# Patient Record
Sex: Male | Born: 1935 | Race: White | Hispanic: No | Marital: Married | Smoking: Former smoker
Health system: Southern US, Community
[De-identification: ages and names within clinical notes are randomized; demographics above are authoritative.]

## PROBLEM LIST (undated history)

## (undated) DIAGNOSIS — R269 Unspecified abnormalities of gait and mobility: Secondary | ICD-10-CM

## (undated) DIAGNOSIS — E538 Deficiency of other specified B group vitamins: Secondary | ICD-10-CM

## (undated) DIAGNOSIS — K2970 Gastritis, unspecified, without bleeding: Secondary | ICD-10-CM

## (undated) DIAGNOSIS — R569 Unspecified convulsions: Secondary | ICD-10-CM

## (undated) DIAGNOSIS — G629 Polyneuropathy, unspecified: Secondary | ICD-10-CM

## (undated) DIAGNOSIS — L259 Unspecified contact dermatitis, unspecified cause: Secondary | ICD-10-CM

## (undated) DIAGNOSIS — G20 Parkinson's disease: Secondary | ICD-10-CM

## (undated) DIAGNOSIS — D126 Benign neoplasm of colon, unspecified: Secondary | ICD-10-CM

## (undated) DIAGNOSIS — M1610 Unilateral primary osteoarthritis, unspecified hip: Secondary | ICD-10-CM

## (undated) DIAGNOSIS — N400 Enlarged prostate without lower urinary tract symptoms: Secondary | ICD-10-CM

## (undated) DIAGNOSIS — I2699 Other pulmonary embolism without acute cor pulmonale: Secondary | ICD-10-CM

## (undated) DIAGNOSIS — K2990 Gastroduodenitis, unspecified, without bleeding: Secondary | ICD-10-CM

## (undated) DIAGNOSIS — K649 Unspecified hemorrhoids: Secondary | ICD-10-CM

## (undated) DIAGNOSIS — I951 Orthostatic hypotension: Secondary | ICD-10-CM

## (undated) DIAGNOSIS — D649 Anemia, unspecified: Secondary | ICD-10-CM

## (undated) DIAGNOSIS — L719 Rosacea, unspecified: Secondary | ICD-10-CM

## (undated) DIAGNOSIS — E559 Vitamin D deficiency, unspecified: Secondary | ICD-10-CM

## (undated) DIAGNOSIS — L97509 Non-pressure chronic ulcer of other part of unspecified foot with unspecified severity: Secondary | ICD-10-CM

## (undated) DIAGNOSIS — M169 Osteoarthritis of hip, unspecified: Secondary | ICD-10-CM

## (undated) DIAGNOSIS — K5900 Constipation, unspecified: Secondary | ICD-10-CM

## (undated) DIAGNOSIS — K219 Gastro-esophageal reflux disease without esophagitis: Secondary | ICD-10-CM

## (undated) DIAGNOSIS — G589 Mononeuropathy, unspecified: Secondary | ICD-10-CM

## (undated) DIAGNOSIS — I10 Essential (primary) hypertension: Secondary | ICD-10-CM

## (undated) DIAGNOSIS — M479 Spondylosis, unspecified: Secondary | ICD-10-CM

## (undated) DIAGNOSIS — E7800 Pure hypercholesterolemia, unspecified: Secondary | ICD-10-CM

## (undated) DIAGNOSIS — R55 Syncope and collapse: Secondary | ICD-10-CM

## (undated) DIAGNOSIS — K5730 Diverticulosis of large intestine without perforation or abscess without bleeding: Secondary | ICD-10-CM

## (undated) HISTORY — DX: Rosacea, unspecified: L71.9

## (undated) HISTORY — DX: Orthostatic hypotension: I95.1

## (undated) HISTORY — DX: Pure hypercholesterolemia, unspecified: E78.00

## (undated) HISTORY — DX: Parkinson's disease: G20

## (undated) HISTORY — DX: Other pulmonary embolism without acute cor pulmonale: I26.99

## (undated) HISTORY — DX: Unspecified contact dermatitis, unspecified cause: L25.9

## (undated) HISTORY — DX: Benign neoplasm of colon, unspecified: D12.6

## (undated) HISTORY — DX: Unilateral primary osteoarthritis, unspecified hip: M16.10

## (undated) HISTORY — DX: Polyneuropathy, unspecified: G62.9

## (undated) HISTORY — DX: Essential (primary) hypertension: I10

## (undated) HISTORY — DX: Gastro-esophageal reflux disease without esophagitis: K21.9

## (undated) HISTORY — DX: Unspecified hemorrhoids: K64.9

## (undated) HISTORY — DX: Gastroduodenitis, unspecified, without bleeding: K29.90

## (undated) HISTORY — DX: Gastritis, unspecified, without bleeding: K29.70

## (undated) HISTORY — DX: Unspecified convulsions: R56.9

## (undated) HISTORY — DX: Diverticulosis of large intestine without perforation or abscess without bleeding: K57.30

## (undated) HISTORY — DX: Spondylosis, unspecified: M47.9

## (undated) HISTORY — DX: Non-pressure chronic ulcer of other part of unspecified foot with unspecified severity: L97.509

## (undated) HISTORY — DX: Unspecified abnormalities of gait and mobility: R26.9

## (undated) HISTORY — DX: Deficiency of other specified B group vitamins: E53.8

## (undated) HISTORY — DX: Vitamin D deficiency, unspecified: E55.9

## (undated) HISTORY — DX: Benign prostatic hyperplasia without lower urinary tract symptoms: N40.0

## (undated) HISTORY — DX: Constipation, unspecified: K59.00

## (undated) HISTORY — DX: Osteoarthritis of hip, unspecified: M16.9

## (undated) HISTORY — DX: Anemia, unspecified: D64.9

## (undated) HISTORY — DX: Mononeuropathy, unspecified: G58.9

## (undated) HISTORY — DX: Syncope and collapse: R55

---
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
## 1963-08-21 HISTORY — PX: OTHER SURGICAL HISTORY: SHX169

## 2001-08-20 HISTORY — PX: CHOLECYSTECTOMY, LAPAROSCOPIC: SHX56

## 2006-08-29 DIAGNOSIS — K2970 Gastritis, unspecified, without bleeding: Secondary | ICD-10-CM

## 2006-08-29 DIAGNOSIS — K2990 Gastroduodenitis, unspecified, without bleeding: Secondary | ICD-10-CM

## 2006-08-29 HISTORY — PX: COLONOSCOPY: SHX174

## 2006-08-29 HISTORY — DX: Gastritis, unspecified, without bleeding: K29.70

## 2006-08-29 HISTORY — DX: Gastritis, unspecified, without bleeding: K29.90

## 2011-12-14 DIAGNOSIS — H43819 Vitreous degeneration, unspecified eye: Secondary | ICD-10-CM | POA: Diagnosis not present

## 2011-12-14 DIAGNOSIS — H2510 Age-related nuclear cataract, unspecified eye: Secondary | ICD-10-CM | POA: Diagnosis not present

## 2012-01-01 DIAGNOSIS — K219 Gastro-esophageal reflux disease without esophagitis: Secondary | ICD-10-CM | POA: Diagnosis not present

## 2012-01-01 DIAGNOSIS — E7800 Pure hypercholesterolemia, unspecified: Secondary | ICD-10-CM | POA: Diagnosis not present

## 2012-01-01 DIAGNOSIS — D126 Benign neoplasm of colon, unspecified: Secondary | ICD-10-CM | POA: Diagnosis not present

## 2012-01-01 DIAGNOSIS — Z01818 Encounter for other preprocedural examination: Secondary | ICD-10-CM | POA: Diagnosis not present

## 2012-01-02 DIAGNOSIS — H2510 Age-related nuclear cataract, unspecified eye: Secondary | ICD-10-CM | POA: Diagnosis not present

## 2012-01-07 DIAGNOSIS — H25049 Posterior subcapsular polar age-related cataract, unspecified eye: Secondary | ICD-10-CM | POA: Diagnosis not present

## 2012-01-07 DIAGNOSIS — H2510 Age-related nuclear cataract, unspecified eye: Secondary | ICD-10-CM | POA: Diagnosis not present

## 2012-01-07 DIAGNOSIS — H25019 Cortical age-related cataract, unspecified eye: Secondary | ICD-10-CM | POA: Diagnosis not present

## 2012-01-07 HISTORY — PX: CATARACT EXTRACTION W/ INTRAOCULAR LENS IMPLANT: SHX1309

## 2012-01-29 DIAGNOSIS — G589 Mononeuropathy, unspecified: Secondary | ICD-10-CM | POA: Diagnosis not present

## 2012-01-29 DIAGNOSIS — K2970 Gastritis, unspecified, without bleeding: Secondary | ICD-10-CM | POA: Diagnosis not present

## 2012-01-29 DIAGNOSIS — E7800 Pure hypercholesterolemia, unspecified: Secondary | ICD-10-CM | POA: Diagnosis not present

## 2012-01-29 DIAGNOSIS — E559 Vitamin D deficiency, unspecified: Secondary | ICD-10-CM | POA: Diagnosis not present

## 2012-01-29 DIAGNOSIS — Z23 Encounter for immunization: Secondary | ICD-10-CM | POA: Diagnosis not present

## 2012-01-30 DIAGNOSIS — R7989 Other specified abnormal findings of blood chemistry: Secondary | ICD-10-CM | POA: Diagnosis not present

## 2012-01-30 DIAGNOSIS — G589 Mononeuropathy, unspecified: Secondary | ICD-10-CM | POA: Diagnosis not present

## 2012-01-30 DIAGNOSIS — E7800 Pure hypercholesterolemia, unspecified: Secondary | ICD-10-CM | POA: Diagnosis not present

## 2012-01-30 DIAGNOSIS — E559 Vitamin D deficiency, unspecified: Secondary | ICD-10-CM | POA: Diagnosis not present

## 2012-04-30 DIAGNOSIS — G609 Hereditary and idiopathic neuropathy, unspecified: Secondary | ICD-10-CM | POA: Diagnosis not present

## 2012-05-01 DIAGNOSIS — I669 Occlusion and stenosis of unspecified cerebral artery: Secondary | ICD-10-CM | POA: Diagnosis not present

## 2012-05-01 DIAGNOSIS — G609 Hereditary and idiopathic neuropathy, unspecified: Secondary | ICD-10-CM | POA: Diagnosis not present

## 2012-05-05 DIAGNOSIS — D235 Other benign neoplasm of skin of trunk: Secondary | ICD-10-CM | POA: Diagnosis not present

## 2012-05-23 DIAGNOSIS — H2510 Age-related nuclear cataract, unspecified eye: Secondary | ICD-10-CM | POA: Diagnosis not present

## 2012-06-02 DIAGNOSIS — G609 Hereditary and idiopathic neuropathy, unspecified: Secondary | ICD-10-CM | POA: Diagnosis not present

## 2012-06-06 DIAGNOSIS — Z23 Encounter for immunization: Secondary | ICD-10-CM | POA: Diagnosis not present

## 2012-07-07 DIAGNOSIS — G609 Hereditary and idiopathic neuropathy, unspecified: Secondary | ICD-10-CM | POA: Diagnosis not present

## 2013-01-20 DIAGNOSIS — R030 Elevated blood-pressure reading, without diagnosis of hypertension: Secondary | ICD-10-CM | POA: Diagnosis not present

## 2013-01-20 DIAGNOSIS — R55 Syncope and collapse: Secondary | ICD-10-CM | POA: Diagnosis not present

## 2013-01-20 DIAGNOSIS — G589 Mononeuropathy, unspecified: Secondary | ICD-10-CM | POA: Diagnosis not present

## 2013-01-20 DIAGNOSIS — E559 Vitamin D deficiency, unspecified: Secondary | ICD-10-CM | POA: Diagnosis not present

## 2013-01-21 DIAGNOSIS — G589 Mononeuropathy, unspecified: Secondary | ICD-10-CM | POA: Diagnosis not present

## 2013-01-21 DIAGNOSIS — D649 Anemia, unspecified: Secondary | ICD-10-CM | POA: Diagnosis not present

## 2013-01-21 DIAGNOSIS — E559 Vitamin D deficiency, unspecified: Secondary | ICD-10-CM | POA: Diagnosis not present

## 2013-01-21 DIAGNOSIS — E7800 Pure hypercholesterolemia, unspecified: Secondary | ICD-10-CM | POA: Diagnosis not present

## 2013-01-21 DIAGNOSIS — R55 Syncope and collapse: Secondary | ICD-10-CM | POA: Diagnosis not present

## 2013-01-23 DIAGNOSIS — Z130 Encounter for screening for diseases of the blood and blood-forming organs and certain disorders involving the immune mechanism: Secondary | ICD-10-CM | POA: Diagnosis not present

## 2013-01-28 DIAGNOSIS — R55 Syncope and collapse: Secondary | ICD-10-CM | POA: Diagnosis not present

## 2013-01-28 DIAGNOSIS — I6789 Other cerebrovascular disease: Secondary | ICD-10-CM | POA: Diagnosis not present

## 2013-02-24 DIAGNOSIS — R030 Elevated blood-pressure reading, without diagnosis of hypertension: Secondary | ICD-10-CM | POA: Diagnosis not present

## 2013-02-24 DIAGNOSIS — R42 Dizziness and giddiness: Secondary | ICD-10-CM | POA: Diagnosis not present

## 2013-04-02 DIAGNOSIS — D649 Anemia, unspecified: Secondary | ICD-10-CM | POA: Diagnosis not present

## 2013-04-02 DIAGNOSIS — E559 Vitamin D deficiency, unspecified: Secondary | ICD-10-CM | POA: Diagnosis not present

## 2013-04-16 DIAGNOSIS — R269 Unspecified abnormalities of gait and mobility: Secondary | ICD-10-CM | POA: Diagnosis not present

## 2013-04-16 DIAGNOSIS — G609 Hereditary and idiopathic neuropathy, unspecified: Secondary | ICD-10-CM | POA: Diagnosis not present

## 2013-04-21 DIAGNOSIS — Z23 Encounter for immunization: Secondary | ICD-10-CM | POA: Diagnosis not present

## 2013-05-25 DIAGNOSIS — H2510 Age-related nuclear cataract, unspecified eye: Secondary | ICD-10-CM | POA: Diagnosis not present

## 2013-05-25 DIAGNOSIS — H26499 Other secondary cataract, unspecified eye: Secondary | ICD-10-CM | POA: Diagnosis not present

## 2013-05-25 DIAGNOSIS — H43819 Vitreous degeneration, unspecified eye: Secondary | ICD-10-CM | POA: Diagnosis not present

## 2013-05-28 DIAGNOSIS — R030 Elevated blood-pressure reading, without diagnosis of hypertension: Secondary | ICD-10-CM | POA: Diagnosis not present

## 2013-05-28 DIAGNOSIS — G589 Mononeuropathy, unspecified: Secondary | ICD-10-CM | POA: Diagnosis not present

## 2013-05-28 DIAGNOSIS — K219 Gastro-esophageal reflux disease without esophagitis: Secondary | ICD-10-CM | POA: Diagnosis not present

## 2013-06-16 DIAGNOSIS — R269 Unspecified abnormalities of gait and mobility: Secondary | ICD-10-CM | POA: Diagnosis not present

## 2013-06-16 DIAGNOSIS — G609 Hereditary and idiopathic neuropathy, unspecified: Secondary | ICD-10-CM | POA: Diagnosis not present

## 2014-01-20 ENCOUNTER — Ambulatory Visit: Payer: Medicare Other | Admitting: Nurse Practitioner

## 2014-02-02 ENCOUNTER — Encounter: Payer: Self-pay | Admitting: Internal Medicine

## 2014-02-02 ENCOUNTER — Non-Acute Institutional Stay: Payer: Medicare Other | Admitting: Internal Medicine

## 2014-02-02 VITALS — BP 140/76 | HR 65 | Temp 99.2°F | Ht 67.5 in | Wt 144.0 lb

## 2014-02-02 DIAGNOSIS — I10 Essential (primary) hypertension: Secondary | ICD-10-CM | POA: Diagnosis not present

## 2014-02-02 DIAGNOSIS — E538 Deficiency of other specified B group vitamins: Secondary | ICD-10-CM

## 2014-02-02 DIAGNOSIS — D126 Benign neoplasm of colon, unspecified: Secondary | ICD-10-CM

## 2014-02-02 DIAGNOSIS — K219 Gastro-esophageal reflux disease without esophagitis: Secondary | ICD-10-CM | POA: Diagnosis not present

## 2014-02-02 DIAGNOSIS — E7800 Pure hypercholesterolemia, unspecified: Secondary | ICD-10-CM | POA: Insufficient documentation

## 2014-02-02 DIAGNOSIS — G629 Polyneuropathy, unspecified: Secondary | ICD-10-CM | POA: Insufficient documentation

## 2014-02-02 DIAGNOSIS — D649 Anemia, unspecified: Secondary | ICD-10-CM | POA: Insufficient documentation

## 2014-02-02 DIAGNOSIS — G20 Parkinson's disease: Secondary | ICD-10-CM | POA: Insufficient documentation

## 2014-02-02 DIAGNOSIS — K5900 Constipation, unspecified: Secondary | ICD-10-CM

## 2014-02-02 DIAGNOSIS — G609 Hereditary and idiopathic neuropathy, unspecified: Secondary | ICD-10-CM | POA: Diagnosis not present

## 2014-02-02 DIAGNOSIS — R259 Unspecified abnormal involuntary movements: Secondary | ICD-10-CM

## 2014-02-02 DIAGNOSIS — N400 Enlarged prostate without lower urinary tract symptoms: Secondary | ICD-10-CM | POA: Diagnosis not present

## 2014-02-02 DIAGNOSIS — E559 Vitamin D deficiency, unspecified: Secondary | ICD-10-CM | POA: Insufficient documentation

## 2014-02-02 NOTE — Progress Notes (Signed)
Patient ID: Lawrence Turner, male   DOB: [birth year], 80 y.o.   MRN: [id number]    Location:  [facility] , independent  Place of Service: Clinic [phone])  Extended Emergency Contact Information Primary Emergency Contact: Lawrence Turner Address: [address]           [place],  [address] Lawrence Turner of [place] Phone: [phone] Relation: Spouse   Chief Complaint  Patient presents with  . Medical Management of Chronic Issues    New Patient, just moved to [facility] 01/2014 from [place] with wife.  . Peripheral Neuropathy    has some concerns, would like to talk with Dr. Nyoka Turner about    HPI:  Unspecified essential hypertension: Under control at the present time. No medications. Does not follow any particular diet.  Esophageal reflux: Occasional indigestion. Using pantoprazole daily.  Peripheral neuropathy: Symptoms present since 2000. He has been told by multiple doctors that there is no particular treatment for this. Patient did have peripheral nerve conduction velocity testing done 12/06/11 it showed sensory and motor neuropathy. He has been tried on Lyrica. He stopped this after several months because of feeling "wiped out". Gabapentin this a little bit effective. He is on a low dose of this. He also uses 3-5 breakthrough both triglycerides and because it was mentioned in the study as possibly helping diabetic neuropathy. Patient is not diabetic. Patient felt a neuropathy might now be affecting his handwriting. He seems to be losing his motor skills. His feet are painful virtually no time. Hands feel a bit numb, but are not painful.  Hypertrophy of prostate without urinary obstruction and other lower urinary tract symptoms (LUTS): Denies any problems with hesitation or dribbling.  Other B-complex deficiencies: Patient reports a low B12. He is unaware whether he ever had lab work done for this. He takes B12 supplements orally.  Anemia, unspecified: Remote  history.  Unspecified vitamin D deficiency: Patient supplementing with vitamin D.  Pure hypercholesterolemia: Using fenofibrate for elevated triglycerides.  Benign neoplasm of colon: History of polyp removal in 2005. Patient was told he should have followup colonoscopy but has never had this done. He does not want to do this at the present time because of stresses involved with this move to [place].  Unspecified constipation: Mild to moderate. He has been using MiraLax. He does not find this particularly satisfactory.  Unstable gait: Patient says he is feeling much less steady when walking. He denies any falls.    Past Medical History  Diagnosis Date  . Mononeuritis of unspecified site   . Unspecified constipation   . Unspecified hemorrhoids without mention of complication   . Benign neoplasm of colon     pre-cancerous polyps removed at colonoscopy 2005  . Pure hypercholesterolemia   . Unspecified vitamin D deficiency   . Unspecified gastritis and gastroduodenitis without mention of hemorrhage   . Other pulmonary embolism and infarction     following cholecystectomy   . Anemia, unspecified   . Hypertrophy of prostate without urinary obstruction and other lower urinary tract symptoms (LUTS)   . Other B-complex deficiencies   . 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 Rosacea   . Osteoarthrosis, unspecified whether generalized or localized, pelvic region and thigh   . Diverticulosis of colon (without mention of hemorrhage)   . Contact dermatitis and other eczema, due to unspecified cause   . Esophageal reflux   . Unspecified essential hypertension   . Peripheral neuropathy  Past Surgical History  Procedure Laterality Date  . Hemorrhoid surgery  1953  . Cholecystectomy, laparoscopic  2003  . Cataract extraction w/ intraocular lens implant Left 5/20//2013  . Colonic polyp removal  1965  . Colonoscopy  08/29/2006    hemorrhoids, 3 small polyps  (hyperplastic) & diverticulosis    History   Social History  . Marital Status: Married    Spouse Name: N/A    Number of Children: N/A  . Years of Education: N/A   Occupational History  . retired Advertising account executive    Social History Main Topics  . Smoking status: Former Smoker -- 15 years    Types: Cigarettes    Quit date: 01/30/1963  . Smokeless tobacco: Never Used  . Alcohol Use: 1.2 oz/week    1 Glasses of wine, 1 Cans of beer per week     Comment: 14 per week  . Drug Use: No  . Sexual Activity: Not on file   Other Topics Concern  . Not on file   Social History Narrative   Lives at [facility] since 01/20/2014   Married Lawrence Turner   Former smoker stopped 1964   Alcohlol 14 drinks per week   Exercise walks daily   Living Will, POA              reports that he quit smoking about 51 years ago. His smoking use included Cigarettes. He smoked 0.00 packs per day for 15 years. He has never used smokeless tobacco. He reports that he drinks about 1.2 ounces of alcohol per week. He reports that he does not use illicit drugs.  Immunization History  Administered Date(s) Administered  . DTaP 09/01/2007  . Pneumococcal Polysaccharide-23 06/11/2002    Allergies  Allergen Reactions  . Lyrica [Pregabalin]     *Anticonvulsants*, fatique    Medications: Patient's Medications  New Prescriptions   No medications on file  Previous Medications   ACETAMINOPHEN (TYLENOL) 325 MG TABLET    Take 650 mg by mouth. Take one tablet as needed   FENOFIBRATE (TRICOR) 48 MG TABLET    Take 48 mg by mouth daily. For cholesterol   GABAPENTIN (NEURONTIN) 100 MG CAPSULE    Take 100 mg by mouth. Take one capsule at night for pain   MULTIPLE VITAMIN (MULTIVITAMIN) TABLET    Take 1 tablet by mouth daily.   PANTOPRAZOLE (PROTONIX) 40 MG TABLET    Take 40 mg by mouth daily.   VITAMIN B-12 (CYANOCOBALAMIN) 1000 MCG TABLET    Take 1,000 mcg by mouth daily.  Modified Medications   No medications on file    Discontinued Medications   No medications on file     Review of Systems  Constitutional: Positive for fatigue. Negative for fever, chills, diaphoresis, activity change, appetite change and unexpected weight change.  HENT: Negative for congestion, dental problem, ear pain, hearing loss, mouth sores and rhinorrhea.   Eyes: Negative for photophobia, pain, discharge, redness and visual disturbance.  Respiratory: Negative for apnea, cough, choking, chest tightness, shortness of breath and wheezing.   Cardiovascular: Negative for chest pain, palpitations and leg swelling.  Gastrointestinal: Positive for constipation. Negative for nausea, abdominal pain, diarrhea, blood in stool and abdominal distention.  Endocrine: Negative.   Genitourinary: Negative.   Musculoskeletal: Positive for gait problem. Negative for arthralgias, back pain, joint swelling, myalgias, neck pain and neck stiffness.  Skin:       Multiple seborrheic keratoses  Allergic/Immunologic: Negative.   Neurological: Positive for weakness and numbness.  Negative for tremors, seizures, syncope and headaches.       Complaint of loss of motor skills and changes in. He is unstable with walking. He has a known peripheral neuropathy of uncertain origin.  Hematological:       History of anemia  Psychiatric/Behavioral: Positive for dysphoric mood. The patient is nervous/anxious.     Filed Vitals:   02/02/14 0836  BP: 140/76  Pulse: 65  Temp: 99.2 F (37.3 C)  TempSrc: Tympanic  Height: 5' 7.5" (1.715 m)  Weight: 144 lb (65.318 kg)  SpO2: 97%   Body mass index is 22.21 kg/(m^2).  Physical Exam  Constitutional: He is oriented to person, place, and time.  Thin. Elderly.  HENT:  Right Ear: External ear normal.  Left Ear: External ear normal.  Nose: Nose normal.  Mouth/Throat: Oropharynx is clear and moist. No oropharyngeal exudate.  Eyes: Conjunctivae are normal. Pupils are equal, round, and reactive to light.  Neck: No JVD  present. No tracheal deviation present. No thyromegaly present.  Cardiovascular: Normal rate, regular rhythm, normal heart sounds and intact distal pulses.  Exam reveals no gallop and no friction rub.   No murmur heard. Pulmonary/Chest: No respiratory distress. He has no wheezes. He has no rales. He exhibits no tenderness.  Abdominal: He exhibits no distension and no mass. There is no tenderness.  Musculoskeletal: Normal range of motion. He exhibits no edema and no tenderness.  Lymphadenopathy:    He has no cervical adenopathy.  Neurological: He is alert and oriented to person, place, and time. He displays abnormal reflex (Absent at both knees). No cranial nerve deficit. Coordination normal.  Cogwheeling most evident in the right wrist. Gait is mildly abnormal with a suggestion of a festinating gait. Speech is soft and mildly pressured. There is no tremor. There is no focal weakness. 1/14//15 MMSE 30/30. Passed clock drawing. Test.  Skin: No rash noted. No erythema. No pallor.  Psychiatric:  Patient appears mildly anxious and perhaps depressed. When talking about the recent move from [place] to [place], he seems overwhelmed.     Labs reviewed: No results found for any previous visit.    Assessment/Plan  1. Unspecified essential hypertension Controlled. Not currently on medication  2. Esophageal reflux Controlled on pantoprazole  3. Peripheral neuropathy I reaffirmed that there is no treatment that will cure peripheral neuropathy. If symptoms become worse, we can increase his gabapentin or try Cymbalta.  4. Hypertrophy of prostate without urinary obstruction and other lower urinary tract symptoms (LUTS) Patient gives a history of a previous prostate biopsy. He is to have PSA followed regularly, but doesn't want to do that now. Stable  5. Other B-complex deficiencies Check B12 prior to next visit  6. Anemia, unspecified Check CBC prior to next visit  7. Unspecified  vitamin D deficiency Continue vitamin D  8. Pure hypercholesterolemia Check lipid panel prior to next visit  9. Benign neoplasm of colon Discussed referral to gastroenterology, but he does not want to do this at this time.  10. Unspecified constipation Recommended Senokot 2 tablets nightly  11. Parkinsonian features Continue to observe.

## 2014-05-24 DIAGNOSIS — I10 Essential (primary) hypertension: Secondary | ICD-10-CM | POA: Diagnosis not present

## 2014-05-24 DIAGNOSIS — E780 Pure hypercholesterolemia: Secondary | ICD-10-CM | POA: Diagnosis not present

## 2014-05-24 DIAGNOSIS — E539 Vitamin B deficiency, unspecified: Secondary | ICD-10-CM | POA: Diagnosis not present

## 2014-05-24 DIAGNOSIS — D649 Anemia, unspecified: Secondary | ICD-10-CM | POA: Diagnosis not present

## 2014-05-24 DIAGNOSIS — D519 Vitamin B12 deficiency anemia, unspecified: Secondary | ICD-10-CM | POA: Diagnosis not present

## 2014-05-24 LAB — BASIC METABOLIC PANEL
BUN: 15 mg/dL (ref 4–21)
CREATININE: 1.2 mg/dL (ref 0.6–1.3)
Glucose: 90 mg/dL
Potassium: 4.2 mmol/L (ref 3.4–5.3)
Sodium: 138 mmol/L (ref 137–147)

## 2014-05-24 LAB — CBC AND DIFFERENTIAL
HCT: 35 % — AB (ref 41–53)
Hemoglobin: 12.1 g/dL — AB (ref 13.5–17.5)
Platelets: 156 10*3/uL (ref 150–399)
WBC: 5.2 10*3/mL

## 2014-05-24 LAB — LIPID PANEL
Cholesterol: 166 mg/dL (ref 0–200)
HDL: 45 mg/dL (ref 35–70)
LDL CALC: 110 mg/dL
LDl/HDL Ratio: 3.7
TRIGLYCERIDES: 54 mg/dL (ref 40–160)

## 2014-05-24 LAB — HEPATIC FUNCTION PANEL
ALK PHOS: 48 U/L (ref 25–125)
ALT: 9 U/L — AB (ref 10–40)
AST: 12 U/L — AB (ref 14–40)
BILIRUBIN, TOTAL: 0.4 mg/dL

## 2014-05-24 LAB — TSH: TSH: 3.15 u[IU]/mL (ref 0.41–5.90)

## 2014-06-01 ENCOUNTER — Non-Acute Institutional Stay: Payer: Medicare Other | Admitting: Internal Medicine

## 2014-06-01 ENCOUNTER — Encounter: Payer: Self-pay | Admitting: Internal Medicine

## 2014-06-01 VITALS — BP 160/98 | HR 68 | Temp 98.3°F | Wt 150.0 lb

## 2014-06-01 DIAGNOSIS — R259 Unspecified abnormal involuntary movements: Secondary | ICD-10-CM | POA: Diagnosis not present

## 2014-06-01 DIAGNOSIS — E7800 Pure hypercholesterolemia, unspecified: Secondary | ICD-10-CM

## 2014-06-01 DIAGNOSIS — D649 Anemia, unspecified: Secondary | ICD-10-CM

## 2014-06-01 DIAGNOSIS — G629 Polyneuropathy, unspecified: Secondary | ICD-10-CM

## 2014-06-01 DIAGNOSIS — I10 Essential (primary) hypertension: Secondary | ICD-10-CM

## 2014-06-01 DIAGNOSIS — R29818 Other symptoms and signs involving the nervous system: Secondary | ICD-10-CM

## 2014-06-01 MED ORDER — GABAPENTIN 300 MG PO CAPS: ORAL_CAPSULE | ORAL | Status: DC

## 2014-06-01 NOTE — Progress Notes (Signed)
Patient ID: Lawrence Turner, male   DOB: [birth year], 80 y.o.   MRN: [id number]    [facility]     Place of Service: Clinic (12)    Allergies  Allergen Reactions  . Lyrica [Pregabalin]     *Anticonvulsants*, fatique    Chief Complaint  Patient presents with  . Medical Management of Chronic Issues    blood pressure, anemia, peripheral neuropathy, cholesterol     HPI:  Parkinsonian features: tremor, gait unsteady, speech fast and mumbling, micrographia  Peripheral neuropathy: not getting adequate relief from current low dose of gabapentin  Pure hypercholesterolemia: controlled  Anemia, unspecified anemia type: hgb 12.1.  Normocytic, normochromic  Essential hypertension: elevated SBP. Rechecked 156/90 left arm    Medications: Patient's Medications  New Prescriptions   No medications on file  Previous Medications   ACETAMINOPHEN (TYLENOL) 325 MG TABLET    Take 650 mg by mouth. Take one tablet as needed   FENOFIBRATE (TRICOR) 48 MG TABLET    Take 48 mg by mouth daily. For cholesterol   GABAPENTIN (NEURONTIN) 100 MG CAPSULE    Take 100 mg by mouth. Take one capsule at night for pain   MULTIPLE VITAMIN (MULTIVITAMIN) TABLET    Take 1 tablet by mouth daily.   PANTOPRAZOLE (PROTONIX) 40 MG TABLET    Take 40 mg by mouth daily.   VITAMIN B-12 (CYANOCOBALAMIN) 1000 MCG TABLET    Take 1,000 mcg by mouth daily.  Modified Medications   No medications on file  Discontinued Medications   No medications on file     Review of Systems  Constitutional: Positive for fatigue. Negative for fever, chills, diaphoresis, activity change, appetite change and unexpected weight change.  HENT: Negative for congestion, dental problem, ear pain, hearing loss, mouth sores and rhinorrhea.   Eyes: Negative for photophobia, pain, discharge, redness and visual disturbance.  Respiratory: Negative for apnea, cough, choking, chest tightness, shortness of breath and wheezing.   Cardiovascular:  Negative for chest pain, palpitations and leg swelling.  Gastrointestinal: Positive for constipation. Negative for nausea, abdominal pain, diarrhea, blood in stool and abdominal distention.  Endocrine: Negative.   Genitourinary: Negative.   Musculoskeletal: Positive for gait problem (using cane). Negative for arthralgias, back pain, joint swelling, myalgias, neck pain and neck stiffness.  Skin:       Multiple seborrheic keratoses  Allergic/Immunologic: Negative.   Neurological: Positive for weakness and numbness. Negative for tremors, seizures, syncope and headaches.       Complaint of loss of motor skills and changes in. He is unstable with walking. He has a known peripheral neuropathy of uncertain origin. Micrographia.  Hematological:       History of anemia  Psychiatric/Behavioral: Positive for dysphoric mood. The patient is nervous/anxious.     Filed Vitals:   06/01/14 0912  BP: 160/98  Pulse: 68  Temp: 98.3 F (36.8 C)  TempSrc: Oral  Weight: 150 lb (68.04 kg)   Body mass index is 23.13 kg/(m^2).  Physical Exam  Constitutional: He is oriented to person, place, and time.  Thin. Elderly.  HENT:  Right Ear: External ear normal.  Left Ear: External ear normal.  Nose: Nose normal.  Mouth/Throat: Oropharynx is clear and moist. No oropharyngeal exudate.  Eyes: Conjunctivae are normal. Pupils are equal, round, and reactive to light.  Neck: No JVD present. No tracheal deviation present. No thyromegaly present.  Cardiovascular: Normal rate, regular rhythm, normal heart sounds and intact distal pulses.  Exam reveals no gallop and no  friction rub.   No murmur heard. Pulmonary/Chest: No respiratory distress. He has no wheezes. He has no rales. He exhibits no tenderness.  Abdominal: He exhibits no distension and no mass. There is no tenderness.  Musculoskeletal: Normal range of motion. He exhibits no edema and no tenderness.  uunstable gait  Lymphadenopathy:    He has no cervical  adenopathy.  Neurological: He is alert and oriented to person, place, and time. He displays abnormal reflex (Absent at both knees). No cranial nerve deficit. Coordination normal.  Cogwheeling most evident in the right wrist. Gait is mildly abnormal with a suggestion of a festinating gait. Speech is soft and mildly pressured. There is no tremor. There is no focal weakness. 1/14//15 MMSE 30/30. Passed clock drawing. Test.  Skin: No rash noted. No erythema. No pallor.  Psychiatric:  Patient appears mildly anxious and perhaps depressed. When talking about the recent move from [place] to [place], he seems overwhelmed.     Labs reviewed: Nursing Home on 06/01/2014  Component Date Value Ref Range Status  . Hemoglobin 05/24/2014 12.1* 13.5 - 17.5 g/dL Final  . HCT 05/24/2014 35* 41 - 53 % Final  . Platelets 05/24/2014 156  150 - 399 K/L Final  . WBC 05/24/2014 5.2   Final  . Glucose 05/24/2014 90   Final  . BUN 05/24/2014 15  4 - 21 mg/dL Final  . Creatinine 05/24/2014 1.2  0.6 - 1.3 mg/dL Final  . Potassium 05/24/2014 4.2  3.4 - 5.3 mmol/L Final  . Sodium 05/24/2014 138  137 - 147 mmol/L Final  . LDl/HDL Ratio 05/24/2014 3.7   Final  . Triglycerides 05/24/2014 54  40 - 160 mg/dL Final  . Cholesterol 05/24/2014 166  0 - 200 mg/dL Final  . HDL 05/24/2014 45  35 - 70 mg/dL Final  . LDL Cholesterol 05/24/2014 110   Final  . Alkaline Phosphatase 05/24/2014 48  25 - 125 U/L Final  . ALT 05/24/2014 9* 10 - 40 U/L Final  . AST 05/24/2014 12* 14 - 40 U/L Final  . Bilirubin, Total 05/24/2014 0.4   Final  . TSH 05/24/2014 3.15  0.41 - 5.90 uIU/mL Final     Assessment/Plan  1. Parkinsonian features Lengthy discussion. Will not start medication yet  2. Peripheral neuropathy Increase gabapentin to 300 mg hs  3. Pure hypercholesterolemia controlled  4. Anemia, unspecified anemia type mild  5. Essential hypertension Get home BP. Return in 2 mo.

## 2014-06-05 DIAGNOSIS — Z23 Encounter for immunization: Secondary | ICD-10-CM | POA: Diagnosis not present

## 2014-06-09 ENCOUNTER — Encounter: Payer: Self-pay | Admitting: Internal Medicine

## 2014-07-27 ENCOUNTER — Encounter: Payer: Self-pay | Admitting: Internal Medicine

## 2014-07-27 ENCOUNTER — Non-Acute Institutional Stay: Payer: Medicare Other | Admitting: Internal Medicine

## 2014-07-27 VITALS — BP 150/94 | HR 76 | Temp 97.9°F | Wt 151.0 lb

## 2014-07-27 DIAGNOSIS — I10 Essential (primary) hypertension: Secondary | ICD-10-CM

## 2014-07-27 DIAGNOSIS — R259 Unspecified abnormal involuntary movements: Secondary | ICD-10-CM

## 2014-07-27 DIAGNOSIS — R1310 Dysphagia, unspecified: Secondary | ICD-10-CM | POA: Diagnosis not present

## 2014-07-27 DIAGNOSIS — G629 Polyneuropathy, unspecified: Secondary | ICD-10-CM | POA: Diagnosis not present

## 2014-07-27 NOTE — Progress Notes (Signed)
Patient ID: Lawrence Turner, male   DOB: [birth year], 80 y.o.   MRN: [id number]    [facility]    Place of Service: Clinic (12) OFFICE   Allergies  Allergen Reactions  . Lyrica [Pregabalin]     *Anticonvulsants*, fatique    Chief Complaint  Patient presents with  . Medical Management of Chronic Issues    blood pressure, peripheral neuropathy     HPI:  Increased gabapentin in Oct 2015 for neuropathy, however he did not feel comfortable increasing the gabapentin so this was never carried out. After discussion today, he seemed comfortable trying a higher dose.  Discussed Parkinsonism last visit. Paucity of movement and shuffling gait. Patient says he would like to try drug for parkinsonism.  He is now experiencing some difficulty with swallowing. He denies any pain.  Gait remains quite unstable with a festinating gait and a problem with center of balance  Medications: Patient's Medications  New Prescriptions   No medications on file  Previous Medications   ACETAMINOPHEN (TYLENOL) 325 MG TABLET    Take 650 mg by mouth. Take one tablet as needed   FENOFIBRATE (TRICOR) 48 MG TABLET    Take 48 mg by mouth daily. For cholesterol   GABAPENTIN (NEURONTIN) 300 MG CAPSULE    One each night to help pain from neuropathy   MULTIPLE VITAMIN (MULTIVITAMIN) TABLET    Take 1 tablet by mouth daily.   PANTOPRAZOLE (PROTONIX) 40 MG TABLET    Take 40 mg by mouth daily.   VITAMIN B-12 (CYANOCOBALAMIN) 1000 MCG TABLET    Take 1,000 mcg by mouth daily.  Modified Medications   No medications on file  Discontinued Medications   No medications on file     Review of Systems  Constitutional: Positive for fatigue. Negative for fever, chills, diaphoresis, activity change, appetite change and unexpected weight change.  HENT: Negative for congestion, dental problem, ear pain, hearing loss, mouth sores and rhinorrhea.   Eyes: Negative for photophobia, pain, discharge, redness and visual  disturbance.  Respiratory: Negative for apnea, cough, choking, chest tightness, shortness of breath and wheezing.   Cardiovascular: Negative for chest pain, palpitations and leg swelling.  Gastrointestinal: Positive for constipation. Negative for nausea, abdominal pain, diarrhea, blood in stool and abdominal distention.  Endocrine: Negative.   Genitourinary: Negative.   Musculoskeletal: Positive for gait problem (using cane). Negative for myalgias, back pain, joint swelling, arthralgias, neck pain and neck stiffness.  Skin:       Multiple seborrheic keratoses  Allergic/Immunologic: Negative.   Neurological: Positive for weakness and numbness. Negative for tremors, seizures, syncope and headaches.       Complaint of loss of motor skills and changes in. He is unstable with walking. He has a known peripheral neuropathy of uncertain origin. Micrographia.  Hematological:       History of anemia  Psychiatric/Behavioral: Positive for dysphoric mood. The patient is nervous/anxious.     Filed Vitals:   07/27/14 0937  BP: 150/94  Pulse: 76  Temp: 97.9 F (36.6 C)  TempSrc: Oral  Weight: 151 lb (68.493 kg)   Body mass index is 23.29 kg/(m^2).  Physical Exam  Constitutional: He is oriented to person, place, and time.  Thin. Elderly.  HENT:  Right Ear: External ear normal.  Left Ear: External ear normal.  Nose: Nose normal.  Mouth/Throat: Oropharynx is clear and moist. No oropharyngeal exudate.  Eyes: Conjunctivae are normal. Pupils are equal, round, and reactive to light.  Neck: No JVD  present. No tracheal deviation present. No thyromegaly present.  Cardiovascular: Normal rate, regular rhythm, normal heart sounds and intact distal pulses.  Exam reveals no gallop and no friction rub.   No murmur heard. Pulmonary/Chest: No respiratory distress. He has no wheezes. He has no rales. He exhibits no tenderness.  Abdominal: He exhibits no distension and no mass. There is no tenderness.    Musculoskeletal: Normal range of motion. He exhibits no edema or tenderness.  uunstable gait  Lymphadenopathy:    He has no cervical adenopathy.  Neurological: He is alert and oriented to person, place, and time. He displays abnormal reflex (Absent at both knees). No cranial nerve deficit. Coordination normal.  Cogwheeling most evident in the right wrist. Gait is mildly abnormal with a suggestion of a festinating gait. Speech is soft and mildly pressured. There is no tremor. There is no focal weakness. 1/14//15 MMSE 30/30. Passed clock drawing. Test.  Skin: No rash noted. No erythema. No pallor.  Psychiatric:  Patient appears mildly anxious and perhaps depressed. When talking about the recent move from [place] to [place], he seems overwhelmed.     Labs reviewed: Nursing Home on 06/01/2014  Component Date Value Ref Range Status  . Hemoglobin 05/24/2014 12.1* 13.5 - 17.5 g/dL Final  . HCT 05/24/2014 35* 41 - 53 % Final  . Platelets 05/24/2014 156  150 - 399 K/L Final  . WBC 05/24/2014 5.2   Final  . Glucose 05/24/2014 90   Final  . BUN 05/24/2014 15  4 - 21 mg/dL Final  . Creatinine 05/24/2014 1.2  0.6 - 1.3 mg/dL Final  . Potassium 05/24/2014 4.2  3.4 - 5.3 mmol/L Final  . Sodium 05/24/2014 138  137 - 147 mmol/L Final  . LDl/HDL Ratio 05/24/2014 3.7   Final  . Triglycerides 05/24/2014 54  40 - 160 mg/dL Final  . Cholesterol 05/24/2014 166  0 - 200 mg/dL Final  . HDL 05/24/2014 45  35 - 70 mg/dL Final  . LDL Cholesterol 05/24/2014 110   Final  . Alkaline Phosphatase 05/24/2014 48  25 - 125 U/L Final  . ALT 05/24/2014 9* 10 - 40 U/L Final  . AST 05/24/2014 12* 14 - 40 U/L Final  . Bilirubin, Total 05/24/2014 0.4   Final  . TSH 05/24/2014 3.15  0.41 - 5.90 uIU/mL Final     Assessment/Plan  1. Parkinsonian features - carbidopa-levodopa (SINEMET) 10-100 MG per tablet; Take 1 tablet 3 times daily to help parkinsonism.  Dispense: 100 tablet; Refill: 3  2. Peripheral  neuropathy Increase gabapentin to 300 mg nightly  3. Essential hypertension Mild elevation in both systolic and diastolic pressures today. Consider starting him on Sinemet and he is increasing the dose of gabapentin, hesitate ago.  4. Dysphagia Continue observation. May need barium swallow study in the future.

## 2014-07-29 DIAGNOSIS — R1310 Dysphagia, unspecified: Secondary | ICD-10-CM | POA: Insufficient documentation

## 2014-07-29 MED ORDER — CARBIDOPA-LEVODOPA 10-100 MG PO TABS: ORAL_TABLET | ORAL | Status: DC

## 2014-09-03 DIAGNOSIS — M7742 Metatarsalgia, left foot: Secondary | ICD-10-CM | POA: Diagnosis not present

## 2014-09-03 DIAGNOSIS — G5762 Lesion of plantar nerve, left lower limb: Secondary | ICD-10-CM | POA: Diagnosis not present

## 2014-09-03 DIAGNOSIS — M2042 Other hammer toe(s) (acquired), left foot: Secondary | ICD-10-CM | POA: Diagnosis not present

## 2014-09-03 DIAGNOSIS — M2041 Other hammer toe(s) (acquired), right foot: Secondary | ICD-10-CM | POA: Diagnosis not present

## 2014-10-05 ENCOUNTER — Non-Acute Institutional Stay: Payer: Medicare Other | Admitting: Internal Medicine

## 2014-10-05 ENCOUNTER — Encounter: Payer: Self-pay | Admitting: Internal Medicine

## 2014-10-05 VITALS — BP 142/76 | HR 80 | Temp 97.7°F | Wt 152.0 lb

## 2014-10-05 DIAGNOSIS — I10 Essential (primary) hypertension: Secondary | ICD-10-CM | POA: Diagnosis not present

## 2014-10-05 DIAGNOSIS — R1310 Dysphagia, unspecified: Secondary | ICD-10-CM

## 2014-10-05 DIAGNOSIS — L84 Corns and callosities: Secondary | ICD-10-CM | POA: Insufficient documentation

## 2014-10-05 DIAGNOSIS — K5901 Slow transit constipation: Secondary | ICD-10-CM

## 2014-10-05 DIAGNOSIS — R259 Unspecified abnormal involuntary movements: Secondary | ICD-10-CM | POA: Diagnosis not present

## 2014-10-05 DIAGNOSIS — K5900 Constipation, unspecified: Secondary | ICD-10-CM | POA: Insufficient documentation

## 2014-10-05 MED ORDER — AMANTADINE HCL 100 MG PO CAPS: ORAL_CAPSULE | ORAL | Status: DC

## 2014-10-05 NOTE — Progress Notes (Signed)
Patient ID: Lawrence Turner, male   DOB: 1926-06-26, 81 y.o.   MRN: [id number]    [facility]    Place of Service: [facility] (12) OFFICE   Allergies  Allergen Reactions  . Lyrica [Pregabalin]     *Anticonvulsants*, fatique    Chief Complaint  Patient presents with  . Medical Management of Chronic Issues    blood pressure, Parkinsonian, Peripheral neuropathy. Patient stopped take the Sinemet aftar a week due to lost of balance    HPI:  Parkinsonian features - patient stop Sinemet. He felt it was making him feel more unsteady. He did not fall. The tremor is the same as it was at the last visit. He was not on the Sinemet long enough to determine whether it improves his tremor.  Essential hypertension: Stable and satisfactorily controlled.  Dysphagia: Has trouble swallowing pills  Slow transit constipation: Stool softeners contributed to a sticky stool and he would like to know the name of a laxity without a stool softer.  Corn: Tip of the left third toe has a buildup of record material right at the edge of the nail. It is uncomfortable.    Medications: Patient's Medications  New Prescriptions   No medications on file  Previous Medications   ACETAMINOPHEN (TYLENOL) 325 MG TABLET    Take 650 mg by mouth. Take one tablet as needed   CARBIDOPA-LEVODOPA (SINEMET) 10-100 MG PER TABLET    Take 1 tablet 3 times daily to help parkinsonism.   DOCUSATE SODIUM (COLACE) 100 MG CAPSULE    Take 100 mg by mouth. Take one dail for stool softener   FENOFIBRATE (TRICOR) 48 MG TABLET    Take 48 mg by mouth daily. For cholesterol   GABAPENTIN (NEURONTIN) 300 MG CAPSULE    One each night to help pain from neuropathy   MULTIPLE VITAMIN (MULTIVITAMIN) TABLET    Take 1 tablet by mouth daily.   PANTOPRAZOLE (PROTONIX) 40 MG TABLET    Take 40 mg by mouth daily.   VITAMIN B-12 (CYANOCOBALAMIN) 1000 MCG TABLET    Take 1,000 mcg by mouth daily.  Modified Medications   No medications on file    Discontinued Medications   No medications on file     Review of Systems  Constitutional: Positive for fatigue. Negative for fever, chills, diaphoresis, activity change, appetite change and unexpected weight change.  HENT: Negative for congestion, dental problem, ear pain, hearing loss, mouth sores and rhinorrhea.   Eyes: Negative for photophobia, pain, discharge, redness and visual disturbance.  Respiratory: Negative for apnea, cough, choking, chest tightness, shortness of breath and wheezing.   Cardiovascular: Negative for chest pain, palpitations and leg swelling.  Gastrointestinal: Positive for constipation. Negative for nausea, abdominal pain, diarrhea, blood in stool and abdominal distention.  Endocrine: Negative.   Genitourinary: Negative.   Musculoskeletal: Positive for gait problem (using cane). Negative for myalgias, back pain, joint swelling, arthralgias, neck pain and neck stiffness.  Skin:       Multiple seborrheic keratoses  Allergic/Immunologic: Negative.   Neurological: Positive for weakness and numbness. Negative for tremors, seizures, syncope and headaches.       Complaint of loss of motor skills and changes in. He is unstable with walking. He has a known peripheral neuropathy of uncertain origin. Micrographia.  Hematological:       History of anemia  Psychiatric/Behavioral: Positive for dysphoric mood. The patient is nervous/anxious.     Filed Vitals:   10/05/14 1001  BP: 142/76  Pulse:  80  Temp: 97.7 F (36.5 C)  TempSrc: Oral  Weight: 152 lb (68.947 kg)   Body mass index is 23.44 kg/(m^2).  Physical Exam  Constitutional: He is oriented to person, place, and time.  Thin. Elderly.  HENT:  Right Ear: External ear normal.  Left Ear: External ear normal.  Nose: Nose normal.  Mouth/Throat: Oropharynx is clear and moist. No oropharyngeal exudate.  Eyes: Conjunctivae are normal. Pupils are equal, round, and reactive to light.  Neck: No JVD present. No  tracheal deviation present. No thyromegaly present.  Cardiovascular: Normal rate, regular rhythm, normal heart sounds and intact distal pulses.  Exam reveals no gallop and no friction rub.   No murmur heard. Pulmonary/Chest: No respiratory distress. He has no wheezes. He has no rales. He exhibits no tenderness.  Abdominal: He exhibits no distension and no mass. There is no tenderness.  Musculoskeletal: Normal range of motion. He exhibits no edema or tenderness.  uunstable gait  Lymphadenopathy:    He has no cervical adenopathy.  Neurological: He is alert and oriented to person, place, and time. He displays abnormal reflex (Absent at both knees). No cranial nerve deficit. Coordination normal.  Cogwheeling most evident in the right wrist. Gait is mildly abnormal with a suggestion of a festinating gait. Speech is soft and mildly pressured. There is no tremor. There is no focal weakness. 1/14//15 MMSE 30/30. Passed clock drawing. Test.  Skin: No rash noted. No erythema. No pallor.  Corn at the tip of the left middle toe. It actually goes under the nail a little bit.  Psychiatric:  Patient appears mildly anxious and perhaps depressed. When talking about the recent move from [place] to [place], he seems overwhelmed.     Labs reviewed: No visits with results within 3 Month(s) from this visit. Latest known visit with results is:  Nursing Home on 06/01/2014  Component Date Value Ref Range Status  . Hemoglobin 05/24/2014 12.1* 13.5 - 17.5 g/dL Final  . HCT 05/24/2014 35* 41 - 53 % Final  . Platelets 05/24/2014 156  150 - 399 K/L Final  . WBC 05/24/2014 5.2   Final  . Glucose 05/24/2014 90   Final  . BUN 05/24/2014 15  4 - 21 mg/dL Final  . Creatinine 05/24/2014 1.2  0.6 - 1.3 mg/dL Final  . Potassium 05/24/2014 4.2  3.4 - 5.3 mmol/L Final  . Sodium 05/24/2014 138  137 - 147 mmol/L Final  . LDl/HDL Ratio 05/24/2014 3.7   Final  . Triglycerides 05/24/2014 54  40 - 160 mg/dL Final  .  Cholesterol 05/24/2014 166  0 - 200 mg/dL Final  . HDL 05/24/2014 45  35 - 70 mg/dL Final  . LDL Cholesterol 05/24/2014 110   Final  . Alkaline Phosphatase 05/24/2014 48  25 - 125 U/L Final  . ALT 05/24/2014 9* 10 - 40 U/L Final  . AST 05/24/2014 12* 14 - 40 U/L Final  . Bilirubin, Total 05/24/2014 0.4   Final  . TSH 05/24/2014 3.15  0.41 - 5.90 uIU/mL Final     Assessment/Plan  1. Parkinsonian features Discontinued Sinemet due to patient's perception of his making him more unstable on his feet. Will try a new drug. - amantadine (SYMMETREL) 100 MG capsule; One twice daily to help reduce tremor  Dispense: 60 capsule; Refill: 5  2. Essential hypertension 12  3. Dysphagia Unchanged  4. Slow transit constipation Try either Dulcolax tablets for Senokot  5. Corn Debrided the corn. No bleeding. Some  relief of discomfort.

## 2014-11-16 ENCOUNTER — Encounter: Payer: Self-pay | Admitting: Internal Medicine

## 2014-11-16 ENCOUNTER — Non-Acute Institutional Stay: Payer: Medicare Other | Admitting: Internal Medicine

## 2014-11-16 VITALS — BP 132/70 | HR 76 | Temp 98.0°F | Wt 151.0 lb

## 2014-11-16 DIAGNOSIS — R259 Unspecified abnormal involuntary movements: Secondary | ICD-10-CM | POA: Diagnosis not present

## 2014-11-16 DIAGNOSIS — G629 Polyneuropathy, unspecified: Secondary | ICD-10-CM

## 2014-11-16 DIAGNOSIS — I10 Essential (primary) hypertension: Secondary | ICD-10-CM

## 2014-11-16 NOTE — Progress Notes (Signed)
Patient ID: Lawrence Turner, male   DOB: 20-Nov-1925, 81 y.o.   MRN: [id number]    [facility]     Place of Service: Clinic (12)    Allergies  Allergen Reactions  . Lyrica [Pregabalin]     *Anticonvulsants*, fatique    Chief Complaint  Patient presents with  . Medical Management of Chronic Issues    6 week follow up on Parkinson's after starting Amantadine. Took it for 4-5 days then stopped it, made him tired, just set around .  Marland Kitchen Constipation    slight better, hasn't taken Colace for several days    HPI:  Amantidinr left him feeling lethargic. Sinemet affected his balance.  Constipation is responding to an enema. He stopped the Colace. Continues with psyllium.  Producing an excessive amount of saliva.   Felling weaker in the quadriceps and having  Tougher time getting out of a chair. Very unstable gait, but no falls.  Medications: Patient's Medications  New Prescriptions   No medications on file  Previous Medications   ACETAMINOPHEN (TYLENOL) 325 MG TABLET    Take 650 mg by mouth. Take one tablet as needed   AMANTADINE (SYMMETREL) 100 MG CAPSULE    One twice daily to help reduce tremor   DOCUSATE SODIUM (COLACE) 100 MG CAPSULE    Take 100 mg by mouth. Take one dail for stool softener   FENOFIBRATE (TRICOR) 48 MG TABLET    Take 48 mg by mouth daily. For cholesterol   GABAPENTIN (NEURONTIN) 300 MG CAPSULE    One each night to help pain from neuropathy   MULTIPLE VITAMIN (MULTIVITAMIN) TABLET    Take 1 tablet by mouth daily.   PANTOPRAZOLE (PROTONIX) 40 MG TABLET    Take 40 mg by mouth daily.   VITAMIN B-12 (CYANOCOBALAMIN) 1000 MCG TABLET    Take 1,000 mcg by mouth daily.  Modified Medications   No medications on file  Discontinued Medications   No medications on file     Review of Systems  Constitutional: Positive for fatigue. Negative for fever, chills, diaphoresis, activity change, appetite change and unexpected weight change.  HENT: Negative for  congestion, dental problem, ear pain, hearing loss, mouth sores and rhinorrhea.   Eyes: Negative for photophobia, pain, discharge, redness and visual disturbance.  Respiratory: Negative for apnea, cough, choking, chest tightness, shortness of breath and wheezing.   Cardiovascular: Negative for chest pain, palpitations and leg swelling.  Gastrointestinal: Positive for constipation. Negative for nausea, abdominal pain, diarrhea, blood in stool and abdominal distention.  Endocrine: Negative.   Genitourinary: Negative.   Musculoskeletal: Positive for gait problem (using cane). Negative for myalgias, back pain, joint swelling, arthralgias, neck pain and neck stiffness.  Skin:       Multiple seborrheic keratoses  Allergic/Immunologic: Negative.   Neurological: Positive for weakness and numbness. Negative for tremors, seizures, syncope and headaches.       Complaint of loss of motor skills and changes in. He is unstable with walking. He has a known peripheral neuropathy of uncertain origin. Micrographia.  Hematological:       History of anemia  Psychiatric/Behavioral: Positive for dysphoric mood. The patient is nervous/anxious.     Filed Vitals:   11/16/14 0836  BP: 132/70  Pulse: 76  Temp: 98 F (36.7 C)  TempSrc: Oral  Weight: 151 lb (68.493 kg)  SpO2: 95%   Body mass index is 23.29 kg/(m^2).  Physical Exam  Constitutional: He is oriented to person, place, and time.  Thin.  Elderly.  HENT:  Right Ear: External ear normal.  Left Ear: External ear normal.  Nose: Nose normal.  Mouth/Throat: Oropharynx is clear and moist. No oropharyngeal exudate.  Eyes: Conjunctivae are normal. Pupils are equal, round, and reactive to light.  Neck: No JVD present. No tracheal deviation present. No thyromegaly present.  Cardiovascular: Normal rate, regular rhythm, normal heart sounds and intact distal pulses.  Exam reveals no gallop and no friction rub.   No murmur heard. Pulmonary/Chest: No  respiratory distress. He has no wheezes. He has no rales. He exhibits no tenderness.  Abdominal: He exhibits no distension and no mass. There is no tenderness.  Musculoskeletal: Normal range of motion. He exhibits no edema or tenderness.  uunstable gait  Lymphadenopathy:    He has no cervical adenopathy.  Neurological: He is alert and oriented to person, place, and time. He displays abnormal reflex (Absent at both knees). No cranial nerve deficit. Coordination normal.  Cogwheeling most evident in the right wrist. Gait is mildly abnormal with a suggestion of a festinating gait. Speech is soft and mildly pressured. There is no tremor. There is no focal weakness. 1/14//15 MMSE 30/30. Passed clock drawing. Test.  Skin: No rash noted. No erythema. No pallor.  Corn at the tip of the left middle toe. It actually goes under the nail a little bit.  Psychiatric:  Patient appears mildly anxious and perhaps depressed.      Labs reviewed: No visits with results within 3 Month(s) from this visit. Latest known visit with results is:  Nursing Home on 06/01/2014  Component Date Value Ref Range Status  . Hemoglobin 05/24/2014 12.1* 13.5 - 17.5 g/dL Final  . HCT 05/24/2014 35* 41 - 53 % Final  . Platelets 05/24/2014 156  150 - 399 K/L Final  . WBC 05/24/2014 5.2   Final  . Glucose 05/24/2014 90   Final  . BUN 05/24/2014 15  4 - 21 mg/dL Final  . Creatinine 05/24/2014 1.2  0.6 - 1.3 mg/dL Final  . Potassium 05/24/2014 4.2  3.4 - 5.3 mmol/L Final  . Sodium 05/24/2014 138  137 - 147 mmol/L Final  . LDl/HDL Ratio 05/24/2014 3.7   Final  . Triglycerides 05/24/2014 54  40 - 160 mg/dL Final  . Cholesterol 05/24/2014 166  0 - 200 mg/dL Final  . HDL 05/24/2014 45  35 - 70 mg/dL Final  . LDL Cholesterol 05/24/2014 110   Final  . Alkaline Phosphatase 05/24/2014 48  25 - 125 U/L Final  . ALT 05/24/2014 9* 10 - 40 U/L Final  . AST 05/24/2014 12* 14 - 40 U/L Final  . Bilirubin, Total 05/24/2014 0.4   Final    . TSH 05/24/2014 3.15  0.41 - 5.90 uIU/mL Final     Assessment/Plan  1. Parkinsonian features Has failed to tolerate 2 medications for Parkinsonism: Sinemet and amantadine. Other options are present, but he is reluctant to try anything else at this time. i suggested neurology appt, but he wants to wait.  2. Essential hypertension controlled  3. Peripheral neuropathy continues with numb feelings in the feet. Had PNCV in the past that showed both motor and sensory deficits of the neuropathy.

## 2014-12-08 ENCOUNTER — Other Ambulatory Visit: Payer: Self-pay | Admitting: *Deleted

## 2014-12-08 ENCOUNTER — Other Ambulatory Visit: Payer: Self-pay | Admitting: Internal Medicine

## 2014-12-08 DIAGNOSIS — G629 Polyneuropathy, unspecified: Secondary | ICD-10-CM

## 2014-12-08 MED ORDER — GABAPENTIN 300 MG PO CAPS: ORAL_CAPSULE | ORAL | Status: DC

## 2014-12-08 NOTE — Telephone Encounter (Signed)
Pharmacy Requested

## 2014-12-14 ENCOUNTER — Other Ambulatory Visit: Payer: Self-pay | Admitting: Internal Medicine

## 2014-12-14 DIAGNOSIS — G629 Polyneuropathy, unspecified: Secondary | ICD-10-CM

## 2014-12-14 MED ORDER — GABAPENTIN 300 MG PO CAPS: ORAL_CAPSULE | ORAL | Status: DC

## 2015-01-13 ENCOUNTER — Other Ambulatory Visit: Payer: Self-pay | Admitting: Internal Medicine

## 2015-03-07 DIAGNOSIS — D649 Anemia, unspecified: Secondary | ICD-10-CM | POA: Diagnosis not present

## 2015-03-07 LAB — HEPATIC FUNCTION PANEL
ALK PHOS: 50 U/L (ref 25–125)
ALT: 13 U/L (ref 10–40)
AST: 17 U/L (ref 14–40)
Bilirubin, Total: 0.5 mg/dL

## 2015-03-07 LAB — CBC AND DIFFERENTIAL
HEMATOCRIT: 36 % — AB (ref 41–53)
Hemoglobin: 12.3 g/dL — AB (ref 13.5–17.5)
Platelets: 160 10*3/uL (ref 150–399)
WBC: 4.9 10^3/mL

## 2015-03-07 LAB — BASIC METABOLIC PANEL
BUN: 17 mg/dL (ref 4–21)
CREATININE: 1.2 mg/dL (ref 0.6–1.3)
GLUCOSE: 95 mg/dL
Potassium: 4.1 mmol/L (ref 3.4–5.3)
SODIUM: 140 mmol/L (ref 137–147)

## 2015-03-08 ENCOUNTER — Encounter: Payer: Self-pay | Admitting: *Deleted

## 2015-03-15 ENCOUNTER — Non-Acute Institutional Stay: Payer: Medicare Other | Admitting: Internal Medicine

## 2015-03-15 ENCOUNTER — Encounter: Payer: Self-pay | Admitting: Internal Medicine

## 2015-03-15 VITALS — BP 136/74 | HR 72 | Temp 97.9°F | Wt 158.0 lb

## 2015-03-15 DIAGNOSIS — R259 Unspecified abnormal involuntary movements: Secondary | ICD-10-CM | POA: Diagnosis not present

## 2015-03-15 DIAGNOSIS — R609 Edema, unspecified: Secondary | ICD-10-CM | POA: Insufficient documentation

## 2015-03-15 DIAGNOSIS — I10 Essential (primary) hypertension: Secondary | ICD-10-CM

## 2015-03-15 DIAGNOSIS — D649 Anemia, unspecified: Secondary | ICD-10-CM | POA: Diagnosis not present

## 2015-03-15 DIAGNOSIS — K5901 Slow transit constipation: Secondary | ICD-10-CM

## 2015-03-15 MED ORDER — FUROSEMIDE 40 MG PO TABS: ORAL_TABLET | ORAL | Status: DC

## 2015-03-15 NOTE — Progress Notes (Signed)
Patient ID: Lawrence Turner, male   DOB: Dec 03, 1925, 81 y.o.   MRN: [id number]    [facility]     Place of Service: Clinic (12)     Allergies  Allergen Reactions  . Lyrica [Pregabalin]     *Anticonvulsants*, fatique    Chief Complaint  Patient presents with  . Medical Management of Chronic Issues    Pakinsonian, blood pressure, Peripheral Neuropathy  . Foot Swelling    bilateral, for 2-3 weeks    HPI:  Edema: Increasing edema for several weeks. He correlates it with the time that he began increasing the dose of gabapentin. Increasing the gabapentin did seem to help his neuropathy.  Essential hypertension: Controlled  Parkinsonian features: Unchanged. Stiff and rigid. Unsteady gait. Not much tremor. Flattened and is generally unexpressive facial features.  Slow transit constipation: Continues to use syringes and order relieve this problem  Anemia, unspecified anemia type: Recent lab work showed hemoglobin 12.3.    Medications: Patient's Medications  New Prescriptions   No medications on file  Previous Medications   ACETAMINOPHEN (TYLENOL) 325 MG TABLET    Take 650 mg by mouth. Take one tablet as needed   FENOFIBRATE (TRICOR) 48 MG TABLET    TAKE 1 TABLET BY MOUTH DAILY TO LOWER TRIGLYCERIDES   GABAPENTIN (NEURONTIN) 300 MG CAPSULE    Two each night to help pain from neuropathy   MULTIPLE VITAMIN (MULTIVITAMIN) TABLET    Take 1 tablet by mouth daily.   PANTOPRAZOLE (PROTONIX) 40 MG TABLET    TAKE 1 TABLET BY MOUTH DAILY TO REDUCE STOMACH ACID   VITAMIN B-12 (CYANOCOBALAMIN) 1000 MCG TABLET    Take 1,000 mcg by mouth daily.  Modified Medications   No medications on file  Discontinued Medications   AMANTADINE (SYMMETREL) 100 MG CAPSULE    One twice daily to help reduce tremor   DOCUSATE SODIUM (COLACE) 100 MG CAPSULE    Take 100 mg by mouth. Take one dail for stool softener     Review of Systems  Constitutional: Positive for fatigue. Negative for fever,  chills, diaphoresis, activity change, appetite change and unexpected weight change.  HENT: Negative for congestion, dental problem, ear pain, hearing loss, mouth sores and rhinorrhea.   Eyes: Negative for photophobia, pain, discharge, redness and visual disturbance.  Respiratory: Negative for apnea, cough, choking, chest tightness, shortness of breath and wheezing.   Cardiovascular: Positive for leg swelling. Negative for chest pain and palpitations.  Gastrointestinal: Positive for constipation. Negative for nausea, abdominal pain, diarrhea, blood in stool and abdominal distention.  Endocrine: Negative.   Genitourinary: Negative.   Musculoskeletal: Positive for gait problem (using cane). Negative for myalgias, back pain, joint swelling, arthralgias, neck pain and neck stiffness.  Skin:       Multiple seborrheic keratoses  Allergic/Immunologic: Negative.   Neurological: Positive for weakness and numbness. Negative for tremors, seizures, syncope and headaches.       Complaint of loss of motor skills and changes in. He is unstable with walking. He has a known peripheral neuropathy of uncertain origin. Micrographia.  Hematological:       History of anemia  Psychiatric/Behavioral: Positive for dysphoric mood. The patient is nervous/anxious.     Filed Vitals:   03/15/15 0855  BP: 136/74  Pulse: 72  Temp: 97.9 F (36.6 C)  TempSrc: Oral  Weight: 158 lb (71.668 kg)  SpO2: 99%   Body mass index is 24.37 kg/(m^2).  Physical Exam  Constitutional: He is oriented to person,  place, and time.  Thin. Elderly.  HENT:  Right Ear: External ear normal.  Left Ear: External ear normal.  Nose: Nose normal.  Mouth/Throat: Oropharynx is clear and moist. No oropharyngeal exudate.  Eyes: Conjunctivae are normal. Pupils are equal, round, and reactive to light.  Neck: No JVD present. No tracheal deviation present. No thyromegaly present.  Cardiovascular: Normal rate, regular rhythm, normal heart sounds  and intact distal pulses.  Exam reveals no gallop and no friction rub.   No murmur heard. Pulmonary/Chest: No respiratory distress. He has no wheezes. He has no rales. He exhibits no tenderness.  Abdominal: He exhibits no distension and no mass. There is no tenderness.  Musculoskeletal: Normal range of motion. He exhibits edema (1-2+ bipedal). He exhibits no tenderness.  uunstable gait  Lymphadenopathy:    He has no cervical adenopathy.  Neurological: He is alert and oriented to person, place, and time. He displays abnormal reflex (Absent at both knees). No cranial nerve deficit. Coordination normal.  Cogwheeling most evident in the right wrist. Gait is mildly abnormal with a suggestion of a festinating gait. Speech is soft and mildly pressured. There is no tremor. There is no focal weakness. 1/14//15 MMSE 30/30. Passed clock drawing. Test.  Skin: No rash noted. No erythema. No pallor.  Corn at the tip of the left middle toe. It actually goes under the nail a little bit.  Psychiatric:  Patient appears mildly anxious and perhaps depressed.      Labs reviewed: Abstract on 03/08/2015  Component Date Value Ref Range Status  . Hemoglobin 03/07/2015 12.3* 13.5 - 17.5 g/dL Final  . HCT 03/07/2015 36* 41 - 53 % Final  . Platelets 03/07/2015 160  150 - 399 K/L Final  . WBC 03/07/2015 4.9   Final  . Glucose 03/07/2015 95   Final  . BUN 03/07/2015 17  4 - 21 mg/dL Final  . Creatinine 03/07/2015 1.2  0.6 - 1.3 mg/dL Final  . Potassium 03/07/2015 4.1  3.4 - 5.3 mmol/L Final  . Sodium 03/07/2015 140  137 - 147 mmol/L Final  . Alkaline Phosphatase 03/07/2015 50  25 - 125 U/L Final  . ALT 03/07/2015 13  10 - 40 U/L Final  . AST 03/07/2015 17  14 - 40 U/L Final  . Bilirubin, Total 03/07/2015 0.5   Final     Assessment/Plan  1. Edema Discussed compression stockings, but he is not interested in using these at this time. He feels that he could not get them on very well. - furosemide (LASIX) 40  MG tablet; One daily to help control edema  Dispense: 30 tablet; Refill: 3  2. Essential hypertension Controlled  3. Parkinsonian features Unchanged  4. Slow transit constipation Continue syringes glycerin suppositories when necessary  5. Anemia, unspecified anemia type Although there is mild anemia, it is a normocytic, normochromic anemia and does not seem to be impairing him in any way. Continue to monitor with future lab.

## 2015-03-21 ENCOUNTER — Encounter: Payer: Self-pay | Admitting: Internal Medicine

## 2015-04-12 ENCOUNTER — Non-Acute Institutional Stay: Payer: Medicare Other | Admitting: Internal Medicine

## 2015-04-12 ENCOUNTER — Encounter: Payer: Self-pay | Admitting: Internal Medicine

## 2015-04-12 VITALS — BP 146/80 | HR 80 | Temp 97.7°F | Wt 153.0 lb

## 2015-04-12 DIAGNOSIS — R259 Unspecified abnormal involuntary movements: Secondary | ICD-10-CM | POA: Diagnosis not present

## 2015-04-12 DIAGNOSIS — L84 Corns and callosities: Secondary | ICD-10-CM

## 2015-04-12 DIAGNOSIS — R609 Edema, unspecified: Secondary | ICD-10-CM | POA: Diagnosis not present

## 2015-04-12 DIAGNOSIS — I10 Essential (primary) hypertension: Secondary | ICD-10-CM

## 2015-04-12 NOTE — Progress Notes (Signed)
Patient ID: Lawrence Turner, male   DOB: [birth year], 81 y.o.   MRN: [id number]    [facility]     Place of Service: Clinic (12)     Allergies  Allergen Reactions  . Lyrica [Pregabalin]     *Anticonvulsants*, fatique    Chief Complaint  Patient presents with  . Medical Management of Chronic Issues    4 week follow up on edema    HPI:  Edema: Trace amount of both feet. Patient did not fill prescription for diuretics. He cut back on gabapentin and is now on only 300 mg daily. He thinks the swelling is somewhat better. He prefers not to use  diuretics.  Corn: Sore tip of the left third toe due to corn with hemorrhage underneath it.  Essential hypertension: Controlled  Parkinsonian features: unchanged    Medications: Patient's Medications  New Prescriptions   No medications on file  Previous Medications   ACETAMINOPHEN (TYLENOL) 325 MG TABLET    Take 650 mg by mouth. Take one tablet as needed   FENOFIBRATE (TRICOR) 48 MG TABLET    TAKE 1 TABLET BY MOUTH DAILY TO LOWER TRIGLYCERIDES   FUROSEMIDE (LASIX) 40 MG TABLET    One daily to help control edema   GABAPENTIN (NEURONTIN) 300 MG CAPSULE    Two each night to help pain from neuropathy   MULTIPLE VITAMIN (MULTIVITAMIN) TABLET    Take 1 tablet by mouth daily.   PANTOPRAZOLE (PROTONIX) 40 MG TABLET    TAKE 1 TABLET BY MOUTH DAILY TO REDUCE STOMACH ACID   VITAMIN B-12 (CYANOCOBALAMIN) 1000 MCG TABLET    Take 1,000 mcg by mouth daily.  Modified Medications   No medications on file  Discontinued Medications   No medications on file     Review of Systems  Constitutional: Positive for fatigue. Negative for fever, chills, diaphoresis, activity change, appetite change and unexpected weight change.  HENT: Negative for congestion, dental problem, ear pain, hearing loss, mouth sores and rhinorrhea.   Eyes: Negative for photophobia, pain, discharge, redness and visual disturbance.  Respiratory: Negative for apnea, cough,  choking, chest tightness, shortness of breath and wheezing.   Cardiovascular: Positive for leg swelling. Negative for chest pain and palpitations.  Gastrointestinal: Positive for constipation. Negative for nausea, abdominal pain, diarrhea, blood in stool and abdominal distention.  Endocrine: Negative.   Genitourinary: Negative.   Musculoskeletal: Positive for gait problem (using cane). Negative for myalgias, back pain, joint swelling, arthralgias, neck pain and neck stiffness.  Skin:       Multiple seborrheic keratoses. Corn on the tip pf the left 3rd toe.  Allergic/Immunologic: Negative.   Neurological: Positive for weakness and numbness. Negative for tremors, seizures, syncope and headaches.       Complaint of loss of motor skills and changes in. He is unstable with walking. He has a known peripheral neuropathy of uncertain origin. Micrographia.  Hematological:       History of anemia  Psychiatric/Behavioral: Positive for dysphoric mood. The patient is nervous/anxious.     Filed Vitals:   04/12/15 0909  BP: 146/80  Pulse: 80  Temp: 97.7 F (36.5 C)  TempSrc: Oral  Weight: 153 lb (69.4 kg)  SpO2: 94%   Body mass index is 23.6 kg/(m^2).  Physical Exam  Constitutional: He is oriented to person, place, and time.  Thin. Elderly.  HENT:  Right Ear: External ear normal.  Left Ear: External ear normal.  Nose: Nose normal.  Mouth/Throat: Oropharynx is clear and  moist. No oropharyngeal exudate.  Eyes: Conjunctivae are normal. Pupils are equal, round, and reactive to light.  Neck: No JVD present. No tracheal deviation present. No thyromegaly present.  Cardiovascular: Normal rate, regular rhythm, normal heart sounds and intact distal pulses.  Exam reveals no gallop and no friction rub.   No murmur heard. Pulmonary/Chest: No respiratory distress. He has no wheezes. He has no rales. He exhibits no tenderness.  Abdominal: He exhibits no distension and no mass. There is no tenderness.    Musculoskeletal: Normal range of motion. He exhibits edema (1+ ipedal). He exhibits no tenderness.  uunstable gait  Lymphadenopathy:    He has no cervical adenopathy.  Neurological: He is alert and oriented to person, place, and time. He displays abnormal reflex (Absent at both knees). No cranial nerve deficit. Coordination normal.  Cogwheeling most evident in the right wrist. Gait is mildly abnormal with a suggestion of a festinating gait. Speech is soft and mildly pressured. There is no tremor. There is no focal weakness. 1/14//15 MMSE 30/30. Passed clock drawing test.  Skin: No rash noted. No erythema. No pallor.  Corn at the tip of the left middle toe. It actually goes under the nail a little bit. There is hemorrhage under the corn.  Psychiatric:  Patient appears mildly anxious and perhaps depressed.      Labs reviewed: Abstract on 03/08/2015  Component Date Value Ref Range Status  . Hemoglobin 03/07/2015 12.3* 13.5 - 17.5 g/dL Final  . HCT 03/07/2015 36* 41 - 53 % Final  . Platelets 03/07/2015 160  150 - 399 K/L Final  . WBC 03/07/2015 4.9   Final  . Glucose 03/07/2015 95   Final  . BUN 03/07/2015 17  4 - 21 mg/dL Final  . Creatinine 03/07/2015 1.2  0.6 - 1.3 mg/dL Final  . Potassium 03/07/2015 4.1  3.4 - 5.3 mmol/L Final  . Sodium 03/07/2015 140  137 - 147 mmol/L Final  . Alkaline Phosphatase 03/07/2015 50  25 - 125 U/L Final  . ALT 03/07/2015 13  10 - 40 U/L Final  . AST 03/07/2015 17  14 - 40 U/L Final  . Bilirubin, Total 03/07/2015 0.5   Final     Assessment/Plan  1. Edema Advised patient to start try to continue to cut back on gabapentin as opposed to using diuretics.  2. Corn Close with alcohol wipe and debrided sharply with #15 scalpel. No bleeding. Patient tolerated procedure well and he said his pain was better.  3. Essential hypertension Controlled  4. Parkinsonian features Unchanged

## 2015-05-19 DIAGNOSIS — Z23 Encounter for immunization: Secondary | ICD-10-CM | POA: Diagnosis not present

## 2015-06-07 ENCOUNTER — Encounter: Payer: Self-pay | Admitting: Internal Medicine

## 2015-06-07 ENCOUNTER — Non-Acute Institutional Stay: Payer: Medicare Other | Admitting: Internal Medicine

## 2015-06-07 VITALS — BP 160/80 | HR 73 | Temp 97.7°F | Resp 20 | Wt 156.6 lb

## 2015-06-07 DIAGNOSIS — L84 Corns and callosities: Secondary | ICD-10-CM | POA: Diagnosis not present

## 2015-06-07 NOTE — Progress Notes (Signed)
Patient ID: Lawrence Turner, male   DOB: 22-Jun-1926, 81 y.o.   MRN: [id number]    [facility]     Place of Service: Clinic (12)     Allergies  Allergen Reactions  . Lyrica [Pregabalin]     *Anticonvulsants*, fatique    Chief Complaint  Patient presents with  . Acute Visit    stump third toe on left foot, hurts to walk    HPI:  Painful corn left third toe with the tip of the toe. This is a hammertoe. He stubbed the toe several weeks ago. There is hemorrhage at the proximal end of the corn burrowing underneath the nail.  Medications: Patient's Medications  New Prescriptions   No medications on file  Previous Medications   ACETAMINOPHEN (TYLENOL) 325 MG TABLET    Take 650 mg by mouth. Take one tablet as needed   FENOFIBRATE (TRICOR) 48 MG TABLET    TAKE 1 TABLET BY MOUTH DAILY TO LOWER TRIGLYCERIDES   FUROSEMIDE (LASIX) 40 MG TABLET    One daily to help control edema   GABAPENTIN (NEURONTIN) 300 MG CAPSULE    Two each night to help pain from neuropathy   MULTIPLE VITAMIN (MULTIVITAMIN) TABLET    Take 1 tablet by mouth daily.   PANTOPRAZOLE (PROTONIX) 40 MG TABLET    TAKE 1 TABLET BY MOUTH DAILY TO REDUCE STOMACH ACID   VITAMIN B-12 (CYANOCOBALAMIN) 1000 MCG TABLET    Take 1,000 mcg by mouth daily.  Modified Medications   No medications on file  Discontinued Medications   No medications on file     Review of Systems  Constitutional: Positive for fatigue. Negative for fever, chills, diaphoresis, activity change, appetite change and unexpected weight change.  HENT: Negative for congestion, dental problem, ear pain, hearing loss, mouth sores and rhinorrhea.   Eyes: Negative for photophobia, pain, discharge, redness and visual disturbance.  Respiratory: Negative for apnea, cough, choking, chest tightness, shortness of breath and wheezing.   Cardiovascular: Positive for leg swelling. Negative for chest pain and palpitations.  Gastrointestinal: Positive for  constipation. Negative for nausea, abdominal pain, diarrhea, blood in stool and abdominal distention.  Endocrine: Negative.   Genitourinary: Negative.   Musculoskeletal: Positive for gait problem (using cane). Negative for myalgias, back pain, joint swelling, arthralgias, neck pain and neck stiffness.  Skin:       Multiple seborrheic keratoses. Corn on the tip pf the left 3rd toe that has a hemorrhage close to the nail.Marland Kitchen Has become much more painful since he stopped his toe while "working out".  Allergic/Immunologic: Negative.   Neurological: Positive for weakness and numbness. Negative for tremors, seizures, syncope and headaches.       Complaint of loss of motor skills and changes in. He is unstable with walking. He has a known peripheral neuropathy of uncertain origin. Micrographia.  Hematological:       History of anemia  Psychiatric/Behavioral: Positive for dysphoric mood. The patient is nervous/anxious.     Filed Vitals:   06/07/15 1113  BP: 160/80  Pulse: 73  Temp: 97.7 F (36.5 C)  TempSrc: Oral  Resp: 20  Weight: 156 lb 9.6 oz (71.033 kg)  SpO2: 97%   Body mass index is 24.15 kg/(m^2).  Physical Exam  Constitutional: He is oriented to person, place, and time.  Thin. Elderly.  HENT:  Right Ear: External ear normal.  Left Ear: External ear normal.  Nose: Nose normal.  Mouth/Throat: Oropharynx is clear and moist. No oropharyngeal exudate.  Eyes: Conjunctivae are normal. Pupils are equal, round, and reactive to light.  Neck: No JVD present. No tracheal deviation present. No thyromegaly present.  Cardiovascular: Normal rate, regular rhythm, normal heart sounds and intact distal pulses.  Exam reveals no gallop and no friction rub.   No murmur heard. Pulmonary/Chest: No respiratory distress. He has no wheezes. He has no rales. He exhibits no tenderness.  Abdominal: He exhibits no distension and no mass. There is no tenderness.  Musculoskeletal: Normal range of motion. He  exhibits edema (1+ ipedal). He exhibits no tenderness.  uunstable gait  Lymphadenopathy:    He has no cervical adenopathy.  Neurological: He is alert and oriented to person, place, and time. He displays abnormal reflex (Absent at both knees). No cranial nerve deficit. Coordination normal.  Cogwheeling most evident in the right wrist. Gait is mildly abnormal with a suggestion of a festinating gait. Speech is soft and mildly pressured. There is no tremor. There is no focal weakness. 1/14//15 MMSE 30/30. Passed clock drawing test.  Skin: No rash noted. No erythema. No pallor.  Corn at the tip of the left middle toe. It actually goes under the nail a little bit. There is hemorrhage under the corn.  Psychiatric:  Patient appears mildly anxious and perhaps depressed.      Labs reviewed: Lab Summary Latest Ref Rng 03/07/2015 05/24/2014  Hemoglobin 13.5 - 17.5 g/dL 12.3(A) 12.1(A)  Hematocrit 41 - 53 % 36(A) 35(A)  White count - 4.9 5.2  Platelet count 150 - 399 K/L 160 156  Sodium 137 - 147 mmol/L 140 138  Potassium 3.4 - 5.3 mmol/L 4.1 4.2  Calcium - (None) (None)  Phosphorus - (None) (None)  Creatinine 0.6 - 1.3 mg/dL 1.2 1.2  AST 14 - 40 U/L 17 12(A)  Alk Phos 25 - 125 U/L 50 48  Bilirubin - (None) (None)  Glucose - 95 90  Cholesterol 0 - 200 mg/dL (None) 166  HDL cholesterol 35 - 70 mg/dL (None) 45  Triglycerides 40 - 160 mg/dL (None) 54  LDL Direct - (None) (None)  LDL Calc - (None) 110  Total protein - (None) (None)  Albumin - (None) (None)   Lab Results  Component Value Date   TSH 3.15 05/24/2014   Lab Results  Component Value Date   BUN 17 03/07/2015   No results found for: HGBA1C     Assessment/Plan  1. Corn Total cleansed with alcohol pad. Using a #15 blade the corn was sharply debrided. Hemorrhagic area was removed. There was a tiny bit of bleeding following this procedure. No pain was encountered. Patient felt much more comfortable walking following the  procedure.

## 2015-06-28 ENCOUNTER — Encounter: Payer: Self-pay | Admitting: Nurse Practitioner

## 2015-06-28 NOTE — Progress Notes (Signed)
Patient ID: Lawrence Turner, male   DOB: Sep 10, 1925, 81 y.o.   MRN: [id number]  Location:  SNF [facility] Provider:  Marlana Latus NP  Code Status:  DNR Goals of care: Advanced Directive information    No chief complaint on file.    HPI: Patient is a 81 y.o. male seen in the SNF at [facility] today for evaluation of   and other chronic medical conditions.   Review of Systems:  ROS  Past Medical History  Diagnosis Date  . Mononeuritis of unspecified site   . Unspecified constipation   . Unspecified hemorrhoids without mention of complication   . Benign neoplasm of colon     pre-cancerous polyps removed at colonoscopy 2008  . Pure hypercholesterolemia   . Unspecified vitamin D deficiency   . Unspecified gastritis and gastroduodenitis without mention of hemorrhage 08/29/2006  . Other pulmonary embolism and infarction     following cholecystectomy 2003  . Anemia, unspecified   . Hypertrophy of prostate without urinary obstruction and other lower urinary tract symptoms (LUTS)   . Other B-complex deficiencies   . 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 Rosacea   . Osteoarthrosis, unspecified whether generalized or localized, pelvic region and thigh   . Diverticulosis of colon (without mention of hemorrhage)   . Contact dermatitis and other eczema, due to unspecified cause   . Esophageal reflux   . Unspecified essential hypertension   . Peripheral neuropathy [facility])     Patient Active Problem List   Diagnosis Date Noted  . Edema 03/15/2015  . Constipation 10/05/2014  . Corn 10/05/2014  . Dysphagia 07/29/2014  . Parkinsonian features 02/02/2014  . Essential hypertension   . Esophageal reflux   . Peripheral neuropathy ([place])   . Hypertrophy of prostate without urinary obstruction and other lower urinary tract symptoms (LUTS)   . Other B-complex deficiencies   . Anemia   . Unspecified vitamin D deficiency   . Pure hypercholesterolemia   . Benign neoplasm  of colon   . Unspecified constipation     Allergies  Allergen Reactions  . Lyrica [Pregabalin]     *Anticonvulsants*, fatique    Medications: Patient's Medications  New Prescriptions   No medications on file  Previous Medications   ACETAMINOPHEN (TYLENOL) 325 MG TABLET    Take 650 mg by mouth. Take one tablet as needed   FENOFIBRATE (TRICOR) 48 MG TABLET    TAKE 1 TABLET BY MOUTH DAILY TO LOWER TRIGLYCERIDES   FUROSEMIDE (LASIX) 40 MG TABLET    One daily to help control edema   GABAPENTIN (NEURONTIN) 300 MG CAPSULE    Two each night to help pain from neuropathy   MULTIPLE VITAMIN (MULTIVITAMIN) TABLET    Take 1 tablet by mouth daily.   PANTOPRAZOLE (PROTONIX) 40 MG TABLET    TAKE 1 TABLET BY MOUTH DAILY TO REDUCE STOMACH ACID   VITAMIN B-12 (CYANOCOBALAMIN) 1000 MCG TABLET    Take 1,000 mcg by mouth daily.  Modified Medications   No medications on file  Discontinued Medications   No medications on file    Physical Exam: Filed Vitals:   06/28/15 1049  BP: 133/83  Pulse: 80  Temp: 98 F (36.7 C)  TempSrc: Tympanic  Resp: 16   There is no weight on file to calculate BMI.  Physical Exam  Labs reviewed: Basic Metabolic Panel:  Recent Labs  03/07/15  NA 140  K 4.1  BUN 17  CREATININE 1.2  Liver Function Tests:  Recent Labs  03/07/15  AST 17  ALT 13  ALKPHOS 50    CBC:  Recent Labs  03/07/15  WBC 4.9  HGB 12.3*  HCT 36*  PLT 160    Lab Results  Component Value Date   TSH 3.15 05/24/2014   No results found for: HGBA1C Lab Results  Component Value Date   CHOL 166 05/24/2014   HDL 45 05/24/2014   LDLCALC 110 05/24/2014   TRIG 54 05/24/2014    Significant Diagnostic Results since last visit:   Patient Care Team: Estill Dooms, MD as PCP - General (Internal Medicine) Lawrence Balestrieri X, NP as Nurse Practitioner (Nurse Practitioner)  Assessment/Plan Problem List Items Addressed This Visit    None       Family/ staff Communication:    Labs/tests ordered:    Marlana Latus NP [facility] [address], [place] [address] On Call:  [phone] & follow prompts after 5pm & weekends Office Phone:  [phone] Office Fax:  [phone]   This encounter was created in error - please disregard.

## 2015-08-06 ENCOUNTER — Other Ambulatory Visit: Payer: Self-pay | Admitting: Internal Medicine

## 2015-08-09 ENCOUNTER — Non-Acute Institutional Stay: Payer: Medicare Other | Admitting: Internal Medicine

## 2015-08-09 ENCOUNTER — Telehealth: Payer: Self-pay | Admitting: *Deleted

## 2015-08-09 ENCOUNTER — Ambulatory Visit
Admission: RE | Admit: 2015-08-09 | Discharge: 2015-08-09 | Disposition: A | Discharge status: Home / Self Care | Payer: Medicare Other | Source: Ambulatory Visit | Attending: Internal Medicine | Admitting: Internal Medicine

## 2015-08-09 ENCOUNTER — Encounter: Payer: Self-pay | Admitting: Internal Medicine

## 2015-08-09 VITALS — BP 144/78 | HR 89 | Temp 98.2°F | Ht 68.0 in

## 2015-08-09 DIAGNOSIS — M25552 Pain in left hip: Secondary | ICD-10-CM

## 2015-08-09 DIAGNOSIS — K5901 Slow transit constipation: Secondary | ICD-10-CM

## 2015-08-09 DIAGNOSIS — S79912A Unspecified injury of left hip, initial encounter: Secondary | ICD-10-CM | POA: Diagnosis not present

## 2015-08-09 NOTE — Progress Notes (Signed)
Patient ID: Lawrence Turner, male   DOB: Feb 01, 1926, 81 y.o.   MRN: [id number]    [facility]     Place of Service: Clinic (12)     Allergies  Allergen Reactions  . Lyrica [Pregabalin]     *Anticonvulsants*, fatique    Chief Complaint  Patient presents with  . Medical Management of Chronic Issues    blood prssure, Parkinson, edema.  . Fall    08/03/15 left hip hurts to walk    HPI:  [place] on 08/03/15. Has had pain in the left hip since then. Unable to walk without walker and only short distances then. Using tylenol for pain relief. No bruising.   Second issue of increasing problem with constipation. Has bee going on for months. No blood in the stool. Straining and having to spend hours on the the toilet. Must use a enema to get relief. Had a colonosopy years ago that showed polyps. Had hemorrhoidectomy as a young man. He is concerned about a "blockage" or an anal problem. He wants a exam to see if there is a problem.  Medications: Patient's Medications  New Prescriptions   No medications on file  Previous Medications   ACETAMINOPHEN (TYLENOL) 325 MG TABLET    Take 650 mg by mouth. Take one tablet as needed   FENOFIBRATE (TRICOR) 48 MG TABLET    TAKE 1 TABLET BY MOUTH DAILY TO LOWER TRIGLYCERIDES   FUROSEMIDE (LASIX) 40 MG TABLET    One daily to help control edema   GABAPENTIN (NEURONTIN) 300 MG CAPSULE    Two each night to help pain from neuropathy   GABAPENTIN (NEURONTIN) 300 MG CAPSULE    Take 300 mg by mouth. Take one tablet in morning to help pain from neuropathy   MULTIPLE VITAMIN (MULTIVITAMIN) TABLET    Take 1 tablet by mouth daily.   PANTOPRAZOLE (PROTONIX) 40 MG TABLET    TAKE 1 TABLET BY MOUTH DAILY TO REDUCE STOMACH ACID   VITAMIN B-12 (CYANOCOBALAMIN) 1000 MCG TABLET    Take 1,000 mcg by mouth daily.  Modified Medications   No medications on file  Discontinued Medications   GABAPENTIN (NEURONTIN) 300 MG CAPSULE    TAKE 2 CAPSULES EVERY NIGHT AT BEDTIME  FOR PAIN FROM NEUROPATHY     Review of Systems  Constitutional: Positive for fatigue. Negative for fever, chills, diaphoresis, activity change, appetite change and unexpected weight change.  HENT: Negative for congestion, dental problem, ear pain, hearing loss, mouth sores and rhinorrhea.   Eyes: Negative for photophobia, pain, discharge, redness and visual disturbance.  Respiratory: Negative for apnea, cough, choking, chest tightness, shortness of breath and wheezing.   Cardiovascular: Positive for leg swelling. Negative for chest pain and palpitations.  Gastrointestinal: Positive for constipation. Negative for nausea, abdominal pain, diarrhea, blood in stool and abdominal distention.  Endocrine: Negative.   Genitourinary: Negative.   Musculoskeletal: Positive for gait problem (using cane). Negative for myalgias, back pain, joint swelling, arthralgias, neck pain and neck stiffness.  Skin:       Multiple seborrheic keratoses. Corn on the tip pf the left 3rd toe that has a hemorrhage close to the nail.Marland Kitchen Has become much more painful since he stopped his toe while "working out".  Allergic/Immunologic: Negative.   Neurological: Positive for weakness and numbness. Negative for tremors, seizures, syncope and headaches.       Complaint of loss of motor skills and changes in. He is unstable with walking. He has a known peripheral neuropathy of  uncertain origin. Micrographia.  Hematological:       History of anemia  Psychiatric/Behavioral: Positive for dysphoric mood. The patient is nervous/anxious.     Filed Vitals:   08/09/15 0904  BP: 144/78  Pulse: 89  Temp: 98.2 F (36.8 C)  TempSrc: Oral  Height: 5' 8"  (1.727 m)  SpO2: 99%   There is no weight on file to calculate BMI.  Physical Exam  Constitutional: He is oriented to person, place, and time.  Thin. Elderly.  HENT:  Right Ear: External ear normal.  Left Ear: External ear normal.  Nose: Nose normal.  Mouth/Throat: Oropharynx  is clear and moist. No oropharyngeal exudate.  Eyes: Conjunctivae are normal. Pupils are equal, round, and reactive to light.  Neck: No JVD present. No tracheal deviation present. No thyromegaly present.  Cardiovascular: Normal rate, regular rhythm, normal heart sounds and intact distal pulses.  Exam reveals no gallop and no friction rub.   No murmur heard. Pulmonary/Chest: No respiratory distress. He has no wheezes. He has no rales. He exhibits no tenderness.  Abdominal: He exhibits no distension and no mass. There is no tenderness.  Musculoskeletal: Normal range of motion. He exhibits edema (1+ ipedal). He exhibits no tenderness.  uunstable gait  Lymphadenopathy:    He has no cervical adenopathy.  Neurological: He is alert and oriented to person, place, and time. He displays abnormal reflex (Absent at both knees). No cranial nerve deficit. Coordination normal.  Cogwheeling most evident in the right wrist. Gait is mildly abnormal with a suggestion of a festinating gait. Speech is soft and mildly pressured. There is no tremor. There is no focal weakness. 1/14//15 MMSE 30/30. Passed clock drawing test.  Skin: No rash noted. No erythema. No pallor.  Corn at the tip of the left middle toe. It actually goes under the nail a little bit. There is hemorrhage under the corn.  Psychiatric:  Patient appears mildly anxious and perhaps depressed.      Labs reviewed: Lab Summary Latest Ref Rng 03/07/2015 05/24/2014  Hemoglobin 13.5 - 17.5 g/dL 12.3(A) 12.1(A)  Hematocrit 41 - 53 % 36(A) 35(A)  White count - 4.9 5.2  Platelet count 150 - 399 K/L 160 156  Sodium 137 - 147 mmol/L 140 138  Potassium 3.4 - 5.3 mmol/L 4.1 4.2  Calcium - (None) (None)  Phosphorus - (None) (None)  Creatinine 0.6 - 1.3 mg/dL 1.2 1.2  AST 14 - 40 U/L 17 12(A)  Alk Phos 25 - 125 U/L 50 48  Bilirubin - (None) (None)  Glucose - 95 90  Cholesterol 0 - 200 mg/dL (None) 166  HDL cholesterol 35 - 70 mg/dL (None) 45    Triglycerides 40 - 160 mg/dL (None) 54  LDL Direct - (None) (None)  LDL Calc - (None) 110  Total protein - (None) (None)  Albumin - (None) (None)   Lab Results  Component Value Date   TSH 3.15 05/24/2014   Lab Results  Component Value Date   BUN 17 03/07/2015    Assessment/Plan  Pain in left hip -xray left hip  Slow transit constipation -Barium enema -Daily enema as needed

## 2015-08-09 NOTE — Telephone Encounter (Signed)
Spoke with Lawrence Turner earlier and she stated that if patient's X-Ray came back negative for fracture to do a Barium Enema Olin Hauser with [facility] called and stated that an order needed to be placed for one so they could schedule patient.  Order placed.

## 2015-08-10 ENCOUNTER — Telehealth: Payer: Self-pay

## 2015-08-10 NOTE — Telephone Encounter (Addendum)
Threse from [facility] was calling on patient's behalf to get a status update on xray that was done yesterday and to clarify what the next step will be. I referred to phone note dated 08/09/15, I shared contents with Threse and informed her I will call patient with results and inform him of next step.  I left message on voicemail for patient with details: Xray was negative for fracture and we will call him once Barium Enema is scheduled with [facility]. Patient was instructed to call back if he has questions or concerns

## 2015-08-11 ENCOUNTER — Non-Acute Institutional Stay: Payer: Medicare Other | Admitting: Internal Medicine

## 2015-08-11 ENCOUNTER — Encounter: Payer: Self-pay | Admitting: Internal Medicine

## 2015-08-11 DIAGNOSIS — R259 Unspecified abnormal involuntary movements: Secondary | ICD-10-CM | POA: Diagnosis not present

## 2015-08-11 DIAGNOSIS — M25552 Pain in left hip: Secondary | ICD-10-CM

## 2015-08-11 DIAGNOSIS — K5901 Slow transit constipation: Secondary | ICD-10-CM

## 2015-08-11 MED ORDER — LACTULOSE 20 GM/30ML PO SOLN: ORAL | Status: DC

## 2015-08-11 NOTE — Progress Notes (Signed)
Patient ID: [facility], male   DOB: [birth year], 81 y.o.   MRN: [id number]    HISTORY AND PHYSICAL  Location:  [facility] Room Number: AL 33 Place of Service: ALF [phone])   Extended Emergency Contact Information Primary Emergency Contact: Lucita Ferrara Address: [address]           [place], [place] [address] [place] Phone: [phone] Relation: Spouse  Advanced Directive information Does patient have an advance directive?: Yes, Type of Advance Directive: Healthcare Power of [place];Living will, Does patient want to make changes to advanced directive?: No - Patient declined  Chief Complaint  Patient presents with  . Medical Management of Chronic Issues    New Admission to assisted living from independent living     HPI:  Patient was seen recently for increasing pain of the left hip area. He had fallen in his apartment. He was sent out for x-rays of the left no fracture was noted, but there was significant degenerative changes in the left hip. Pains have begun to improve. He is now able to use his walker although is unsteady when he is walking.  Patient has a second issue of constipation. He has become dependent upon enemas in order to have a bowel movement. His last colonoscopy was in the remote past. It did show some small polyps. There is been no rectal bleeding or discomfort. He believes he needs a study to rule out an obstructive problem in the colon. He has been scheduled for a barium enema. Orders have been written for lactulose 2 ounces twice daily.  Patient has parkinsonism. There is mild tremor at rest. He has a gait instability with a festinating gait. He had several falls.  Patient's wife recently fell and had fractures of the pubic rami as well as an intratrochanteric fracture of the right femur. She was moved from the skilled nursing facility area to the assisted living area yesterday. Lawrence [facility] is not presently capable of  taking care of himself independently. He has moved to the assisted living area for supportive care while his hip pain resolved. Physical therapy has been requested to teach him safe mobility as well as strengthening. He also has a barium enema scheduled 08/19/2015. He would not be capable of undergoing the prep for this without assistance. He will remain in the assisted living area at least until barium enema is completed.  Past Medical History  Diagnosis Date  . Mononeuritis of unspecified site   . Unspecified constipation   . Unspecified hemorrhoids without mention of complication   . Benign neoplasm of colon     pre-cancerous polyps removed at colonoscopy 2008  . Pure hypercholesterolemia   . Unspecified vitamin D deficiency   . Unspecified gastritis and gastroduodenitis without mention of hemorrhage 08/29/2006  . Other pulmonary embolism and infarction     following cholecystectomy 2003  . Anemia, unspecified   . Hypertrophy of prostate without urinary obstruction and other lower urinary tract symptoms (LUTS)   . Other B-complex deficiencies   . 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 Rosacea   . Osteoarthrosis, unspecified whether generalized or localized, pelvic region and thigh   . Diverticulosis of colon (without mention of hemorrhage)   . Contact dermatitis and other eczema, due to unspecified cause   . Esophageal reflux   . Unspecified essential hypertension   . Peripheral neuropathy [facility])     Past Surgical History  Procedure  Laterality Date  . Hemorrhoid surgery  1953  . Cholecystectomy, laparoscopic  2003  . Cataract extraction w/ intraocular lens implant Left 5/20//2013  . Colonic polyp removal  1965  . Colonoscopy  08/29/2006    hemorrhoids, 3 small polyps (hyperplastic) & diverticulosis    Patient Care Team: Estill Dooms, MD as PCP - General (Internal Medicine) Man Mast X, NP as Nurse Practitioner (Nurse Practitioner)  Social History     Social History  . Marital Status: Married    Spouse Name: N/A  . Number of Children: N/A  . Years of Education: N/A   Occupational History  . retired Advertising account executive    Social History Main Topics  . Smoking status: Former Smoker -- 15 years    Types: Cigarettes    Quit date: 01/30/1963  . Smokeless tobacco: Never Used  . Alcohol Use: 1.2 oz/week    1 Glasses of wine, 1 Cans of beer per week     Comment: 14 per week  . Drug Use: No  . Sexual Activity: Not on file   Other Topics Concern  . Not on file   Social History Narrative   Lives at [facility] since 01/20/2014   Married [name]   Former smoker stopped 1964   Alcohlol 14 drinks per week   Exercise walks daily   Living Will, POA             reports that he quit smoking about 52 years ago. His smoking use included Cigarettes. He quit after 15 years of use. He has never used smokeless tobacco. He reports that he drinks about 1.2 oz of alcohol per week. He reports that he does not use illicit drugs.  Family History  Problem Relation Age of Onset  . Cancer Mother     breast  . Pneumonia Father   . Cancer Brother    Family Status  Relation Status Death Age  . Mother Deceased 13  . Father Deceased 67  . Brother Deceased 47  . Son Alive     Immunization History  Administered Date(s) Administered  . DTaP 09/01/2007  . Influenza-Unspecified 07/04/2014, 05/19/2015  . Pneumococcal Polysaccharide-23 06/11/2002    Allergies  Allergen Reactions  . Lyrica [Pregabalin]     *Anticonvulsants*, fatique    Medications: Patient's Medications  New Prescriptions   No medications on file  Previous Medications   ACETAMINOPHEN (TYLENOL) 325 MG TABLET    Take 650 mg by mouth. Take one tablet as needed   FENOFIBRATE (TRICOR) 48 MG TABLET    TAKE 1 TABLET BY MOUTH DAILY TO LOWER TRIGLYCERIDES   FUROSEMIDE (LASIX) 40 MG TABLET    One daily to help control edema   GABAPENTIN (NEURONTIN) 300 MG CAPSULE    Two each night to  help pain from neuropathy   GABAPENTIN (NEURONTIN) 300 MG CAPSULE    Take 300 mg by mouth. Take one tablet in morning to help pain from neuropathy   MULTIPLE VITAMIN (MULTIVITAMIN) TABLET    Take 1 tablet by mouth daily.   PANTOPRAZOLE (PROTONIX) 40 MG TABLET    TAKE 1 TABLET BY MOUTH DAILY TO REDUCE STOMACH ACID   VITAMIN B-12 (CYANOCOBALAMIN) 1000 MCG TABLET    Take 1,000 mcg by mouth daily.  Modified Medications   No medications on file  Discontinued Medications   No medications on file    Review of Systems  Constitutional: Positive for fatigue. Negative for fever, chills, diaphoresis, activity change, appetite change and unexpected  weight change.  HENT: Negative for congestion, dental problem, ear pain, hearing loss, mouth sores and rhinorrhea.   Eyes: Negative for photophobia, pain, discharge, redness and visual disturbance.  Respiratory: Negative for apnea, cough, choking, chest tightness, shortness of breath and wheezing.   Cardiovascular: Positive for leg swelling. Negative for chest pain and palpitations.  Gastrointestinal: Positive for constipation. Negative for nausea, abdominal pain, diarrhea, blood in stool and abdominal distention.  Endocrine: Negative.   Genitourinary: Negative.   Musculoskeletal: Positive for gait problem (using cane). Negative for myalgias, back pain, joint swelling, arthralgias, neck pain and neck stiffness.  Skin:       Multiple seborrheic keratoses. Corn on the tip pf the left 3rd toe that has a hemorrhage close to the nail.Marland Kitchen Has become much more painful since he stopped his toe while "working out".  Allergic/Immunologic: Negative.   Neurological: Positive for weakness and numbness. Negative for tremors, seizures, syncope and headaches.       Complaint of loss of motor skills and changes in. He is unstable with walking. He has a known peripheral neuropathy of uncertain origin. Micrographia.  Hematological:       History of anemia   Psychiatric/Behavioral: Positive for dysphoric mood. The patient is nervous/anxious.     Filed Vitals:   08/11/15 1137  BP: 150/88  Pulse: 80  Temp: 98.1 F (36.7 C)  Resp: 16  Height: 5' 8"  (1.727 m)  Weight: 158 lb (71.668 kg)   Body mass index is 24.03 kg/(m^2).  Physical Exam  Constitutional: He is oriented to person, place, and time.  Thin. Elderly.  HENT:  Right Ear: External ear normal.  Left Ear: External ear normal.  Nose: Nose normal.  Mouth/Throat: Oropharynx is clear and moist. No oropharyngeal exudate.  Eyes: Conjunctivae are normal. Pupils are equal, round, and reactive to light.  Neck: No JVD present. No tracheal deviation present. No thyromegaly present.  Cardiovascular: Normal rate, regular rhythm, normal heart sounds and intact distal pulses.  Exam reveals no gallop and no friction rub.   No murmur heard. Pulmonary/Chest: No respiratory distress. He has no wheezes. He has no rales. He exhibits no tenderness.  Abdominal: He exhibits no distension and no mass. There is no tenderness.  Musculoskeletal: Normal range of motion. He exhibits edema (1+ ipedal). He exhibits no tenderness.  uunstable gait  Lymphadenopathy:    He has no cervical adenopathy.  Neurological: He is alert and oriented to person, place, and time. He displays abnormal reflex (Absent at both knees). No cranial nerve deficit. Coordination normal.  Cogwheeling most evident in the right wrist. Gait is mildly abnormal with a suggestion of a festinating gait. Speech is soft and mildly pressured. There is a mild tremor at rest. There is no focal weakness. 1/14//15 MMSE 30/30. Passed clock drawing test.  Skin: No rash noted. No erythema. No pallor.  Psychiatric:  Patient appears mildly anxious.     Labs reviewed: Lab Summary Latest Ref Rng 03/07/2015 05/24/2014  Hemoglobin 13.5 - 17.5 g/dL 12.3(A) 12.1(A)  Hematocrit 41 - 53 % 36(A) 35(A)  White count - 4.9 5.2  Platelet count 150 - 399 K/L 160  156  Sodium 137 - 147 mmol/L 140 138  Potassium 3.4 - 5.3 mmol/L 4.1 4.2  Calcium - (None) (None)  Phosphorus - (None) (None)  Creatinine 0.6 - 1.3 mg/dL 1.2 1.2  AST 14 - 40 U/L 17 12(A)  Alk Phos 25 - 125 U/L 50 48  Bilirubin - (None) (None)  Glucose -  95 90  Cholesterol 0 - 200 mg/dL (None) 166  HDL cholesterol 35 - 70 mg/dL (None) 45  Triglycerides 40 - 160 mg/dL (None) 54  LDL Direct - (None) (None)  LDL Calc - (None) 110  Total protein - (None) (None)  Albumin - (None) (None)   Lab Results  Component Value Date   BUN 17 03/07/2015   No results found for: HGBA1C Lab Results  Component Value Date   TSH 3.15 05/24/2014     Dg Hip Unilat With Pelvis 2-3 Views Left  08/09/2015  CLINICAL DATA:  Fall 6 days ago with persistent left hip pain, initial encounter EXAM: DG HIP (WITH OR WITHOUT PELVIS) 2-3V LEFT COMPARISON:  None. FINDINGS: Degenerative changes of the hip joints are noted bilaterally with small overhanging osteophytes arising from the femoral heads bilaterally. Pelvic ring is intact. Degenerative changes of the lumbar spine are seen. No soft tissue abnormality is noted. IMPRESSION: Degenerative change without acute abnormality. Electronically Signed   By: Inez Catalina M.D.   On: 08/09/2015 13:54     Assessment/Plan  1. Pain in left hip Physical therapy  2. Parkinsonian features Continue current medication  3. Slow transit constipation Barium enema scheduled for 08/19/2015 Add lactulose 2 ounces twice daily

## 2015-08-18 ENCOUNTER — Encounter: Payer: Self-pay | Admitting: Internal Medicine

## 2015-08-18 ENCOUNTER — Non-Acute Institutional Stay: Payer: Medicare Other | Admitting: Internal Medicine

## 2015-08-18 DIAGNOSIS — R531 Weakness: Secondary | ICD-10-CM

## 2015-08-18 NOTE — Progress Notes (Signed)
Patient ID: [facility], male   DOB: 07/20/1926, 81 y.o.   MRN: [id number]    [facility] Room Number: AL 33  Place of Service: ALF (13)     Allergies  Allergen Reactions  . Lyrica [Pregabalin]     *Anticonvulsants*, fatique    Chief Complaint  Patient presents with  . Acute Visit    Increasing loss of self-care skills    HPI:  Patient was moved from independent living to assisted living area of [facility] on 08/10/2015. Staff believes that since his arrival, they have noted decreasing ability to perform much self-care. He is now lacking the ability to remove his briefs, button his shirt, or safely tend to personal care needs. They're asking if he should be moved to the skilled nursing area.  Patient is undergoing a prep for his barium enema tomorrow.  Patient says he is aware that he is declining in his abilities to do much for himself. His wife is currently in the assisted living area. He would prefer to remain on the assisted living area and start physical and occupational therapy while in assisted living. Other than generalized weakness, he denies any other specific current issues with pain, nausea, dizziness, etc.  Medications: Patient's Medications  New Prescriptions   No medications on file  Previous Medications   ACETAMINOPHEN (TYLENOL) 325 MG TABLET    Take 650 mg by mouth. Take one tablet as needed   FENOFIBRATE (TRICOR) 48 MG TABLET    TAKE 1 TABLET BY MOUTH DAILY TO LOWER TRIGLYCERIDES   FUROSEMIDE (LASIX) 40 MG TABLET    One daily to help control edema   GABAPENTIN (NEURONTIN) 300 MG CAPSULE    Two each night to help pain from neuropathy   GABAPENTIN (NEURONTIN) 300 MG CAPSULE    Take 300 mg by mouth. Take one tablet in morning to help pain from neuropathy   LACTULOSE 20 GM/30ML SOLN    Take 2 ounces twice daily for laxative. May mix in water or juice.   MULTIPLE VITAMIN (MULTIVITAMIN) TABLET    Take 1 tablet by mouth daily.   PANTOPRAZOLE (PROTONIX) 40 MG TABLET    TAKE 1 TABLET BY MOUTH DAILY TO REDUCE STOMACH ACID   VITAMIN B-12 (CYANOCOBALAMIN) 1000 MCG TABLET    Take 1,000 mcg by mouth daily.  Modified Medications   No medications on file  Discontinued Medications   No medications on file     Review of Systems  Constitutional: Positive for fatigue. Negative for fever, chills, diaphoresis, activity change, appetite change and unexpected weight change.  HENT: Negative for congestion, dental problem, ear pain, hearing loss, mouth sores and rhinorrhea.   Eyes: Negative for photophobia, pain, discharge, redness and visual disturbance.  Respiratory: Negative for apnea, cough, choking, chest tightness, shortness of breath and wheezing.   Cardiovascular: Positive for leg swelling. Negative for chest pain and palpitations.  Gastrointestinal: Positive for constipation. Negative for nausea, abdominal pain, diarrhea, blood in stool and abdominal distention.  Endocrine: Negative.   Genitourinary: Negative.   Musculoskeletal: Positive for gait problem (using cane). Negative for myalgias, back pain, joint swelling, arthralgias, neck pain and neck stiffness.  Skin:       Multiple seborrheic keratoses. Corn on the tip pf the left 3rd toe that has a hemorrhage close to the nail.Marland Kitchen Has become much more painful since he stopped his toe while "working out".  Allergic/Immunologic: Negative.   Neurological: Positive for weakness and numbness. Negative for  tremors, seizures, syncope and headaches.       Complaint of loss of motor skills and changes in. He is unstable with walking. He has a known peripheral neuropathy of uncertain origin. Micrographia.  Hematological:       History of anemia  Psychiatric/Behavioral: Positive for dysphoric mood. The patient is nervous/anxious.     Filed Vitals:   08/18/15 1309  BP: 126/67  Pulse: 65  Temp: 99.1 F (37.3 C)  Resp: 20  Height: 5' 8"  (1.727 m)  Weight: 137 lb (62.143 kg)    Wt Readings from Last 3 Encounters:  08/18/15 137 lb (62.143 kg)  08/11/15 158 lb (71.668 kg)  06/07/15 156 lb 9.6 oz (71.033 kg)    Body mass index is 20.84 kg/(m^2).  Physical Exam  Constitutional: He is oriented to person, place, and time.  Thin. Elderly.  HENT:  Right Ear: External ear normal.  Left Ear: External ear normal.  Nose: Nose normal.  Mouth/Throat: Oropharynx is clear and moist. No oropharyngeal exudate.  Eyes: Conjunctivae are normal. Pupils are equal, round, and reactive to light.  Neck: No JVD present. No tracheal deviation present. No thyromegaly present.  Cardiovascular: Normal rate, regular rhythm, normal heart sounds and intact distal pulses.  Exam reveals no gallop and no friction rub.   No murmur heard. Pulmonary/Chest: No respiratory distress. He has no wheezes. He has no rales. He exhibits no tenderness.  Abdominal: He exhibits no distension and no mass. There is no tenderness.  Musculoskeletal: Normal range of motion. He exhibits edema (1+ ipedal). He exhibits no tenderness.  uunstable gait  Lymphadenopathy:    He has no cervical adenopathy.  Neurological: He is alert and oriented to person, place, and time. He displays abnormal reflex (Absent at both knees). No cranial nerve deficit. Coordination normal.  Cogwheeling most evident in the right wrist. Gait is mildly abnormal with a suggestion of a festinating gait. Speech is soft and mildly pressured. There is a mild tremor at rest. There is no focal weakness. 1/14//15 MMSE 30/30. Passed clock drawing test.  Skin: No rash noted. No erythema. No pallor.  Psychiatric:  Patient appears mildly anxious.      Labs reviewed: Lab Summary Latest Ref Rng 03/07/2015 05/24/2014  Hemoglobin 13.5 - 17.5 g/dL 12.3(A) 12.1(A)  Hematocrit 41 - 53 % 36(A) 35(A)  White count - 4.9 5.2  Platelet count 150 - 399 K/L 160 156  Sodium 137 - 147 mmol/L 140 138  Potassium 3.4 - 5.3 mmol/L 4.1 4.2  Calcium - (None) (None)   Phosphorus - (None) (None)  Creatinine 0.6 - 1.3 mg/dL 1.2 1.2  AST 14 - 40 U/L 17 12(A)  Alk Phos 25 - 125 U/L 50 48  Bilirubin - (None) (None)  Glucose - 95 90  Cholesterol 0 - 200 mg/dL (None) 166  HDL cholesterol 35 - 70 mg/dL (None) 45  Triglycerides 40 - 160 mg/dL (None) 54  LDL Direct - (None) (None)  LDL Calc - (None) 110  Total protein - (None) (None)  Albumin - (None) (None)   Lab Results  Component Value Date   TSH 3.15 05/24/2014   Lab Results  Component Value Date   BUN 17 03/07/2015   BUN 15 05/24/2014   Lab Results  Component Value Date   CREATININE 1.2 03/07/2015   CREATININE 1.2 05/24/2014   No results found for: HGBA1C     Assessment/Plan  1. Weak Patient will be enrolled in physical therapy and occupational therapy to  try to increase strength and self-care skills. We will initiate these therapies while in assisted living. I will reassess next week and if he needs to go to skilled care I will consider it at that time.  Patient is having his barium enema tomorrow and we should have results soon after that.

## 2015-08-19 ENCOUNTER — Ambulatory Visit
Admission: RE | Admit: 2015-08-19 | Discharge: 2015-08-19 | Disposition: A | Discharge status: Home / Self Care | Payer: Medicare Other | Source: Ambulatory Visit | Attending: Internal Medicine | Admitting: Internal Medicine

## 2015-08-19 DIAGNOSIS — M25552 Pain in left hip: Secondary | ICD-10-CM

## 2015-08-19 DIAGNOSIS — K5900 Constipation, unspecified: Secondary | ICD-10-CM | POA: Diagnosis not present

## 2015-08-19 DIAGNOSIS — K5901 Slow transit constipation: Secondary | ICD-10-CM

## 2015-08-19 DIAGNOSIS — R102 Pelvic and perineal pain: Secondary | ICD-10-CM | POA: Diagnosis not present

## 2015-08-23 ENCOUNTER — Non-Acute Institutional Stay: Payer: Medicare Other | Admitting: Nurse Practitioner

## 2015-08-23 ENCOUNTER — Encounter: Payer: Self-pay | Admitting: Nurse Practitioner

## 2015-08-23 DIAGNOSIS — W19XXXS Unspecified fall, sequela: Secondary | ICD-10-CM | POA: Diagnosis not present

## 2015-08-23 DIAGNOSIS — K5900 Constipation, unspecified: Secondary | ICD-10-CM | POA: Diagnosis not present

## 2015-08-23 DIAGNOSIS — I10 Essential (primary) hypertension: Secondary | ICD-10-CM | POA: Diagnosis not present

## 2015-08-23 DIAGNOSIS — G603 Idiopathic progressive neuropathy: Secondary | ICD-10-CM

## 2015-08-23 DIAGNOSIS — W19XXXA Unspecified fall, initial encounter: Secondary | ICD-10-CM | POA: Insufficient documentation

## 2015-08-23 DIAGNOSIS — R609 Edema, unspecified: Secondary | ICD-10-CM | POA: Diagnosis not present

## 2015-08-23 NOTE — Assessment & Plan Note (Addendum)
08/19/15 Colon with water solution contrast showed excluding a complete sigmoid obstruction. No c/o constipation. Continue Senna S II hs, MiraLax daily, Lactulose 72ml bid.

## 2015-08-23 NOTE — Assessment & Plan Note (Signed)
08/19/15 fall, no apparent injury noted, multiple factorials. Close supervision needed for safety.

## 2015-08-23 NOTE — Assessment & Plan Note (Signed)
Controlled, continue Furosemide 40mg  daily.

## 2015-08-23 NOTE — Progress Notes (Signed)
Patient ID: Lawrence Turner, male   DOB: 1925/09/29, 82 y.o.   MRN: [id number]  [facility]  [facility] Provider:  Marlana Latus NP  Code Status:  DNR Goals of care: Advanced Directive information    No chief complaint on file.    HPI: Patient is a 82 y.o. male seen in the [facility] at [facility] today for evaluation of  constipation and other chronic medical conditions. 08/19/15 Colon with water solution contrast showed excluding a complete sigmoid obstruction.   Review of Systems  Constitutional: Negative for fever, chills and diaphoresis.  HENT: Negative for congestion, ear pain and hearing loss.   Eyes: Negative for photophobia, pain, discharge and redness.  Respiratory: Negative for cough, shortness of breath and wheezing.   Cardiovascular: Positive for leg swelling. Negative for chest pain and palpitations.       Minimal swelling in ankles.   Gastrointestinal: Positive for constipation. Negative for nausea, abdominal pain, diarrhea and blood in stool.  Genitourinary: Negative.   Musculoskeletal: Negative for myalgias, back pain and neck pain.  Skin:       Multiple seborrheic keratoses. Corn on the tip pf the left 3rd toe that has a hemorrhage close to the nail.Lawrence Kitchen Has become much more painful since he stopped his toe while "working out".  Neurological: Positive for weakness. Negative for tremors, seizures and headaches.       Complaint of loss of motor skills and changes in. He is unstable with walking. He has a known peripheral neuropathy of uncertain origin. Micrographia.  Endo/Heme/Allergies:       History of anemia  Psychiatric/Behavioral: The patient is nervous/anxious.     Past Medical History  Diagnosis Date  . Mononeuritis of unspecified site   . Unspecified constipation   . Unspecified hemorrhoids without mention of complication   . Benign neoplasm of colon     pre-cancerous polyps removed at colonoscopy 2008  . Pure hypercholesterolemia   . Unspecified vitamin D deficiency     . Unspecified gastritis and gastroduodenitis without mention of hemorrhage 08/29/2006  . Other pulmonary embolism and infarction     following cholecystectomy 2003  . Anemia, unspecified   . Hypertrophy of prostate without urinary obstruction and other lower urinary tract symptoms (LUTS)   . Other B-complex deficiencies   . 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 Rosacea   . Osteoarthrosis, unspecified whether generalized or localized, pelvic region and thigh   . Diverticulosis of colon (without mention of hemorrhage)   . Contact dermatitis and other eczema, due to unspecified cause   . Esophageal reflux   . Unspecified essential hypertension   . Peripheral neuropathy [facility])     Patient Active Problem List   Diagnosis Date Noted  . Fall 08/23/2015  . Weak 08/18/2015  . Pain in left hip 08/09/2015  . Edema 03/15/2015  . Constipation 10/05/2014  . Corn 10/05/2014  . Dysphagia 07/29/2014  . Parkinsonian features 02/02/2014  . Essential hypertension   . Esophageal reflux   . Peripheral neuropathy ([place])   . Hypertrophy of prostate without urinary obstruction and other lower urinary tract symptoms (LUTS)   . Other B-complex deficiencies   . Anemia   . Unspecified vitamin D deficiency   . Pure hypercholesterolemia   . Benign neoplasm of colon   . Unspecified constipation     Allergies  Allergen Reactions  . Lyrica [Pregabalin]     *Anticonvulsants*, fatique    Medications: Patient's Medications  New Prescriptions  No medications on file  Previous Medications   ACETAMINOPHEN (TYLENOL) 325 MG TABLET    Take 650 mg by mouth. Take one tablet as needed   FENOFIBRATE (TRICOR) 48 MG TABLET    TAKE 1 TABLET BY MOUTH DAILY TO LOWER TRIGLYCERIDES   FUROSEMIDE (LASIX) 40 MG TABLET    One daily to help control edema   GABAPENTIN (NEURONTIN) 300 MG CAPSULE    Two each night to help pain from neuropathy   GABAPENTIN (NEURONTIN) 300 MG CAPSULE    Take 300 mg  by mouth. Take one tablet in morning to help pain from neuropathy   LACTULOSE 20 GM/30ML SOLN    Take 2 ounces twice daily for laxative. May mix in water or juice.   MULTIPLE VITAMIN (MULTIVITAMIN) TABLET    Take 1 tablet by mouth daily.   PANTOPRAZOLE (PROTONIX) 40 MG TABLET    TAKE 1 TABLET BY MOUTH DAILY TO REDUCE STOMACH ACID   VITAMIN B-12 (CYANOCOBALAMIN) 1000 MCG TABLET    Take 1,000 mcg by mouth daily.  Modified Medications   No medications on file  Discontinued Medications   No medications on file    Physical Exam: Filed Vitals:   08/23/15 1548  BP: 136/88  Pulse: 88  Temp: 98.4 F (36.9 C)  TempSrc: Tympanic  Resp: 20   There is no weight on file to calculate BMI.  Physical Exam  Constitutional: He is oriented to person, place, and time.  Thin. Elderly.  HENT:  Right Ear: External ear normal.  Left Ear: External ear normal.  Nose: Nose normal.  Mouth/Throat: Oropharynx is clear and moist. No oropharyngeal exudate.  Eyes: Conjunctivae are normal. Pupils are equal, round, and reactive to light.  Neck: No JVD present. No tracheal deviation present. No thyromegaly present.  Cardiovascular: Normal rate, regular rhythm, normal heart sounds and intact distal pulses.  Exam reveals no gallop and no friction rub.   No murmur heard. Pulmonary/Chest: No respiratory distress. He has no wheezes. He has no rales. He exhibits no tenderness.  Abdominal: He exhibits no distension and no mass. There is no tenderness.  Musculoskeletal: Normal range of motion. He exhibits edema (1+ ipedal). He exhibits no tenderness.  uunstable gait  Lymphadenopathy:    He has no cervical adenopathy.  Neurological: He is alert and oriented to person, place, and time. He displays abnormal reflex (Absent at both knees). No cranial nerve deficit. Coordination normal.  Cogwheeling most evident in the right wrist. Gait is mildly abnormal with a suggestion of a festinating gait. Speech is soft and mildly  pressured. There is a mild tremor at rest. There is no focal weakness. 1/14//15 MMSE 30/30. Passed clock drawing test.  Skin: No rash noted. No erythema. No pallor.  Psychiatric:  Patient appears mildly anxious.     Labs reviewed: Basic Metabolic Panel:  Recent Labs  03/07/15  NA 140  K 4.1  BUN 17  CREATININE 1.2    Liver Function Tests:  Recent Labs  03/07/15  AST 17  ALT 13  ALKPHOS 50    CBC:  Recent Labs  03/07/15  WBC 4.9  HGB 12.3*  HCT 36*  PLT 160    Lab Results  Component Value Date   TSH 3.15 05/24/2014   No results found for: HGBA1C Lab Results  Component Value Date   CHOL 166 05/24/2014   HDL 45 05/24/2014   LDLCALC 110 05/24/2014   TRIG 54 05/24/2014    Significant Diagnostic Results since last  visit: none  Patient Care Team: Estill Dooms, MD as PCP - General (Internal Medicine) Kerry Chisolm X, NP as Nurse Practitioner (Nurse Practitioner)  Assessment/Plan Problem List Items Addressed This Visit    Peripheral neuropathy ([place])    Pain is controlled, continue Gabapentin 300mg  bid.       Fall    08/19/15 fall, no apparent injury noted, multiple factorials. Close supervision needed for safety.       Essential hypertension - Primary    Controlled, continue Furosemide 40mg  daily.       Edema    Minimal, continue Furosemide 40mg  daily.       Constipation    08/19/15 Colon with water solution contrast showed excluding a complete sigmoid obstruction. No c/o constipation. Continue Senna S II hs, MiraLax daily, Lactulose 41ml bid.           Family/ staff Communication: hx of constipation.   Labs/tests ordered: none  ManXie Lawrence Whitefield NP [facility] [address], [place] [address] On Call:  [phone] & follow prompts after 5pm & weekends Office Phone:  [phone] Office Fax:  [phone]

## 2015-08-23 NOTE — Assessment & Plan Note (Signed)
Minimal, continue Furosemide 40mg  daily.

## 2015-08-23 NOTE — Assessment & Plan Note (Signed)
Pain is controlled, continue Gabapentin 300mg  bid.

## 2015-09-09 ENCOUNTER — Encounter: Payer: Self-pay | Admitting: Nurse Practitioner

## 2015-09-09 ENCOUNTER — Non-Acute Institutional Stay: Payer: Medicare Other | Admitting: Nurse Practitioner

## 2015-09-09 DIAGNOSIS — D649 Anemia, unspecified: Secondary | ICD-10-CM | POA: Diagnosis not present

## 2015-09-09 DIAGNOSIS — K5900 Constipation, unspecified: Secondary | ICD-10-CM | POA: Diagnosis not present

## 2015-09-09 DIAGNOSIS — K219 Gastro-esophageal reflux disease without esophagitis: Secondary | ICD-10-CM | POA: Diagnosis not present

## 2015-09-09 DIAGNOSIS — R609 Edema, unspecified: Secondary | ICD-10-CM | POA: Diagnosis not present

## 2015-09-09 DIAGNOSIS — I10 Essential (primary) hypertension: Secondary | ICD-10-CM | POA: Diagnosis not present

## 2015-09-09 DIAGNOSIS — G629 Polyneuropathy, unspecified: Secondary | ICD-10-CM

## 2015-09-09 DIAGNOSIS — R259 Unspecified abnormal involuntary movements: Secondary | ICD-10-CM | POA: Diagnosis not present

## 2015-09-09 NOTE — Assessment & Plan Note (Signed)
Update CBC, continue Vit B12 daily.

## 2015-09-09 NOTE — Progress Notes (Signed)
Patient ID: Lawrence Turner, male   DOB: March 29, 1926, 82 y.o.   MRN: [id number]  [facility] Provider:  Marlana Latus NP  Code Status:  DNR Goals of care: Advanced Directive information    Chief Complaint  Patient presents with  . Medical Management of Chronic Issues  . Acute Visit    BLE edema     HPI: Patient is a 82 y.o. male seen in the [facility] at [facility] today for evaluation of  BLE edema, chronic, reportedly worsened in the past a few days, no noted increased SOB or sputum production, no O2 desaturation, constipation and other chronic medical conditions. 08/19/15 Colon with water solution contrast showed excluding a complete sigmoid obstruction.   Review of Systems  Constitutional: Negative for fever, chills and diaphoresis.  HENT: Negative for congestion, ear pain and hearing loss.   Eyes: Negative for photophobia, pain, discharge and redness.  Respiratory: Negative for cough, shortness of breath and wheezing.   Cardiovascular: Positive for leg swelling. Negative for chest pain and palpitations.       BLE edema  Gastrointestinal: Positive for constipation. Negative for nausea, abdominal pain, diarrhea and blood in stool.  Genitourinary: Negative.   Musculoskeletal: Negative for myalgias, back pain and neck pain.  Skin:       Multiple seborrheic keratoses. Corn on the tip pf the left 3rd toe that has a hemorrhage close to the nail.Marland Kitchen Has become much more painful since he stopped his toe while "working out".  Neurological: Positive for weakness. Negative for tremors, seizures and headaches.       Complaint of loss of motor skills and changes in. He is unstable with walking. He has a known peripheral neuropathy of uncertain origin. Micrographia.  Endo/Heme/Allergies:       History of anemia  Psychiatric/Behavioral: The patient is nervous/anxious.     Past Medical History  Diagnosis Date  . Mononeuritis of unspecified site   . Unspecified constipation   . Unspecified  hemorrhoids without mention of complication   . Benign neoplasm of colon     pre-cancerous polyps removed at colonoscopy 2008  . Pure hypercholesterolemia   . Unspecified vitamin D deficiency   . Unspecified gastritis and gastroduodenitis without mention of hemorrhage 08/29/2006  . Other pulmonary embolism and infarction     following cholecystectomy 2003  . Anemia, unspecified   . Hypertrophy of prostate without urinary obstruction and other lower urinary tract symptoms (LUTS)   . Other B-complex deficiencies   . 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 Rosacea   . Osteoarthrosis, unspecified whether generalized or localized, pelvic region and thigh   . Diverticulosis of colon (without mention of hemorrhage)   . Contact dermatitis and other eczema, due to unspecified cause   . Esophageal reflux   . Unspecified essential hypertension   . Peripheral neuropathy [facility])     Patient Active Problem List   Diagnosis Date Noted  . Fall 08/23/2015  . Weak 08/18/2015  . Pain in left hip 08/09/2015  . Edema 03/15/2015  . Constipation 10/05/2014  . Corn 10/05/2014  . Dysphagia 07/29/2014  . Parkinsonian features 02/02/2014  . Essential hypertension   . Esophageal reflux   . Peripheral neuropathy ([place])   . Hypertrophy of prostate without urinary obstruction and other lower urinary tract symptoms (LUTS)   . Other B-complex deficiencies   . Anemia   . Unspecified vitamin D deficiency   . Pure hypercholesterolemia   . Benign  neoplasm of colon   . Unspecified constipation     Allergies  Allergen Reactions  . Lyrica [Pregabalin]     *Anticonvulsants*, fatique    Medications: Patient's Medications  New Prescriptions   No medications on file  Previous Medications   ACETAMINOPHEN (TYLENOL) 325 MG TABLET    Take 650 mg by mouth. Take one tablet as needed   FENOFIBRATE (TRICOR) 48 MG TABLET    TAKE 1 TABLET BY MOUTH DAILY TO LOWER TRIGLYCERIDES   FUROSEMIDE  (LASIX) 40 MG TABLET    One daily to help control edema   GABAPENTIN (NEURONTIN) 300 MG CAPSULE    Two each night to help pain from neuropathy   GABAPENTIN (NEURONTIN) 300 MG CAPSULE    Take 300 mg by mouth. Take one tablet in morning to help pain from neuropathy   LACTULOSE 20 GM/30ML SOLN    Take 2 ounces twice daily for laxative. May mix in water or juice.   MULTIPLE VITAMIN (MULTIVITAMIN) TABLET    Take 1 tablet by mouth daily.   PANTOPRAZOLE (PROTONIX) 40 MG TABLET    TAKE 1 TABLET BY MOUTH DAILY TO REDUCE STOMACH ACID   VITAMIN B-12 (CYANOCOBALAMIN) 1000 MCG TABLET    Take 1,000 mcg by mouth daily.  Modified Medications   No medications on file  Discontinued Medications   No medications on file    Physical Exam: Filed Vitals:   09/09/15 1229  BP: 122/64  Pulse: 64  Temp: 99.1 F (37.3 C)  TempSrc: Tympanic  Resp: 20   There is no weight on file to calculate BMI.  Physical Exam  Constitutional: He is oriented to person, place, and time.  Thin. Elderly.  HENT:  Right Ear: External ear normal.  Left Ear: External ear normal.  Nose: Nose normal.  Mouth/Throat: Oropharynx is clear and moist. No oropharyngeal exudate.  Eyes: Conjunctivae are normal. Pupils are equal, round, and reactive to light.  Neck: No JVD present. No tracheal deviation present. No thyromegaly present.  Cardiovascular: Normal rate, regular rhythm, normal heart sounds and intact distal pulses.  Exam reveals no gallop and no friction rub.   No murmur heard. Pulmonary/Chest: No respiratory distress. He has no wheezes. He has no rales. He exhibits no tenderness.  Abdominal: He exhibits no distension and no mass. There is no tenderness.  Musculoskeletal: Normal range of motion. He exhibits edema (1+ ipedal). He exhibits no tenderness.  uunstable gait. 1+BLE edema.   Lymphadenopathy:    He has no cervical adenopathy.  Neurological: He is alert and oriented to person, place, and time. He displays abnormal  reflex (Absent at both knees). No cranial nerve deficit. Coordination normal.  Cogwheeling most evident in the right wrist. Gait is mildly abnormal with a suggestion of a festinating gait. Speech is soft and mildly pressured. There is a mild tremor at rest. There is no focal weakness. 1/14//15 MMSE 30/30. Passed clock drawing test.  Skin: No rash noted. No erythema. No pallor.  Psychiatric:  Patient appears mildly anxious.     Labs reviewed: Basic Metabolic Panel:  Recent Labs  03/07/15  NA 140  K 4.1  BUN 17  CREATININE 1.2    Liver Function Tests:  Recent Labs  03/07/15  AST 17  ALT 13  ALKPHOS 50    CBC:  Recent Labs  03/07/15  WBC 4.9  HGB 12.3*  HCT 36*  PLT 160    Lab Results  Component Value Date   TSH 3.15 05/24/2014  No results found for: HGBA1C Lab Results  Component Value Date   CHOL 166 05/24/2014   HDL 45 05/24/2014   LDLCALC 110 05/24/2014   TRIG 54 05/24/2014    Significant Diagnostic Results since last visit: none  Patient Care Team: Estill Dooms, MD as PCP - General (Internal Medicine) Kathyjo Briere X, NP as Nurse Practitioner (Nurse Practitioner)  Assessment/Plan Problem List Items Addressed This Visit    Peripheral neuropathy ([place])    Pain is controlled, continue Gabapentin 300mg  bid.      Parkinsonian features (Chronic)    Cogwheeling of the right wrist. Pressured speech. Soft-spoken. Change in handwriting. Unstable gait.      Essential hypertension    Controlled, continue Furosemide 10mg  daily.       Esophageal reflux    Stable, continue Protonix daily.       Edema    Worsened since off Furosemide 40mg  daily. Resume Furosemide 10mg  daily, update CMP, BNP. Wt daily ac breakfast.       Constipation    08/19/15 Colon with water solution contrast showed excluding a complete sigmoid obstruction. No c/o constipation. Continue Senna S II hs, MiraLax daily, Lactulose 62ml bid. Stable, continue Senokot S I daily, MiraLax and  Lactulose prn. Check TSH      Anemia - Primary    Update CBC, continue Vit B12 daily.           Family/ staff Communication: hx of constipation.   Labs/tests ordered: CBC, CMP, TSH, BNP  ManXie Giavonna Pflum NP [facility] [address], [place] [address] On Call:  [phone] & follow prompts after 5pm & weekends Office Phone:  [phone] Office Fax:  [phone]

## 2015-09-09 NOTE — Assessment & Plan Note (Signed)
Pain is controlled, continue Gabapentin 300mg  bid.

## 2015-09-09 NOTE — Assessment & Plan Note (Signed)
Controlled, continue Furosemide 10mg  daily.

## 2015-09-09 NOTE — Assessment & Plan Note (Addendum)
Worsened since off Furosemide 40mg  daily. Resume Furosemide 10mg  daily, update CMP, BNP. Wt daily ac breakfast.

## 2015-09-09 NOTE — Assessment & Plan Note (Signed)
Cogwheeling of the right wrist. Pressured speech. Soft-spoken. Change in handwriting. Unstable gait.

## 2015-09-09 NOTE — Assessment & Plan Note (Addendum)
08/19/15 Colon with water solution contrast showed excluding a complete sigmoid obstruction. No c/o constipation. Continue Senna S II hs, MiraLax daily, Lactulose 46ml bid. Stable, continue Senokot S I daily, MiraLax and Lactulose prn. Check TSH

## 2015-09-09 NOTE — Assessment & Plan Note (Signed)
Stable, continue Protonix daily.  

## 2015-09-12 DIAGNOSIS — D510 Vitamin B12 deficiency anemia due to intrinsic factor deficiency: Secondary | ICD-10-CM | POA: Diagnosis not present

## 2015-09-12 DIAGNOSIS — I5023 Acute on chronic systolic (congestive) heart failure: Secondary | ICD-10-CM | POA: Diagnosis not present

## 2015-09-12 DIAGNOSIS — E1165 Type 2 diabetes mellitus with hyperglycemia: Secondary | ICD-10-CM | POA: Diagnosis not present

## 2015-09-12 LAB — BASIC METABOLIC PANEL
BUN: 16 mg/dL (ref 4–21)
CREATININE: 1 mg/dL (ref 0.6–1.3)
Glucose: 87 mg/dL
POTASSIUM: 4 mmol/L (ref 3.4–5.3)
SODIUM: 139 mmol/L (ref 137–147)

## 2015-09-12 LAB — CBC AND DIFFERENTIAL
HCT: 31 % — AB (ref 41–53)
Hemoglobin: 10.4 g/dL — AB (ref 13.5–17.5)
PLATELETS: 177 10*3/uL (ref 150–399)
WBC: 5.4 10^3/mL

## 2015-09-12 LAB — TSH: TSH: 2.56 u[IU]/mL (ref <=5.90)

## 2015-09-12 LAB — HEPATIC FUNCTION PANEL
ALK PHOS: 64 U/L (ref 25–125)
ALT: 7 U/L — AB (ref 10–40)
AST: 12 U/L — AB (ref 14–40)
Bilirubin, Total: 0.5 mg/dL

## 2015-09-27 ENCOUNTER — Non-Acute Institutional Stay: Payer: Medicare Other | Admitting: Internal Medicine

## 2015-09-27 ENCOUNTER — Encounter: Payer: Self-pay | Admitting: Internal Medicine

## 2015-09-27 VITALS — BP 138/72 | HR 67 | Temp 98.0°F | Resp 20 | Ht 68.0 in | Wt 139.6 lb

## 2015-09-27 DIAGNOSIS — R259 Unspecified abnormal involuntary movements: Secondary | ICD-10-CM | POA: Diagnosis not present

## 2015-09-27 DIAGNOSIS — R609 Edema, unspecified: Secondary | ICD-10-CM | POA: Diagnosis not present

## 2015-09-27 DIAGNOSIS — M25552 Pain in left hip: Secondary | ICD-10-CM

## 2015-09-27 DIAGNOSIS — G629 Polyneuropathy, unspecified: Secondary | ICD-10-CM | POA: Diagnosis not present

## 2015-09-27 DIAGNOSIS — L84 Corns and callosities: Secondary | ICD-10-CM

## 2015-09-27 DIAGNOSIS — R531 Weakness: Secondary | ICD-10-CM

## 2015-09-27 DIAGNOSIS — K5901 Slow transit constipation: Secondary | ICD-10-CM

## 2015-09-27 DIAGNOSIS — I10 Essential (primary) hypertension: Secondary | ICD-10-CM

## 2015-09-27 DIAGNOSIS — E7800 Pure hypercholesterolemia, unspecified: Secondary | ICD-10-CM | POA: Diagnosis not present

## 2015-09-27 MED ORDER — LACTULOSE 20 GM/30ML PO SOLN: ORAL | Status: DC

## 2015-09-27 NOTE — Progress Notes (Signed)
Patient ID: Lawrence Turner, male   DOB: March 06, 1926, 82 y.o.   MRN: [id number]    [place] Room Number: AL 33  Place of Service: [facility] (31)     Allergies  Allergen Reactions  . Lyrica [Pregabalin]     *Anticonvulsants*, fatique    Chief Complaint  Patient presents with  . Medical Management of Chronic Issues    refuse to take lasix and gabapentin due to swelling    HPI:  Parkinsonian features - paucity of movement. Mild increase in overall rigidity. Shuffling gait. Gait instability with retropulsion. Failure to extinguish glabellar reflex. Speech is extremely rapid and difficult to understand. Now complaining of increasing salivary flow to the point he drools.  Edema, unspecified type - He objects to the use of furosemide because it makes him incontinent and increases dramatically the frequency of urination. Is willing to tolerate the pedal edema. Is currently about 2+ bilaterally. Some of the edema may be the result of taking gabapentin. He wants this drug discontinued.  Essential hypertension - Controlled  Pain in left hip - Improved  Slow transit constipation - Requiring fewer laxatives.  Corn - Left second toe is extremely tender at the tip. He has a hammertoe and a corn has developed with some hemorrhage around it.  Peripheral polyneuropathy ([place]) - He has not experienced enough benefit from gabapentin to warrant continuing this drug. The edema that is associated with is very unpleasant to him.  Weak - Continues to be dependent upon a sitter or staff for many ADL. He requires assistance in bathing, getting into the bathroom, tray set up at meals. He now has a sitter 5 hours per day for assistance.  Pure hypercholesterolemia - not checked recently    Medications: Patient's Medications  New Prescriptions   No medications on file  Previous Medications   ACETAMINOPHEN (TYLENOL) 325 MG TABLET    Take 650 mg by mouth. Take one tablet as needed   FENOFIBRATE (TRICOR) 48 MG TABLET    TAKE 1 TABLET BY MOUTH DAILY TO LOWER TRIGLYCERIDES   FUROSEMIDE (LASIX) 20 MG TABLET    Take 1/2 tablet =10 mg daily.   GABAPENTIN (NEURONTIN) 300 MG CAPSULE    Two each night to help pain from neuropathy   GABAPENTIN (NEURONTIN) 300 MG CAPSULE    Take 300 mg by mouth. Take one tablet in morning to help pain from neuropathy   GENERLAC 10 GM/15ML SOLN       LACTULOSE 20 GM/30ML SOLN    Take 2 ounces twice daily for laxative. May mix in water or juice.   MULTIPLE VITAMIN (MULTIVITAMIN) TABLET    Take 1 tablet by mouth daily.   OMEPRAZOLE (PRILOSEC) 20 MG CAPSULE    Take 20 mg by mouth daily.   PANTOPRAZOLE (PROTONIX) 40 MG TABLET    TAKE 1 TABLET BY MOUTH DAILY TO REDUCE STOMACH ACID   VITAMIN B-12 (CYANOCOBALAMIN) 1000 MCG TABLET    Take 1,000 mcg by mouth daily.  Modified Medications   No medications on file  Discontinued Medications   FUROSEMIDE (LASIX) 40 MG TABLET    One daily to help control edema     Review of Systems  Constitutional: Negative for fever, chills and diaphoresis.  HENT: Negative for congestion, ear pain and hearing loss.   Eyes: Negative for photophobia, pain, discharge and redness.  Respiratory: Negative for cough, shortness of breath and wheezing.   Cardiovascular: Positive for leg swelling. Negative for chest pain  and palpitations.       BLE edema  Gastrointestinal: Positive for constipation. Negative for nausea, abdominal pain, diarrhea and blood in stool.  Genitourinary: Negative.   Musculoskeletal: Negative for myalgias, back pain and neck pain.  Skin:       Multiple seborrheic keratoses.  Corn on the tip pf the left 2nd toe that has a hemorrhage close to the nail.Marland Kitchen Has become much more painful since he stubbed his toe while "working out".  Neurological: Positive for weakness. Negative for tremors, seizures and headaches.       Complaint of loss of motor skills and changes in. He is unstable with walking. He has a known  peripheral neuropathy of uncertain origin. Micrographia.  Hematological:       History of anemia  Psychiatric/Behavioral: The patient is nervous/anxious.     Filed Vitals:   09/27/15 1056  BP: 138/72  Pulse: 67  Temp: 98 F (36.7 C)  TempSrc: Oral  Resp: 20  Height: 5' 8"  (1.727 m)  Weight: 139 lb 9.6 oz (63.322 kg)  SpO2: 99%   Wt Readings from Last 3 Encounters:  09/27/15 139 lb 9.6 oz (63.322 kg)  08/18/15 137 lb (62.143 kg)  08/11/15 158 lb (71.668 kg)    Body mass index is 21.23 kg/(m^2).  Physical Exam  Constitutional: He is oriented to person, place, and time.  Thin. Elderly.  HENT:  Right Ear: External ear normal.  Left Ear: External ear normal.  Nose: Nose normal.  Mouth/Throat: Oropharynx is clear and moist. No oropharyngeal exudate.  Eyes: Conjunctivae are normal. Pupils are equal, round, and reactive to light.  Neck: No JVD present. No tracheal deviation present. No thyromegaly present.  Cardiovascular: Normal rate, regular rhythm, normal heart sounds and intact distal pulses.  Exam reveals no gallop and no friction rub.   No murmur heard. Pulmonary/Chest: No respiratory distress. He has no wheezes. He has no rales. He exhibits no tenderness.  Abdominal: He exhibits no distension and no mass. There is no tenderness.  Musculoskeletal: Normal range of motion. He exhibits edema (1+ ipedal). He exhibits no tenderness.  unstable gait. 2+BLE edema.   Lymphadenopathy:    He has no cervical adenopathy.  Neurological: He is alert and oriented to person, place, and time. He displays abnormal reflex (Absent at both knees). No cranial nerve deficit. Coordination normal.  Cogwheeling most evident in the right wrist. Gait is mildly abnormal with a suggestion of a festinating gait. Speech is soft and mildly pressured. There is a mild tremor at rest. There is no focal weakness. Fails to extinguish glabellar reflex. 1/14//15 MMSE 30/30. Passed clock drawing test.  Skin: No  rash noted. No erythema. No pallor.  Corn at the tip of the left second toe with hemorrhage.  Psychiatric:  Patient appears mildly anxious.      Labs reviewed: Lab Summary Latest Ref Rng 09/12/2015 03/07/2015 05/24/2014  Hemoglobin 13.5 - 17.5 g/dL 10.4(A) 12.3(A) 12.1(A)  Hematocrit 41 - 53 % 31(A) 36(A) 35(A)  White count - 5.4 4.9 5.2  Platelet count 150 - 399 K/L 177 160 156  Sodium 137 - 147 mmol/L 139 140 138  Potassium 3.4 - 5.3 mmol/L 4.0 4.1 4.2  Calcium - (None) (None) (None)  Phosphorus - (None) (None) (None)  Creatinine 0.6 - 1.3 mg/dL 1.0 1.2 1.2  AST 14 - 40 U/L 12(A) 17 12(A)  Alk Phos 25 - 125 U/L 64 50 48  Bilirubin - (None) (None) (None)  Glucose - 87  95 90  Cholesterol 0 - 200 mg/dL (None) (None) 166  HDL cholesterol 35 - 70 mg/dL (None) (None) 45  Triglycerides 40 - 160 mg/dL (None) (None) 54  LDL Direct - (None) (None) (None)  LDL Calc - (None) (None) 110  Total protein - (None) (None) (None)  Albumin - (None) (None) (None)   Lab Results  Component Value Date   TSH 2.56 09/12/2015   Lab Results  Component Value Date   BUN 16 09/12/2015   BUN 17 03/07/2015   BUN 15 05/24/2014   Lab Results  Component Value Date   CREATININE 1.0 09/12/2015   CREATININE 1.2 03/07/2015   CREATININE 1.2 05/24/2014   No results found for: HGBA1C     Assessment/Plan  1. Parkinsonian features Patient does not want start additional Parkinson medication at this time  2. Edema, unspecified type Slightly increased over previous visits. Likely aggravation with gabapentin. Discontinue gabapentin and furosemide  3. Essential hypertension Controlled  4. Pain in left hip Improved  5. Slow transit constipation Using laxatives when necessary  6. Corn Sharply debrided in the course of the visit. Hemorrhagic area was removed. There was a punctate 1 mm ulceration under the corn. This area was quite tender.  7. Peripheral polyneuropathy ([place]) Patient is not  benefiting adequately from the gabapentin so he would like it discontinued.  8. Weak Improving, but still dependent upon staff and sitter  9. Pure hypercholesterolemia Recheck in the future

## 2015-10-07 ENCOUNTER — Telehealth: Payer: Self-pay | Admitting: *Deleted

## 2015-10-07 DIAGNOSIS — R259 Unspecified abnormal involuntary movements: Secondary | ICD-10-CM

## 2015-10-07 NOTE — Telephone Encounter (Signed)
Patient son, [name] called and requested referral for patient to [facility]. Please Advise.

## 2015-10-10 NOTE — Telephone Encounter (Signed)
Arrange referral to [facility].

## 2015-10-10 NOTE — Telephone Encounter (Signed)
Order placed and son notified.  

## 2015-10-19 ENCOUNTER — Ambulatory Visit (INDEPENDENT_AMBULATORY_CARE_PROVIDER_SITE_OTHER): Payer: Medicare Other | Admitting: Neurology

## 2015-10-19 ENCOUNTER — Encounter: Payer: Self-pay | Admitting: Neurology

## 2015-10-19 VITALS — BP 124/66 | HR 72 | Ht 70.0 in | Wt 132.0 lb

## 2015-10-19 DIAGNOSIS — G20 Parkinson's disease: Secondary | ICD-10-CM | POA: Diagnosis not present

## 2015-10-19 DIAGNOSIS — G20A1 Parkinson's disease without dyskinesia, without mention of fluctuations: Secondary | ICD-10-CM

## 2015-10-19 DIAGNOSIS — R269 Unspecified abnormalities of gait and mobility: Secondary | ICD-10-CM | POA: Diagnosis not present

## 2015-10-19 DIAGNOSIS — G629 Polyneuropathy, unspecified: Secondary | ICD-10-CM

## 2015-10-19 DIAGNOSIS — E538 Deficiency of other specified B group vitamins: Secondary | ICD-10-CM | POA: Diagnosis not present

## 2015-10-19 HISTORY — DX: Unspecified abnormalities of gait and mobility: R26.9

## 2015-10-19 HISTORY — DX: Parkinson's disease: G20

## 2015-10-19 HISTORY — DX: Parkinson's disease without dyskinesia, without mention of fluctuations: G20.A1

## 2015-10-19 NOTE — Patient Instructions (Signed)
Parkinson Disease Parkinson disease is a disorder of the central nervous system, which includes the brain and spinal cord. A person with this disease slowly loses the ability to completely control body movements. Within the brain, there is a group of nerve cells (basal ganglia) that help control movement. The basal ganglia are damaged and do not work properly in a person with Parkinson disease. In addition, the basal ganglia produce and use a brain chemical called dopamine. The dopamine chemical sends messages to other parts of the body to control and coordinate body movements. Dopamine levels are low in a person with Parkinson disease. If the dopamine levels are low, then the body does not receive the correct messages it needs to move normally.  CAUSES  The exact reason why the basal ganglia get damaged is not known. Some medical researchers have thought that infection, genes, environment, and certain medicines may contribute to the cause.  SYMPTOMS   An early symptom of Parkinson disease is often an uncontrolled shaking (tremor) of the hands. The tremor will often disappear when the affected hand is consciously used.  As the disease progresses, walking, talking, getting out of a chair, and new movements become more difficult.  Muscles get stiff and movements become slower.  Balance and coordination become harder.  Depression, trouble swallowing, urinary problems, constipation, and sleep problems can occur.  Later in the disease, memory and thought processes may deteriorate. DIAGNOSIS  There are no specific tests to diagnose Parkinson disease. You may be referred to a neurologist for evaluation. Your caregiver will ask about your medical history, symptoms, and perform a physical exam. Blood tests and imaging tests of your brain may be performed to rule out other diseases. The imaging tests may include an MRI or a CT scan. TREATMENT  The goal of treatment is to relieve symptoms. Medicines may be  prescribed once the symptoms become troublesome. Medicine will not stop the progression of the disease, but medicine can make movement and balance better and help control tremors. Speech and occupational therapy may also be prescribed. Sometimes, surgical treatment of the brain can be done in young people. HOME CARE INSTRUCTIONS  Get regular exercise and rest periods during the day to help prevent exhaustion and depression.  If getting dressed becomes difficult, replace buttons and zippers with Velcro and elastic on your clothing.  Take all medicine as directed by your caregiver.  Install grab bars or railings in your home to prevent falls.  Go to speech or occupational therapy as directed.  Keep all follow-up visits as directed by your caregiver. SEEK MEDICAL CARE IF:  Your symptoms are not controlled with your medicine.  You fall.  You have trouble swallowing or choke on your food. MAKE SURE YOU:  Understand these instructions.  Will watch your condition.  Will get help right away if you are not doing well or get worse.   This information is not intended to replace advice given to you by your health care provider. Make sure you discuss any questions you have with your health care provider.   Document Released: 08/03/2000 Document Revised: 12/01/2012 Document Reviewed: 09/05/2011 Elsevier Interactive Patient Education 2016 [employer]  

## 2015-10-19 NOTE — Progress Notes (Signed)
Reason for visit: Parkinson's disease  Referring physician: Dr.[name] is a 82 y.o. male  History of present illness:  [name] is an 82 year old left-handed white male with a history of a peripheral neuropathy that has been present for least 20 years. The patient has had some mild gait instability with the peripheral neuropathy, he has been using a cane for ambulation. He had nerve conduction studies done on 12/06/2011 that documented the fairly severe peripheral neuropathy. The patient however, has had a significant change in his ability to walk and function over the last 3 months. The patient has gone from using a cane to using a walker and a wheelchair for mobility. The patient did fall backwards about 2 or 3 months ago and struck the back of his head. The patient believes that he may have had some issues with walking prior to this. He has had difficulty getting up out of a chair, he has problems with shuffling his feet, he is drooling, his speech has become decreased in amplitude, somewhat slurred. He has difficulty swallowing pills, not food or fluids. He denies any tremors of the upper extremities. He has had alteration in his handwriting which has become small. He is stooped with walking, he has troubles with freezing at times with ambulation. Currently, he is sleeping fairly well, he denies any memory problems. He has had a weight loss issue from 152 to about 130 pounds over the last 6 months. He has had significant issues with constipation.  Past Medical History  Diagnosis Date  . Mononeuritis of unspecified site   . Unspecified constipation   . Unspecified hemorrhoids without mention of complication   . Benign neoplasm of colon     pre-cancerous polyps removed at colonoscopy 2008  . Pure hypercholesterolemia   . Unspecified vitamin D deficiency   . Unspecified gastritis and gastroduodenitis without mention of hemorrhage 08/29/2006  . Other pulmonary embolism and  infarction     following cholecystectomy 2003  . Anemia, unspecified   . Hypertrophy of prostate without urinary obstruction and other lower urinary tract symptoms (LUTS)   . Other B-complex deficiencies   . 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 Rosacea   . Osteoarthrosis, unspecified whether generalized or localized, pelvic region and thigh   . Diverticulosis of colon (without mention of hemorrhage)   . Contact dermatitis and other eczema, due to unspecified cause   . Esophageal reflux   . Unspecified essential hypertension   . Peripheral neuropathy ([place])   . Abnormality of gait 10/19/2015  . Parkinson disease ([place]) 10/19/2015    Past Surgical History  Procedure Laterality Date  . Hemorrhoid surgery  1953  . Cholecystectomy, laparoscopic  2003  . Cataract extraction w/ intraocular lens implant Left 5/20//2013  . Colonic polyp removal  1965  . Colonoscopy  08/29/2006    hemorrhoids, 3 small polyps (hyperplastic) & diverticulosis    Family History  Problem Relation Age of Onset  . Cancer Mother     breast  . Pneumonia Father   . Cancer Brother     Social history:  reports that he quit smoking about 52 years ago. His smoking use included Cigarettes. He quit after 15 years of use. He has never used smokeless tobacco. He reports that he drinks about 1.2 oz of alcohol per week. He reports that he does not use illicit drugs.  Medications:  Prior to Admission medications   Medication Sig Start Date End Date  Taking? Authorizing Provider  acetaminophen (TYLENOL) 325 MG tablet Take 650 mg by mouth. Take one tablet as needed   Yes Historical Provider, MD  [facility] 10 GM/15ML SOLN  09/03/15  Yes Historical Provider, MD  Multiple Vitamin (MULTIVITAMIN) tablet Take 1 tablet by mouth daily.   Yes Historical Provider, MD  omeprazole (PRILOSEC) 20 MG capsule Take 20 mg by mouth daily.   Yes Historical Provider, MD  senna (SENOKOT) 8.6 MG tablet Take 1 tablet by mouth  daily.   Yes Historical Provider, MD  vitamin B-12 (CYANOCOBALAMIN) 1000 MCG tablet Take 1,000 mcg by mouth daily.   Yes Historical Provider, MD  Lactulose 20 GM/30ML SOLN Take 2 ounces twice daily as needed for laxative. May mix in water or juice. 09/27/15   Estill Dooms, MD      Allergies  Allergen Reactions  . Lyrica [Pregabalin]     *Anticonvulsants*, fatique    ROS:  Out of a complete 14 system review of symptoms, the patient complains only of the following symptoms, and all other reviewed systems are negative.  Weight loss, fatigue Swelling in the legs Diarrhea, constipation Numbness, weakness, slurred speech, difficulty swallowing Depression, anxiety, not enough sleep, decreased energy, disinterest in activities Restless legs  Blood pressure 124/66, pulse 72, height 5\' 10"  (1.778 m), weight 132 lb (59.875 kg).  Physical Exam  General: The patient is alert and cooperative at the time of the examination.  Eyes: Pupils are equal, round, and reactive to light. Discs are flat bilaterally.  Neck: The neck is supple, no carotid bruits are noted.  Respiratory: The respiratory examination is clear.  Cardiovascular: The cardiovascular examination reveals a regular rate and rhythm, no obvious murmurs or rubs are noted.  Skin: Extremities are without significant edema.  Neurologic Exam  Mental status: The patient is alert and oriented x 3 at the time of the examination. The patient has apparent normal recent and remote memory, with an apparently normal attention span and concentration ability.  Cranial nerves: Facial symmetry is present. There is good sensation of the face to pinprick and soft touch bilaterally. The strength of the facial muscles and the muscles to head turning and shoulder shrug are normal bilaterally. Speech is well enunciated, no aphasia or dysarthria is noted. Extraocular movements are full. Visual fields are full. The tongue is midline, and the patient has  symmetric elevation of the soft palate. No obvious hearing deficits are noted.  Motor: The motor testing reveals 5 over 5 strength of all 4 extremities. Good symmetric motor tone is noted throughout.  Sensory: Sensory testing is intact to pinprick, soft touch, vibration sensation, and position sense on all 4 extremities. No evidence of extinction is noted.  Coordination: Cerebellar testing reveals good finger-nose-finger and heel-to-shin bilaterally.  Gait and station: Gait is normal. Tandem gait is normal. Romberg is negative. No drift is seen.  Reflexes: Deep tendon reflexes are symmetric and normal bilaterally. Toes are downgoing bilaterally.   Assessment/Plan:  1. Parkinson's disease  2. Peripheral neuropathy  3. Gait disturbance  3. Constipation  The patient has had a significant decline in his mobility over the last several months. The patient has clear features of Parkinson's disease with masked face, drooling, decreased arm swing, shuffling gait, and decreased superior gaze. The patient will be started on low-dose Sinemet taking the 25/100 mg one half tablet 3 times daily. He will follow-up in 3 months. He is having a lot of problems with constipation, he is to go on MiraLAX  daily, stop the Senokot which causes diarrhea. The patient will be set up for blood work today, he will have MRI evaluation of the brain. They will contact me if he is not doing well with the medication. The patient will be set up for physical therapy.  Jill Alexanders MD 10/19/2015 7:16 PM  [facility] [address] [place],  [address]  Phone [phone] Fax [phone]

## 2015-10-21 ENCOUNTER — Ambulatory Visit
Admission: RE | Admit: 2015-10-21 | Discharge: 2015-10-21 | Disposition: A | Discharge status: Home / Self Care | Payer: Medicare Other | Source: Ambulatory Visit | Attending: Neurology | Admitting: Neurology

## 2015-10-21 ENCOUNTER — Telehealth: Payer: Self-pay | Admitting: *Deleted

## 2015-10-21 DIAGNOSIS — G20 Parkinson's disease: Secondary | ICD-10-CM | POA: Diagnosis not present

## 2015-10-21 DIAGNOSIS — R269 Unspecified abnormalities of gait and mobility: Secondary | ICD-10-CM | POA: Diagnosis not present

## 2015-10-21 DIAGNOSIS — G629 Polyneuropathy, unspecified: Secondary | ICD-10-CM

## 2015-10-21 LAB — VITAMIN B12: VITAMIN B 12: 814 pg/mL (ref 211–946)

## 2015-10-21 LAB — COPPER, SERUM: Copper: 100 ug/dL (ref 72–166)

## 2015-10-21 LAB — RPR: RPR: NONREACTIVE

## 2015-10-21 NOTE — Telephone Encounter (Signed)
Spoke with patient's son, [name] and informed him, per Dr Jannifer Franklin, his father's lab results are unremarkable. He then stated patient had MRI this morning, wanted to know when family will receive results. Informed it it may take 5 business days but to call by this time next week if he has not received call. He verbalized understanding, appreciation.

## 2015-10-23 ENCOUNTER — Telehealth: Payer: Self-pay | Admitting: Neurology

## 2015-10-23 NOTE — Telephone Encounter (Signed)
  I called the patient and I talked with the son. The MRI does not show a lot of SVD. We will continue the sinemet and get PT.  MRI brain 10/23/15:  IMPRESSION: This MRI of the brain without contrast shows the following: 1. Mild to moderate cortical atrophy 2. Mild extent of T2/FLAIR hyperintense foci mostly in the parietal periventricular and deep white matter. This extent of chronic microvascular ischemic changes is common for age.  3. There are no acute findings.

## 2015-10-25 ENCOUNTER — Non-Acute Institutional Stay: Payer: Medicare Other | Admitting: Internal Medicine

## 2015-10-25 ENCOUNTER — Encounter: Payer: Self-pay | Admitting: Internal Medicine

## 2015-10-25 VITALS — BP 132/62 | HR 71 | Temp 97.3°F | Resp 20 | Ht 70.0 in | Wt 133.6 lb

## 2015-10-25 DIAGNOSIS — M2042 Other hammer toe(s) (acquired), left foot: Secondary | ICD-10-CM

## 2015-10-25 DIAGNOSIS — L84 Corns and callosities: Secondary | ICD-10-CM | POA: Diagnosis not present

## 2015-10-25 DIAGNOSIS — G20 Parkinson's disease: Secondary | ICD-10-CM | POA: Diagnosis not present

## 2015-10-25 DIAGNOSIS — R609 Edema, unspecified: Secondary | ICD-10-CM

## 2015-10-25 DIAGNOSIS — R269 Unspecified abnormalities of gait and mobility: Secondary | ICD-10-CM

## 2015-10-25 DIAGNOSIS — L89151 Pressure ulcer of sacral region, stage 1: Secondary | ICD-10-CM | POA: Diagnosis not present

## 2015-10-25 DIAGNOSIS — G20A1 Parkinson's disease without dyskinesia, without mention of fluctuations: Secondary | ICD-10-CM

## 2015-10-25 DIAGNOSIS — I10 Essential (primary) hypertension: Secondary | ICD-10-CM | POA: Diagnosis not present

## 2015-10-25 NOTE — Progress Notes (Signed)
Patient ID: Lawrence Turner, male   DOB: May 09, 1926, 82 y.o.   MRN: [id number]    [place] Room Number: AL-33  Place of Service: SNF (31)     Allergies  Allergen Reactions  . Lyrica [Pregabalin]     *Anticonvulsants*, fatique    Chief Complaint  Patient presents with  . Acute Visit    ulcer on both buttocks    HPI:  Parkinson's disease [facility]) -  Patient saw Dr. Jannifer Franklin, neurologist. He started him on Sinemet. I tried him on this medicine about 2 years ago, but he said he wasn't tolerating it well with increased dizziness. He is willing to try it again. He just started a few days ago. In the best of all orals, this would help improve his gait, speech, and sense of well-being.  Essential hypertension -Controlled  Corn -Tip of the left third toe. Very tender. Making it difficult to walk.  Edema, unspecified type -Improved  Hammer toe of left foot -Contributes to the formation of the corn of the left third toe.  Abnormality of gait -Using a walker  Decubitus ulcer of sacral region, stage 1 -Previously about stage II. All areas have healed except for some scaling of the skin.    Medications: Patient's Medications  New Prescriptions   No medications on file  Previous Medications   ACETAMINOPHEN (TYLENOL) 325 MG TABLET    Take 650 mg by mouth. Reported on 10/25/2015   CARBIDOPA-LEVODOPA (SINEMET IR) 25-100 MG TABLET    Take 0.5 tablets by mouth 3 (three) times daily.   GENERLAC 10 GM/15ML SOLN       LACTOSE FREE NUTRITION (BOOST) LIQD    Take 237 mLs by mouth 3 (three) times daily between meals.   LACTULOSE 20 GM/30ML SOLN    Take 2 ounces twice daily as needed for laxative. May mix in water or juice.   MULTIPLE VITAMIN (MULTIVITAMIN) TABLET    Take 1 tablet by mouth daily.   OMEPRAZOLE (PRILOSEC) 20 MG CAPSULE    Take 20 mg by mouth daily.   VITAMIN B-12 (CYANOCOBALAMIN) 1000 MCG TABLET    Take 1,000 mcg by mouth daily.  Modified Medications   No medications on file  Discontinued Medications   SENNA (SENOKOT) 8.6 MG TABLET    Take 1 tablet by mouth daily. Reported on 10/25/2015     Review of Systems  Constitutional: Negative for fever, chills and diaphoresis.  HENT: Negative for congestion, ear pain and hearing loss.   Eyes: Negative for photophobia, pain, discharge and redness.  Respiratory: Negative for cough, shortness of breath and wheezing.   Cardiovascular: Positive for leg swelling. Negative for chest pain and palpitations.       BLE edema  Gastrointestinal: Positive for constipation. Negative for nausea, abdominal pain, diarrhea and blood in stool.  Genitourinary: Negative.   Musculoskeletal: Negative for myalgias, back pain and neck pain.  Skin:       Multiple seborrheic keratoses.  Corn on the tip pf the left 3nd toe. Tender to touch..  Neurological: Positive for weakness. Negative for tremors, seizures and headaches.       Complaint of loss of motor skills and changes in. He is unstable with walking. He has a known peripheral neuropathy of uncertain origin. Micrographia. Pressured, soft speech that is nearly inaudible.  Hematological:       History of anemia  Psychiatric/Behavioral: The patient is nervous/anxious.     Filed Vitals:   10/25/15 0913  BP: 132/62  Pulse: 71  Temp: 97.3 F (36.3 C)  TempSrc: Oral  Resp: 20  Height: 5' 10"  (9.470 m)  Weight: 133 lb 9.6 oz (60.601 kg)  SpO2: 98%   Wt Readings from Last 3 Encounters:  10/25/15 133 lb 9.6 oz (60.601 kg)  10/19/15 132 lb (59.875 kg)  09/27/15 139 lb 9.6 oz (63.322 kg)    Body mass index is 19.17 kg/(m^2).  Physical Exam  Constitutional: He is oriented to person, place, and time.  Thin. Elderly.  HENT:  Right Ear: External ear normal.  Left Ear: External ear normal.  Nose: Nose normal.  Mouth/Throat: Oropharynx is clear and moist. No oropharyngeal exudate.  Eyes: Conjunctivae are normal. Pupils are equal, round, and reactive to light.    Neck: No JVD present. No tracheal deviation present. No thyromegaly present.  Cardiovascular: Normal rate, regular rhythm, normal heart sounds and intact distal pulses.  Exam reveals no gallop and no friction rub.   No murmur heard. Pulmonary/Chest: No respiratory distress. He has no wheezes. He has no rales. He exhibits no tenderness.  Abdominal: He exhibits no distension and no mass. There is no tenderness.  Musculoskeletal: Normal range of motion. He exhibits edema (1+ ipedal). He exhibits no tenderness.  unstable gait. 2+BLE edema.   Lymphadenopathy:    He has no cervical adenopathy.  Neurological: He is alert and oriented to person, place, and time. He displays abnormal reflex (Absent at both knees). No cranial nerve deficit. Coordination normal.  Cogwheeling most evident in the right wrist. Gait is mildly abnormal with a suggestion of a festinating gait. Speech is soft and mildly pressured. There is a mild tremor at rest. There is no focal weakness. Fails to extinguish glabellar reflex. 1/14//15 MMSE 30/30. Passed clock drawing test.  Skin: No rash noted. No erythema. No pallor.  Corn at the tip of the left s third toe that is quite tender.  Psychiatric:  Patient appears mildly anxious.      Labs reviewed: Lab Summary Latest Ref Rng 09/12/2015 03/07/2015 05/24/2014  Hemoglobin 13.5 - 17.5 g/dL 10.4(A) 12.3(A) 12.1(A)  Hematocrit 41 - 53 % 31(A) 36(A) 35(A)  White count - 5.4 4.9 5.2  Platelet count 150 - 399 K/L 177 160 156  Sodium 137 - 147 mmol/L 139 140 138  Potassium 3.4 - 5.3 mmol/L 4.0 4.1 4.2  Calcium - (None) (None) (None)  Phosphorus - (None) (None) (None)  Creatinine 0.6 - 1.3 mg/dL 1.0 1.2 1.2  AST 14 - 40 U/L 12(A) 17 12(A)  Alk Phos 25 - 125 U/L 64 50 48  Bilirubin - (None) (None) (None)  Glucose - 87 95 90  Cholesterol 0 - 200 mg/dL (None) (None) 166  HDL cholesterol 35 - 70 mg/dL (None) (None) 45  Triglycerides 40 - 160 mg/dL (None) (None) 54  LDL Direct -  (None) (None) (None)  LDL Calc - (None) (None) 110  Total protein - (None) (None) (None)  Albumin - (None) (None) (None)   Lab Results  Component Value Date   TSH 2.56 09/12/2015   Lab Results  Component Value Date   BUN 16 09/12/2015   BUN 17 03/07/2015   BUN 15 05/24/2014   Lab Results  Component Value Date   CREATININE 1.0 09/12/2015   CREATININE 1.2 03/07/2015   CREATININE 1.2 05/24/2014   No results found for: HGBA1C     Assessment/Plan  1. Parkinson's disease ([place]) Continue Sinemet  2. Essential hypertension Controlled  3. Corn Sharply  debrided. Removal of the corn seemed to help the pain by the time he left.  4. Edema, unspecified type Improved  5. Hammer toe of left foot Contributes to the development of a recurrent corn  6. Abnormality of gait Continue use of walker  7. Decubitus ulcer of sacral region, stage 1 Obtain pressure relieving cushion such as a Water engineer

## 2015-10-27 DIAGNOSIS — D649 Anemia, unspecified: Secondary | ICD-10-CM | POA: Diagnosis not present

## 2015-10-27 LAB — CBC AND DIFFERENTIAL
HCT: 31 % — AB (ref 41–53)
Hemoglobin: 10.5 g/dL — AB (ref 13.5–17.5)
Platelets: 156 10*3/uL (ref 150–399)
WBC: 4.5 10^3/mL

## 2015-11-01 ENCOUNTER — Encounter: Payer: Self-pay | Admitting: Internal Medicine

## 2015-11-23 ENCOUNTER — Telehealth: Payer: Self-pay | Admitting: Neurology

## 2015-11-23 MED ORDER — CARBIDOPA-LEVODOPA 25-100 MG PO TABS: 1.0000 | ORAL_TABLET | Freq: Three times a day (TID) | ORAL | Status: DC

## 2015-11-23 NOTE — Telephone Encounter (Signed)
Patient's son is calling regarding the patient.He states the patient is taking carbidopa-levodopa (SINEMET IR) 25-100 MG tablet and he thinks he needs a higher dosage because he is having balance issues and his motion has not improved. Please call and discuss. The patient resides at [facility].

## 2015-11-23 NOTE — Telephone Encounter (Signed)
I called patient. He was started on Sinemet in low dose taking one half tablet 3 times daily of the Sinemet 25/100 mg tablet. He has not had any significant improvement in mobility and balance. We will go up to a full tablet 3 times a day.

## 2015-12-01 NOTE — Telephone Encounter (Signed)
I called [facility] at [phone], I gave verbal orders to change his Sinemet dose to the 25/100 mg tablets, take 1 tablet 3 times daily.

## 2015-12-01 NOTE — Telephone Encounter (Signed)
Patient's son is calling. He states a new Rx for carbidopa-levodopa (SINEMET IR) 25-100 MG tablet needs to be called to Dr. Jeanmarie Hubert at [phone] with the new increased dosage of carbidopa-levodopa (SINEMET IR) 25-100 MG tablet . The new Rx was sent to [facility] but the patient's son says it needs to go to Dr. Nyoka Cowden who takes care of the patient at [facility]. The patient's son also would like Dr. Jannifer Franklin to call the patient at [phone] to discuss a balance issue.

## 2015-12-08 ENCOUNTER — Telehealth: Payer: Self-pay | Admitting: Neurology

## 2015-12-08 NOTE — Telephone Encounter (Signed)
Physicians order mailed to [facility] for the patient.

## 2015-12-26 ENCOUNTER — Telehealth: Payer: Self-pay | Admitting: Neurology

## 2015-12-26 MED ORDER — CARBIDOPA-LEVODOPA 25-100 MG PO TABS: 1.5000 | ORAL_TABLET | Freq: Three times a day (TID) | ORAL | Status: DC

## 2015-12-26 NOTE — Telephone Encounter (Signed)
I called patient. The patient has gotten on the Sinemet taking the 25/100 mg tablet 3 times daily. He has not noted any real change in his ability to walk, he requires a walker for ambulation. He has a severe peripheral neuropathy which is also impacting his ability to walk and may impact his balance. We will try going up on the Sinemet to 1.5 tablets 3 times daily. The patient lives at [place].   I called the nurse and gave a verbal order for the Sinemet to be increased. Telephone number [phone].

## 2015-12-26 NOTE — Telephone Encounter (Signed)
Son Ozzie Hoyle called regarding carbidopa-levodopa (SINEMET IR) 25-100 MG tablet and need to increase dosage. Please call patient [phone].

## 2015-12-26 NOTE — Telephone Encounter (Signed)
Spoke to pt's son Ozzie Hoyle. Says that his dad has not been as mobile recently. Says that Sinemet did interfere w/ balance a little bit when first starting medication but did not notice this when increasing medication. Currently has order for 25-100 mg TID. Would like for Dr. Jannifer Franklin to talk to his dad and possibly call in increase of Sinemet to Dr. Nyoka Cowden at [facility].

## 2015-12-29 NOTE — Telephone Encounter (Signed)
45 Fax Order signed and faxed back to [facility] (F # [phone]) for Sinemet 25-100 mg - take 1.5 tab PO TID.

## 2016-01-24 ENCOUNTER — Encounter: Payer: Self-pay | Admitting: Internal Medicine

## 2016-01-24 ENCOUNTER — Non-Acute Institutional Stay: Payer: Medicare Other | Admitting: Internal Medicine

## 2016-01-24 ENCOUNTER — Other Ambulatory Visit: Payer: Self-pay | Admitting: Internal Medicine

## 2016-01-24 VITALS — BP 100/54 | HR 52 | Temp 97.7°F | Ht 70.0 in | Wt 127.0 lb

## 2016-01-24 DIAGNOSIS — G20 Parkinson's disease: Secondary | ICD-10-CM

## 2016-01-24 DIAGNOSIS — D649 Anemia, unspecified: Secondary | ICD-10-CM

## 2016-01-24 DIAGNOSIS — R109 Unspecified abdominal pain: Secondary | ICD-10-CM | POA: Insufficient documentation

## 2016-01-24 DIAGNOSIS — R634 Abnormal weight loss: Secondary | ICD-10-CM | POA: Insufficient documentation

## 2016-01-24 DIAGNOSIS — I10 Essential (primary) hypertension: Secondary | ICD-10-CM | POA: Diagnosis not present

## 2016-01-24 DIAGNOSIS — R1030 Lower abdominal pain, unspecified: Secondary | ICD-10-CM | POA: Diagnosis not present

## 2016-01-24 DIAGNOSIS — R609 Edema, unspecified: Secondary | ICD-10-CM | POA: Diagnosis not present

## 2016-01-24 DIAGNOSIS — K5900 Constipation, unspecified: Secondary | ICD-10-CM

## 2016-01-24 DIAGNOSIS — R197 Diarrhea, unspecified: Secondary | ICD-10-CM

## 2016-01-24 NOTE — Progress Notes (Signed)
Patient ID: Lawrence Turner, male   DOB: 16-May-1926, 82 y.o.   MRN: [id number]    [place] Room Number: [id number]  Place of Service: Clinic (12)     Allergies  Allergen Reactions  . Lyrica [Pregabalin]     *Anticonvulsants*, fatique    Chief Complaint  Patient presents with  . Medical Management of Chronic Issues    3 month medication management Parkinson's, blood pressure. Here with son Ozzie Hoyle  . Diarrhea    for some time  . Weight Loss    and loss of appetitic, pain in lower abdomin. Weight 08/11/15 was 158lb. Son would like to bring him home with him to see if this would help in weigh gain.    HPI:  Weight loss - Patient has lost about 30 pounds since coming to assisted living. He has a very poor appetite. He is drinking boost, but there is some suspicion this is creating problems with loose stools.   Anemia, unspecified anemia type - chronic anemia, due for lab recheck  Parkinson's disease ([place]) - tolerating Sinemet. There is mild improvement in his tremor  Essential hypertension - now running on the low side  Edema, unspecified type - improved  Diarrhea, unspecified type - patient describes 4-5 loose stools daily. He is getting a small dose of MiraLAX periodically. Previously had problems with constipation. There is suspicion the Boost he is taking for caloric supplementation is creating some of the problems with loose stools.  Lower abdominal pain - diffuse lower abdominal discomfort. Attempt was made to get a barium enema 7 2016, but he was unable to retain the contrast material. The rectal ampulla and a portion of the sigmoid were felt to be normal but the rest of the colon was not visualized.    Medications: Patient's Medications  New Prescriptions   No medications on file  Previous Medications   ACETAMINOPHEN (TYLENOL) 325 MG TABLET    Take 650 mg by mouth. Reported on 10/25/2015   CARBIDOPA-LEVODOPA (SINEMET IR) 25-100 MG TABLET    Take 1.5  tablets by mouth 3 (three) times daily.   DULOXETINE (CYMBALTA) 30 MG CAPSULE    Take one tablet daily   GENERLAC 10 GM/15ML SOLN       LACTOSE FREE NUTRITION (BOOST) LIQD    Take 237 mLs by mouth 3 (three) times daily between meals.   LACTULOSE 20 GM/30ML SOLN    Take 2 ounces twice daily as needed for laxative. May mix in water or juice.   MULTIPLE VITAMIN (MULTIVITAMIN) TABLET    Take 1 tablet by mouth daily.   OMEPRAZOLE (PRILOSEC) 20 MG CAPSULE    Take 20 mg by mouth daily.   POLYETHYLENE GLYCOL POWDER (GLYCOLAX/MIRALAX) POWDER    Take once daily   VITAMIN B-12 (CYANOCOBALAMIN) 1000 MCG TABLET    Take 1,000 mcg by mouth daily.  Modified Medications   No medications on file  Discontinued Medications   No medications on file     Review of Systems  Constitutional: Positive for unexpected weight change (losiing weight). Negative for fever, chills and diaphoresis.  HENT: Negative for congestion, ear pain and hearing loss.   Eyes: Negative for photophobia, pain, discharge and redness.  Respiratory: Negative for cough, shortness of breath and wheezing.   Cardiovascular: Positive for leg swelling. Negative for chest pain and palpitations.       BLE edema  Gastrointestinal: Positive for diarrhea. Negative for nausea, abdominal pain, constipation and blood in stool.  Genitourinary: Negative.   Musculoskeletal: Negative for myalgias, back pain and neck pain.  Skin:       Multiple seborrheic keratoses.  Corn on the tip pf the left 3nd toe. Tender to touch..  Neurological: Positive for weakness. Negative for tremors, seizures and headaches.       Complaint of loss of motor skills and changes in. He is unstable with walking. He has a known peripheral neuropathy of uncertain origin. Micrographia. Pressured, soft speech that is nearly inaudible.  Hematological:       History of anemia  Psychiatric/Behavioral: The patient is nervous/anxious.     Filed Vitals:   01/24/16 1127  BP: 100/54    Pulse: 52  Temp: 97.7 F (36.5 C)  TempSrc: Oral  Height: 5' 10"  (1.778 m)  Weight: 127 lb (57.607 kg)  SpO2: 95%   Wt Readings from Last 3 Encounters:  01/24/16 127 lb (57.607 kg)  10/25/15 133 lb 9.6 oz (60.601 kg)  10/19/15 132 lb (59.875 kg)    Body mass index is 18.22 kg/(m^2).  Physical Exam  Constitutional: He is oriented to person, place, and time.  Thin. Elderly. Protein calorie insufficiency with malnourishment.  HENT:  Right Ear: External ear normal.  Left Ear: External ear normal.  Nose: Nose normal.  Mouth/Throat: Oropharynx is clear and moist. No oropharyngeal exudate.  Eyes: Conjunctivae are normal. Pupils are equal, round, and reactive to light.  Neck: No JVD present. No tracheal deviation present. No thyromegaly present.  Cardiovascular: Normal rate, regular rhythm, normal heart sounds and intact distal pulses.  Exam reveals no gallop and no friction rub.   No murmur heard. Pulmonary/Chest: No respiratory distress. He has no wheezes. He has no rales. He exhibits no tenderness.  Abdominal: He exhibits no distension and no mass. There is no tenderness.  Musculoskeletal: Normal range of motion. He exhibits edema (1+ ipedal). He exhibits no tenderness.  unstable gait. 2+BLE edema.   Lymphadenopathy:    He has no cervical adenopathy.  Neurological: He is alert and oriented to person, place, and time. He displays abnormal reflex (Absent at both knees). No cranial nerve deficit. Coordination normal.  Cogwheeling most evident in the right wrist. Gait is mildly abnormal with a suggestion of a festinating gait. Speech is soft and mildly pressured. There is a mild tremor at rest. There is no focal weakness. Fails to extinguish glabellar reflex. 1/14//15 MMSE 30/30. Passed clock drawing test.  Skin: No rash noted. No erythema. No pallor.  Corn at the tip of the left s third toe that is quite tender.  Psychiatric:  Patient appears mildly anxious.      Labs  reviewed: Lab Summary Latest Ref Rng 10/27/2015 09/12/2015 03/07/2015  Hemoglobin 13.5 - 17.5 g/dL 10.5(A) 10.4(A) 12.3(A)  Hematocrit 41 - 53 % 31(A) 31(A) 36(A)  White count - 4.5 5.4 4.9  Platelet count 150 - 399 K/L 156 177 160  Sodium 137 - 147 mmol/L (None) 139 140  Potassium 3.4 - 5.3 mmol/L (None) 4.0 4.1  Calcium - (None) (None) (None)  Phosphorus - (None) (None) (None)  Creatinine 0.6 - 1.3 mg/dL (None) 1.0 1.2  AST 14 - 40 U/L (None) 12(A) 17  Alk Phos 25 - 125 U/L (None) 64 50  Bilirubin - (None) (None) (None)  Glucose - (None) 87 95  Cholesterol - (None) (None) (None)  HDL cholesterol - (None) (None) (None)  Triglycerides - (None) (None) (None)  LDL Direct - (None) (None) (None)  LDL Calc - (None) (  None) (None)  Total protein - (None) (None) (None)  Albumin - (None) (None) (None)   Lab Results  Component Value Date   TSH 2.56 09/12/2015   Lab Results  Component Value Date   BUN 16 09/12/2015   BUN 17 03/07/2015   BUN 15 05/24/2014   Lab Results  Component Value Date   CREATININE 1.0 09/12/2015   CREATININE 1.2 03/07/2015   CREATININE 1.2 05/24/2014   No results found for: HGBA1C     Assessment/Plan  1. Weight loss Poor caloric intake is the most likely cause of this. I worry some about the presence of a hidden malignancy. Schedule CT of the abdomen with oral and IV contrast  2. Anemia, unspecified anemia type -CBC  3. Parkinson's disease (H[facility]) Continue current dose Sinemet  4. Essential hypertension -CMP  5. Edema, unspecified type Improved  6. Diarrhea, unspecified type Schedule CT of the abdomen with oral and IV contrast  7. Lower abdominal pain Schedule CT of the abdomen with oral and IV contrast

## 2016-01-25 ENCOUNTER — Telehealth: Payer: Self-pay | Admitting: *Deleted

## 2016-01-25 NOTE — Telephone Encounter (Signed)
Patient son, Lawrence Turner called and stated that patient was seen yesterday. Son is requesting an Appetite Booster to be called into pharmacy. Stated that he watched his dad eat yesterday and he is not eating much of anything. Please Advise.

## 2016-01-26 DIAGNOSIS — D649 Anemia, unspecified: Secondary | ICD-10-CM | POA: Diagnosis not present

## 2016-01-26 DIAGNOSIS — I10 Essential (primary) hypertension: Secondary | ICD-10-CM | POA: Diagnosis not present

## 2016-01-27 ENCOUNTER — Telehealth: Payer: Self-pay | Admitting: Neurology

## 2016-01-27 MED ORDER — MEGESTROL ACETATE 400 MG/10ML PO SUSP: 400.0000 mg | Freq: Every day | ORAL | Status: DC

## 2016-01-27 NOTE — Telephone Encounter (Signed)
Patient's son [name] is calling. He states the patient has been losing weight. He used to weigh 160 and now weighs 126. [name] wants Dr. Jannifer Franklin to call the patient at [phone] to discuss something he can do to increase his appetite because he does not want to eat. Please call the patient and discuss.

## 2016-01-27 NOTE — Telephone Encounter (Signed)
I called patient, talk with the son and I later talked with the patient. The patient has had ongoing weight loss, the son is concerned it may be some underlying depression and anxiety. The patient indicates that he just does not have an appetite, he is having some frequent diarrhea. He denies these getting nausea from the medication. I will try Megace for the appetite.

## 2016-01-27 NOTE — Addendum Note (Signed)
Addended by: Margette Fast on: 01/27/2016 02:34 PM   Modules accepted: Orders

## 2016-01-27 NOTE — Telephone Encounter (Signed)
Patient's son is called because he still has not heard a response, please advise

## 2016-01-31 ENCOUNTER — Ambulatory Visit
Admission: RE | Admit: 2016-01-31 | Discharge: 2016-01-31 | Disposition: A | Discharge status: Home / Self Care | Payer: Medicare Other | Source: Ambulatory Visit | Attending: Internal Medicine | Admitting: Internal Medicine

## 2016-01-31 DIAGNOSIS — K5900 Constipation, unspecified: Secondary | ICD-10-CM

## 2016-01-31 DIAGNOSIS — R109 Unspecified abdominal pain: Secondary | ICD-10-CM

## 2016-01-31 DIAGNOSIS — K5730 Diverticulosis of large intestine without perforation or abscess without bleeding: Secondary | ICD-10-CM | POA: Diagnosis not present

## 2016-01-31 DIAGNOSIS — R634 Abnormal weight loss: Secondary | ICD-10-CM

## 2016-01-31 MED ORDER — IOPAMIDOL (ISOVUE-300) INJECTION 61%
100.0000 mL | Freq: Once | INTRAVENOUS | Status: AC | PRN
  Administered 2016-01-31: 100 mL via INTRAVENOUS

## 2016-02-01 ENCOUNTER — Other Ambulatory Visit: Payer: Self-pay | Admitting: Internal Medicine

## 2016-02-01 DIAGNOSIS — R64 Cachexia: Secondary | ICD-10-CM

## 2016-02-01 MED ORDER — MEGESTROL ACETATE 625 MG/5ML PO SUSP: ORAL | Status: DC

## 2016-02-01 NOTE — Telephone Encounter (Signed)
I added Megestrol ES 5 mL daily to his medications.

## 2016-02-14 ENCOUNTER — Non-Acute Institutional Stay: Payer: Medicare Other | Admitting: Internal Medicine

## 2016-02-14 ENCOUNTER — Encounter: Payer: Self-pay | Admitting: Internal Medicine

## 2016-02-14 VITALS — BP 112/60 | HR 61 | Temp 97.9°F | Ht 70.0 in | Wt 133.0 lb

## 2016-02-14 DIAGNOSIS — G629 Polyneuropathy, unspecified: Secondary | ICD-10-CM

## 2016-02-14 DIAGNOSIS — G20 Parkinson's disease: Secondary | ICD-10-CM | POA: Diagnosis not present

## 2016-02-14 DIAGNOSIS — R609 Edema, unspecified: Secondary | ICD-10-CM

## 2016-02-14 DIAGNOSIS — R634 Abnormal weight loss: Secondary | ICD-10-CM | POA: Diagnosis not present

## 2016-02-14 DIAGNOSIS — R531 Weakness: Secondary | ICD-10-CM

## 2016-02-14 DIAGNOSIS — I10 Essential (primary) hypertension: Secondary | ICD-10-CM | POA: Diagnosis not present

## 2016-02-14 DIAGNOSIS — R197 Diarrhea, unspecified: Secondary | ICD-10-CM | POA: Diagnosis not present

## 2016-02-14 DIAGNOSIS — K5901 Slow transit constipation: Secondary | ICD-10-CM | POA: Diagnosis not present

## 2016-02-14 NOTE — Telephone Encounter (Signed)
Son has picked up from pharmacy.

## 2016-02-14 NOTE — Progress Notes (Signed)
Patient ID: Lawrence Turner, male   DOB: 1926-08-08, 82 y.o.   MRN: [id number]    [place] Room Number: 52  Place of Service: Clinic (12)     Allergies  Allergen Reactions  . Lyrica [Pregabalin]     *Anticonvulsants*, fatique    Chief Complaint  Patient presents with  . Medical Management of Chronic Issues    3 week follow up on weight loss, anemia, diarrhea, appetite, review CT abdomen with son [place]. Patient is at home with son at this time.    HPI:  Patient has been living with his son. He was also started on Megace. He has regained some weight. Appetite is improved.  CT scan of the abdomen 01/31/16 did not show evidence of malignancy. There are multiple irregularities. See full report below.  IMPRESSION:  1. Irregular thickening of the walls of the lower rectum, possibly benign wall hypertrophy due to chronic constipation. Currently, there is a moderate amount of stool within the overlying rectal vault and a moderate amount of stool and gas throughout the remainder of the nondistended colon, compatible with the given history of constipation. Neoplastic wall thickening cannot be confidently excluded. Consider direct endoscopic evaluation.  2. Walls of the sigmoid colon may also be somewhat thickened throughout but are difficult to definitively characterize due to inadequate distention. This could also be sequela of a chronic constipation or possibly related to previous episodes of diverticulitis.  3. Colonic diverticulosis without evidence of acute diverticulitis.  4. Significant enlargement of the prostate gland causing mass effect on the bladder base. Bladder otherwise unremarkable.  5. Prominent portal vein measuring 2 cm diameter, suggesting some degree of portal venous hypertension. No obvious signs of liver cirrhosis seen.  6. Cystic-appearing mass posterior to the left kidney, measuring 5.9 x 2.8 x 6.9 cm, probably benign exophytic renal cyst. This is not a  suspicious-appearing mass. At most, would consider follow-up renal protocol CT in 3-6 months to ensure stability.  7. Extensive degenerative change throughout the spine and at the bilateral hips. No acute-appearing osseous abnormality.  Patient is now off Boost and other liquid supplemental shakes. Stools have improved a little bit. No diarrhea.  The patient has a functional bowel disorder dominated by constipation but with episodes of diarrhea.  Parkinsonism seems to be doing better on current dose of Sinemet.  Medications: Patient's Medications  New Prescriptions   No medications on file  Previous Medications   ACETAMINOPHEN (TYLENOL) 325 MG TABLET    Take 650 mg by mouth. Reported on 10/25/2015   CARBIDOPA-LEVODOPA (SINEMET IR) 25-100 MG TABLET    Take 1.5 tablets by mouth 3 (three) times daily.   GENERLAC 10 GM/15ML SOLN       LACTOSE FREE NUTRITION (BOOST) LIQD    Take 237 mLs by mouth 3 (three) times daily between meals.   LACTULOSE 20 GM/30ML SOLN    Take 2 ounces twice daily as needed for laxative. May mix in water or juice.   MEGESTROL (MEGACE ES) 625 MG/5ML SUSPENSION    Take 5 cc by mouth daily to help stimulate appetite   MULTIPLE VITAMIN (MULTIVITAMIN) TABLET    Take 1 tablet by mouth daily.   PAROXETINE (PAXIL) 10 MG TABLET    Take one tablet daily   POLYETHYLENE GLYCOL POWDER (GLYCOLAX/MIRALAX) POWDER    Take once daily   RANITIDINE (ZANTAC) 150 MG TABLET    Take one at bedtime   VITAMIN B-12 (CYANOCOBALAMIN) 1000 MCG TABLET  Take 1,000 mcg by mouth daily.  Modified Medications   No medications on file  Discontinued Medications   DULOXETINE (CYMBALTA) 30 MG CAPSULE    Take one tablet daily   MEGESTROL (MEGACE) 400 MG/10ML SUSPENSION    Take 10 mLs (400 mg total) by mouth daily.   OMEPRAZOLE (PRILOSEC) 20 MG CAPSULE    Take 20 mg by mouth daily.     Review of Systems  Constitutional: Positive for unexpected weight change (losiing weight). Negative for fever, chills  and diaphoresis.  HENT: Negative for congestion, ear pain and hearing loss.   Eyes: Negative for photophobia, pain, discharge and redness.  Respiratory: Negative for cough, shortness of breath and wheezing.   Cardiovascular: Positive for leg swelling. Negative for chest pain and palpitations.       BLE edema  Gastrointestinal: Positive for diarrhea and constipation. Negative for nausea, abdominal pain and blood in stool.  Genitourinary: Negative.   Musculoskeletal: Negative for myalgias, back pain and neck pain.  Skin:       Multiple seborrheic keratoses.  Corn on the tip pf the left 3nd toe. Tender to touch..  Neurological: Positive for weakness. Negative for tremors, seizures and headaches.       Complaint of loss of motor skills and changes in. He is unstable with walking. He has a known peripheral neuropathy of uncertain origin. Micrographia. Pressured, soft speech that is nearly inaudible. History of parkinsonism currently treated with Sinemet.  Hematological:       History of anemia  Psychiatric/Behavioral: The patient is nervous/anxious.     Filed Vitals:   02/14/16 0856  BP: 112/60  Pulse: 61  Temp: 97.9 F (36.6 C)  Height: 5' 10"  (1.778 m)  Weight: 133 lb (60.328 kg)  SpO2: 99%   Wt Readings from Last 3 Encounters:  02/14/16 133 lb (60.328 kg)  01/24/16 127 lb (57.607 kg)  10/25/15 133 lb 9.6 oz (60.601 kg)    Body mass index is 19.08 kg/(m^2).  Physical Exam  Constitutional: He is oriented to person, place, and time.  Thin. Elderly. Protein calorie insufficiency with malnourishment.  HENT:  Right Ear: External ear normal.  Left Ear: External ear normal.  Nose: Nose normal.  Mouth/Throat: Oropharynx is clear and moist. No oropharyngeal exudate.  Eyes: Conjunctivae are normal. Pupils are equal, round, and reactive to light.  Neck: No JVD present. No tracheal deviation present. No thyromegaly present.  Cardiovascular: Normal rate, regular rhythm, normal heart  sounds and intact distal pulses.  Exam reveals no gallop and no friction rub.   No murmur heard. Pulmonary/Chest: No respiratory distress. He has no wheezes. He has no rales. He exhibits no tenderness.  Abdominal: He exhibits no distension and no mass. There is no tenderness.  Musculoskeletal: Normal range of motion. He exhibits edema (1+ ipedal). He exhibits no tenderness.  unstable gait. 2+BLE edema.   Lymphadenopathy:    He has no cervical adenopathy.  Neurological: He is alert and oriented to person, place, and time. He displays abnormal reflex (Absent at both knees). No cranial nerve deficit. Coordination normal.  Cogwheeling most evident in the right wrist. Gait is mildly abnormal with a suggestion of a festinating gait. Speech is soft and mildly pressured. There is a mild tremor at rest. There is no focal weakness. Fails to extinguish glabellar reflex. 1/14//15 MMSE 30/30. Passed clock drawing test.  Skin: No rash noted. No erythema. No pallor.  Corn at the tip of the left s third toe that is  quite tender.  Psychiatric:  Patient appears mildly anxious.      Labs reviewed: Lab Summary Latest Ref Rng 10/27/2015 09/12/2015 03/07/2015  Hemoglobin 13.5 - 17.5 g/dL 10.5(A) 10.4(A) 12.3(A)  Hematocrit 41 - 53 % 31(A) 31(A) 36(A)  White count - 4.5 5.4 4.9  Platelet count 150 - 399 K/L 156 177 160  Sodium 137 - 147 mmol/L (None) 139 140  Potassium 3.4 - 5.3 mmol/L (None) 4.0 4.1  Calcium - (None) (None) (None)  Phosphorus - (None) (None) (None)  Creatinine 0.6 - 1.3 mg/dL (None) 1.0 1.2  AST 14 - 40 U/L (None) 12(A) 17  Alk Phos 25 - 125 U/L (None) 64 50  Bilirubin - (None) (None) (None)  Glucose - (None) 87 95  Cholesterol - (None) (None) (None)  HDL cholesterol - (None) (None) (None)  Triglycerides - (None) (None) (None)  LDL Direct - (None) (None) (None)  LDL Calc - (None) (None) (None)  Total protein - (None) (None) (None)  Albumin - (None) (None) (None)   Lab Results    Component Value Date   TSH 2.56 09/12/2015   Lab Results  Component Value Date   BUN 16 09/12/2015   BUN 17 03/07/2015   BUN 15 05/24/2014   Lab Results  Component Value Date   CREATININE 1.0 09/12/2015   CREATININE 1.2 03/07/2015   CREATININE 1.2 05/24/2014   No results found for: HGBA1C  Ct Abdomen Pelvis W Contrast  01/31/2016  CLINICAL DATA:  Abdominal pain, constipation for few months. EXAM: CT ABDOMEN AND PELVIS WITH CONTRAST TECHNIQUE: Multidetector CT imaging of the abdomen and pelvis was performed using the standard protocol following bolus administration of intravenous contrast. CONTRAST:  170m ISOVUE-300 IOPAMIDOL (ISOVUE-300) INJECTION 61% COMPARISON:  None. FINDINGS: Lower chest:  No acute findings. Hepatobiliary: Status post cholecystectomy.  Liver appears normal. Pancreas: No mass, inflammatory changes, or other significant abnormality. Spleen: Within normal limits in size and appearance. Adrenals/Urinary Tract: Cystic-appearing mass directly posterior to the left kidney, measuring 5.9 x 2.8 x 6.9 cm (transverse by AP by craniocaudal dimensions), demonstrating CT density measurements of 15 to 18 Hounsfield units, most likely an exophytic benign renal cyst. Kidneys otherwise unremarkable bilaterally. No renal stone or hydronephrosis. No ureteral or bladder calculi identified. Prostate gland is enlarged causing mass effect on the bladder base. Stomach/Bowel: There appears to be irregular thickening of the walls of the lower rectum. Moderate amount of stool is present within the overlying rectal vault. More proximal colon is not distended, thereby excluding associated colonic obstruction. Moderate amount of stool and gas throughout the nondistended colon. Scattered diverticulosis noted within the sigmoid and lower descending colon but no focal inflammatory change to suggest acute diverticulitis. Sigmoid and lower descending colon are not adequately distended for definitive  characterization of their walls. Small bowel is normal in caliber and configuration. No thickening or inflammatory change of the small bowel walls. Stomach is moderately distended with fluid and air. Stomach is otherwise unremarkable. Appendix appears normal. Vascular/Lymphatic: Scattered atherosclerotic changes of the normal- caliber abdominal aorta. Additional atherosclerotic changes of the pelvic vasculature. Main portal vein measures 2 cm diameter suggesting some degree of portal venous hypertension. No varices seen. No enlarged lymph nodes appreciated within the abdomen or pelvis. Reproductive: Prostate gland is significantly enlarged causing mass effect on the bladder base. Other: None. Musculoskeletal: Extensive degenerative change at the bilateral hip joints, with at least mild acetabular protrusio bilaterally. Additional extensive degenerative change throughout the thoracolumbar spine. No acute or suspicious osseous  lesion. Superficial soft tissues are unremarkable. IMPRESSION: 1. Irregular thickening of the walls of the lower rectum, possibly benign wall hypertrophy due to chronic constipation. Currently, there is a moderate amount of stool within the overlying rectal vault and a moderate amount of stool and gas throughout the remainder of the nondistended colon, compatible with the given history of constipation. Neoplastic wall thickening cannot be confidently excluded. Consider direct endoscopic evaluation. 2. Walls of the sigmoid colon may also be somewhat thickened throughout but are difficult to definitively characterize due to inadequate distention. This could also be sequela of a chronic constipation or possibly related to previous episodes of diverticulitis. 3. Colonic diverticulosis without evidence of acute diverticulitis. 4. Significant enlargement of the prostate gland causing mass effect on the bladder base. Bladder otherwise unremarkable. 5. Prominent portal vein measuring 2 cm diameter,  suggesting some degree of portal venous hypertension. No obvious signs of liver cirrhosis seen. 6. Cystic-appearing mass posterior to the left kidney, measuring 5.9 x 2.8 x 6.9 cm, probably benign exophytic renal cyst. This is not a suspicious-appearing mass. At most, would consider follow-up renal protocol CT in 3-6 months to ensure stability. 7. Extensive degenerative change throughout the spine and at the bilateral hips. No acute-appearing osseous abnormality. Electronically Signed   By: Franki Cabot M.D.   On: 01/31/2016 14:35     Assessment/Plan  1. Weight loss Regaining some weight now Patient is on his third week of Paxil. I recommended that he continue with this at his current dose of 10 mg. No increase in tremor. Continue Megace  2. Weak Patient is a little more stable walking. No falls.  3. Peripheral polyneuropathy (HCC) Increased discomfort in the left foot  4. Parkinson's disease ([place]) Continue current dose of Sinemet  5. Essential hypertension  Controlled  6. Edema, unspecified type Improved  7. Slow transit constipation Functional bowel disorder. Recommended increase in bulk/fiber.  8. Diarrhea, unspecified type Functional bowel disorder.

## 2016-02-17 ENCOUNTER — Encounter: Payer: Self-pay | Admitting: Neurology

## 2016-02-17 ENCOUNTER — Ambulatory Visit (INDEPENDENT_AMBULATORY_CARE_PROVIDER_SITE_OTHER): Payer: Medicare Other | Admitting: Neurology

## 2016-02-17 VITALS — BP 136/84 | HR 62 | Ht 70.0 in | Wt 132.0 lb

## 2016-02-17 DIAGNOSIS — G629 Polyneuropathy, unspecified: Secondary | ICD-10-CM

## 2016-02-17 DIAGNOSIS — G20 Parkinson's disease: Secondary | ICD-10-CM | POA: Diagnosis not present

## 2016-02-17 DIAGNOSIS — R269 Unspecified abnormalities of gait and mobility: Secondary | ICD-10-CM

## 2016-02-17 MED ORDER — MIRTAZAPINE 15 MG PO TABS
ORAL_TABLET | ORAL | Status: DC
Start: 2016-02-17 — End: 2016-03-22

## 2016-02-17 NOTE — Progress Notes (Addendum)
Reason for visit: Parkinson's disease  Lawrence Turner is an 82 y.o. male  History of present illness:  Lawrence Turner is an 82 year old left-handed white male with a history of a gait disorder associated with Parkinson's disease. The patient has gained improvement with Sinemet taking 1.5 of the 25/100 mg tablets 3 times daily. The patient has been able tolerate this medication. He is using a walker, he is able to get up out of a chair at this point. He is not falling backwards as he was before. The patient was having freezing with the legs, this is not an issue at this time. He continues to have trouble with appetite, he is continuing to lose some weight. He is sleeping fairly well. He is on Paxil for anxiety problems. He does have a peripheral neuropathy, he has discomfort in the feet that is worse when he is up on his feet, better when he is resting, this does not impair his sleep. He denies low back pain.  Past Medical History  Diagnosis Date  . Mononeuritis of unspecified site   . Unspecified constipation   . Unspecified hemorrhoids without mention of complication   . Benign neoplasm of colon     pre-cancerous polyps removed at colonoscopy 2008  . Pure hypercholesterolemia   . Unspecified vitamin D deficiency   . Unspecified gastritis and gastroduodenitis without mention of hemorrhage 08/29/2006  . Other pulmonary embolism and infarction     following cholecystectomy 2003  . Anemia, unspecified   . Hypertrophy of prostate without urinary obstruction and other lower urinary tract symptoms (LUTS)   . Other B-complex deficiencies   . 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 Rosacea   . Osteoarthrosis, unspecified whether generalized or localized, pelvic region and thigh   . Diverticulosis of colon (without mention of hemorrhage)   . Contact dermatitis and other eczema, due to unspecified cause   . Esophageal reflux   . Unspecified essential hypertension   .  Peripheral neuropathy ([place])   . Abnormality of gait 10/19/2015  . Parkinson disease ([place]) 10/19/2015    Past Surgical History  Procedure Laterality Date  . Hemorrhoid surgery  1953  . Cholecystectomy, laparoscopic  2003  . Cataract extraction w/ intraocular lens implant Left 5/20//2013  . Colonic polyp removal  1965  . Colonoscopy  08/29/2006    hemorrhoids, 3 small polyps (hyperplastic) & diverticulosis    Family History  Problem Relation Age of Onset  . Cancer Mother     breast  . Pneumonia Father   . Cancer Brother     Social history:  reports that he quit smoking about 53 years ago. His smoking use included Cigarettes. He quit after 15 years of use. He has never used smokeless tobacco. He reports that he drinks about 1.2 oz of alcohol per week. He reports that he does not use illicit drugs.    Allergies  Allergen Reactions  . Lyrica [Pregabalin]     *Anticonvulsants*, fatique    Medications:  Prior to Admission medications   Medication Sig Start Date End Date Taking? Authorizing Provider  carbidopa-levodopa (SINEMET IR) 25-100 MG tablet Take 1.5 tablets by mouth 3 (three) times daily. 12/26/15  Yes Kathrynn Ducking, MD  megestrol (MEGACE ES) 625 MG/5ML suspension Take 5 cc by mouth daily to help stimulate appetite 02/01/16  Yes Estill Dooms, MD  Multiple Vitamin (MULTIVITAMIN) tablet Take 1 tablet by mouth daily.   Yes Historical Provider, MD  PARoxetine (PAXIL) 10 MG tablet Take one tablet daily 01/24/16  Yes Historical Provider, MD  polyethylene glycol powder (GLYCOLAX/MIRALAX) powder Take once daily 12/27/15  Yes Historical Provider, MD  ranitidine (ZANTAC) 150 MG tablet Take one at bedtime 01/31/16  Yes Historical Provider, MD  vitamin B-12 (CYANOCOBALAMIN) 1000 MCG tablet Take 1,000 mcg by mouth daily.   Yes Historical Provider, MD    ROS:  Out of a complete 14 system review of symptoms, the patient complains only of the following symptoms, and all other reviewed systems  are negative.  Decreased activity Difficulty swallowing Double vision Diarrhea Speech difficulty  Blood pressure 136/84, pulse 62, height 5\' 10"  (1.778 m), weight 132 lb (59.875 kg).  Physical Exam  General: The patient is alert and cooperative at the time of the examination.  Skin: 1+ edema of ankles is noted bilaterally.   Neurologic Exam  Mental status: The patient is alert and oriented x 3 at the time of the examination. The patient has apparent normal recent and remote memory, with an apparently normal attention span and concentration ability.   Cranial nerves: Facial symmetry is present. Speech is normal, no aphasia or dysarthria is noted. Extraocular movements are full. Visual fields are full. Masking of the face is seen.  Motor: The patient has good strength in all 4 extremities.  Sensory examination: Soft touch sensation is symmetric on the face, arms, and legs.  Coordination: The patient has good finger-nose-finger and heel-to-shin bilaterally.  Gait and station: The patient is able to stand from a seated position by pushing off of the arms. Once up, he has a stooped posture, able to ambulate independently, usually uses a walker. The patient has symmetric arm swing, some hesitation with turns. No freezing. Romberg is negative.  Reflexes: Deep tendon reflexes are symmetric.   MRI brain 10/23/15:  IMPRESSION: This MRI of the brain without contrast shows the following: 1. Mild to moderate cortical atrophy 2. Mild extent of T2/FLAIR hyperintense foci mostly in the parietal periventricular and deep white matter. This extent of chronic microvascular ischemic changes is common for age.  3. There are no acute findings.  * MRI scan images were reviewed online. I agree with the written report.    Assessment/Plan:  1. Parkinson's disease  2. Gait disorder  3. Weight loss, failure to thrive  4. Peripheral neuropathy  The patient is on Megace for  appetite, we will add mirtazapine to help, and we will stop the Paxil. The patient will continue on the Sinemet taking 1.5 of the 25/100 mg tablets 3 times daily. The patient will follow-up in 3 months. In the past, gabapentin and Lyrica did not help the neuropathy pain.   [facility] telephone number is [phone]. The fax number is [phone].  Jill Alexanders MD 02/17/2016 1:53 PM  [facility] [address] [place], [place] [address]  Phone [phone] Fax [phone]

## 2016-02-17 NOTE — Patient Instructions (Signed)
Parkinson Disease Parkinson disease is a disorder of the central nervous system, which includes the brain and spinal cord. A person with this disease slowly loses the ability to completely control body movements. Within the brain, there is a group of nerve cells (basal ganglia) that help control movement. The basal ganglia are damaged and do not work properly in a person with Parkinson disease. In addition, the basal ganglia produce and use a brain chemical called dopamine. The dopamine chemical sends messages to other parts of the body to control and coordinate body movements. Dopamine levels are low in a person with Parkinson disease. If the dopamine levels are low, then the body does not receive the correct messages it needs to move normally.  CAUSES  The exact reason why the basal ganglia get damaged is not known. Some medical researchers have thought that infection, genes, environment, and certain medicines may contribute to the cause.  SYMPTOMS   An early symptom of Parkinson disease is often an uncontrolled shaking (tremor) of the hands. The tremor will often disappear when the affected hand is consciously used.  As the disease progresses, walking, talking, getting out of a chair, and new movements become more difficult.  Muscles get stiff and movements become slower.  Balance and coordination become harder.  Depression, trouble swallowing, urinary problems, constipation, and sleep problems can occur.  Later in the disease, memory and thought processes may deteriorate. DIAGNOSIS  There are no specific tests to diagnose Parkinson disease. You may be referred to a neurologist for evaluation. Your caregiver will ask about your medical history, symptoms, and perform a physical exam. Blood tests and imaging tests of your brain may be performed to rule out other diseases. The imaging tests may include an MRI or a CT scan. TREATMENT  The goal of treatment is to relieve symptoms. Medicines may be  prescribed once the symptoms become troublesome. Medicine will not stop the progression of the disease, but medicine can make movement and balance better and help control tremors. Speech and occupational therapy may also be prescribed. Sometimes, surgical treatment of the brain can be done in young people. HOME CARE INSTRUCTIONS  Get regular exercise and rest periods during the day to help prevent exhaustion and depression.  If getting dressed becomes difficult, replace buttons and zippers with Velcro and elastic on your clothing.  Take all medicine as directed by your caregiver.  Install grab bars or railings in your home to prevent falls.  Go to speech or occupational therapy as directed.  Keep all follow-up visits as directed by your caregiver. SEEK MEDICAL CARE IF:  Your symptoms are not controlled with your medicine.  You fall.  You have trouble swallowing or choke on your food. MAKE SURE YOU:  Understand these instructions.  Will watch your condition.  Will get help right away if you are not doing well or get worse.   This information is not intended to replace advice given to you by your health care provider. Make sure you discuss any questions you have with your health care provider.   Document Released: 08/03/2000 Document Revised: 12/01/2012 Document Reviewed: 09/05/2011 Elsevier Interactive Patient Education 2016 [employer]  

## 2016-02-22 ENCOUNTER — Telehealth: Payer: Self-pay

## 2016-02-22 DIAGNOSIS — K5900 Constipation, unspecified: Secondary | ICD-10-CM | POA: Diagnosis not present

## 2016-02-22 DIAGNOSIS — K567 Ileus, unspecified: Secondary | ICD-10-CM | POA: Diagnosis not present

## 2016-02-22 NOTE — Telephone Encounter (Signed)
New orders for Remeron and to d/c Paxil faxed to [facility] # [phone].

## 2016-03-02 ENCOUNTER — Telehealth: Payer: Self-pay | Admitting: Neurology

## 2016-03-02 NOTE — Telephone Encounter (Signed)
Pt's son, [name], called requesting electrolyte panel from [facility]-  [phone], fa [phone] Son says pt has had constipation that then followed with diarrhea and new neuropathy pain and weakness.  May call son , [name] at [phone]

## 2016-03-22 ENCOUNTER — Telehealth: Payer: Self-pay | Admitting: Neurology

## 2016-03-22 MED ORDER — DULOXETINE HCL 30 MG PO CPEP: 30.0000 mg | ORAL_CAPSULE | Freq: Every day | ORAL | 1 refills | Status: DC

## 2016-03-22 NOTE — Addendum Note (Signed)
Addended by: Margette Fast on: 03/22/2016 04:32 PM   Modules accepted: Orders

## 2016-03-22 NOTE — Telephone Encounter (Signed)
I called patient. The patient had increased pain in the feet, left greater right which is worse with weightbearing, better when he is off of his feet. The pain starts in the morning when he first starts to walk. The pain sounds mechanical, not sure that it is related to his neuropathy. The patient questions whether the mirtazapine may have worsen the pain. We will stop the mirtazapine, I will start Cymbalta to see if this helps.

## 2016-03-22 NOTE — Telephone Encounter (Signed)
Pt's son, [name], called about pt's neuropathy. Says that it has increased in pain when he started mirtazapine (REMERON) 15 MG tablet. Son wants to know about stopping the medication until neuropathy clears up. Pt refuses to walk due to pain. May call pt [phone]

## 2016-03-25 IMAGING — CR DG HIP (WITH OR WITHOUT PELVIS) 2-3V*L*
3 series · 3 of 3 positions shown · non-contrast
Comparison: None.

CLINICAL DATA: Fall 6 days ago with persistent left hip pain,
initial encounter

EXAM:
DG HIP (WITH OR WITHOUT PELVIS) 2-3V LEFT

[w pelvis upright]
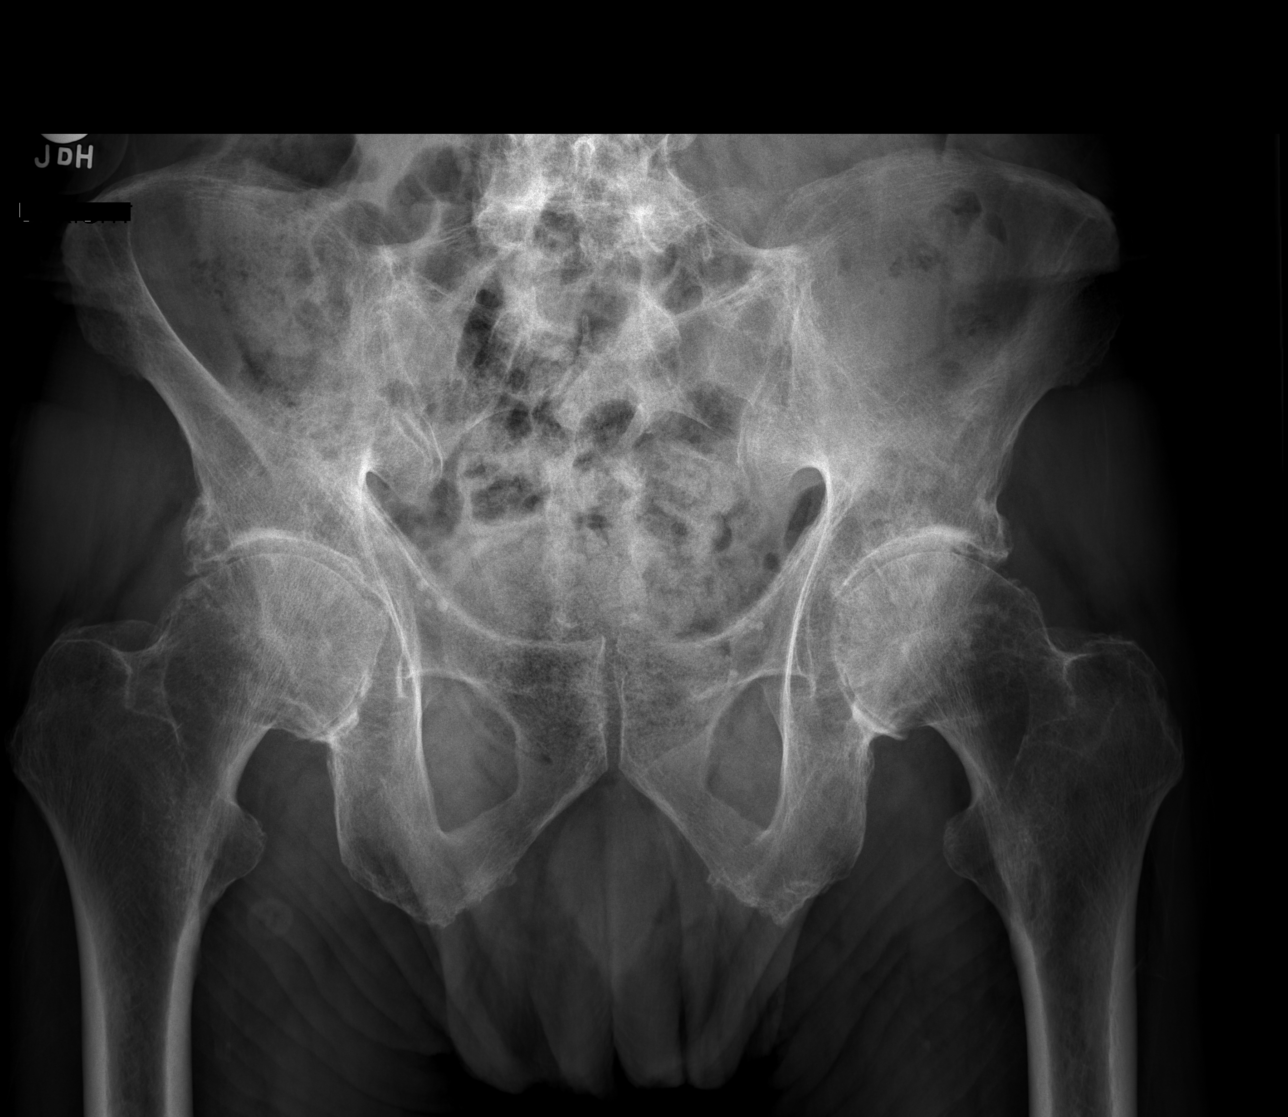

[w hip ap left]
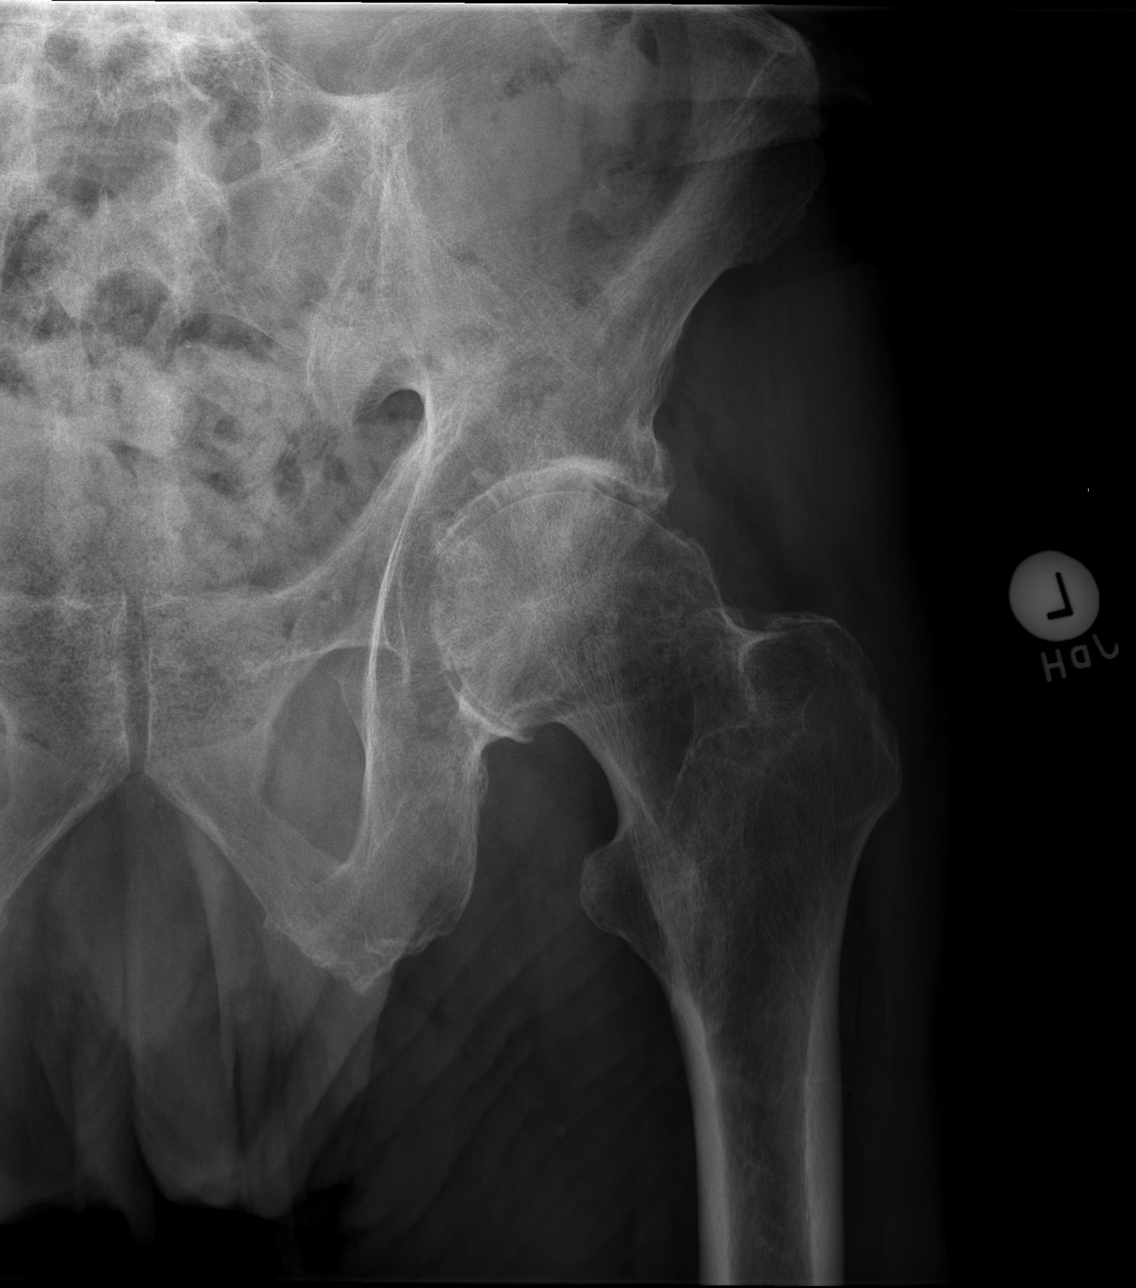

[w hip lat left]
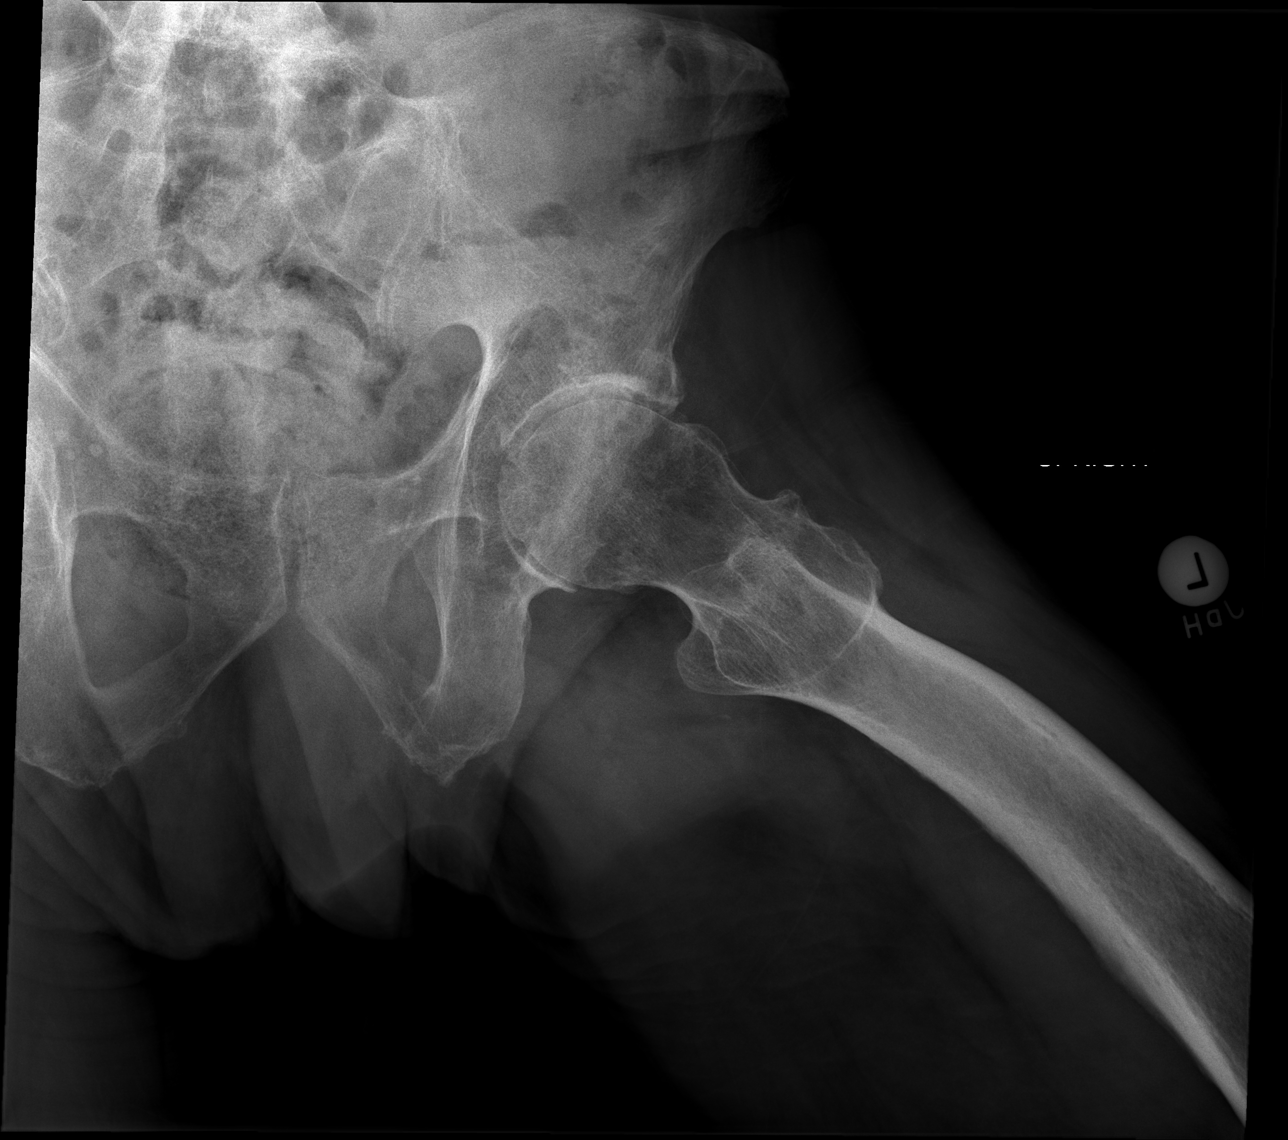

[3 of 3 positions shown; findings below may reference images not displayed]

FINDINGS: Degenerative changes of the hip joints are noted bilaterally with
small overhanging osteophytes arising from the femoral heads
bilaterally. Pelvic ring is intact. Degenerative changes of the
lumbar spine are seen. No soft tissue abnormality is noted.
IMPRESSION: Degenerative change without acute abnormality.

## 2016-04-03 ENCOUNTER — Encounter: Payer: Self-pay | Admitting: Internal Medicine

## 2016-04-03 ENCOUNTER — Non-Acute Institutional Stay: Payer: Medicare Other | Admitting: Internal Medicine

## 2016-04-03 VITALS — BP 110/62 | HR 60 | Temp 97.9°F | Resp 20 | Ht 70.0 in | Wt 135.0 lb

## 2016-04-03 DIAGNOSIS — R269 Unspecified abnormalities of gait and mobility: Secondary | ICD-10-CM

## 2016-04-03 DIAGNOSIS — I10 Essential (primary) hypertension: Secondary | ICD-10-CM | POA: Diagnosis not present

## 2016-04-03 DIAGNOSIS — K5901 Slow transit constipation: Secondary | ICD-10-CM | POA: Diagnosis not present

## 2016-04-03 DIAGNOSIS — L97522 Non-pressure chronic ulcer of other part of left foot with fat layer exposed: Secondary | ICD-10-CM | POA: Diagnosis not present

## 2016-04-03 DIAGNOSIS — G20 Parkinson's disease: Secondary | ICD-10-CM | POA: Diagnosis not present

## 2016-04-03 DIAGNOSIS — L97509 Non-pressure chronic ulcer of other part of unspecified foot with unspecified severity: Secondary | ICD-10-CM

## 2016-04-03 DIAGNOSIS — R634 Abnormal weight loss: Secondary | ICD-10-CM | POA: Diagnosis not present

## 2016-04-03 HISTORY — DX: Non-pressure chronic ulcer of other part of unspecified foot with unspecified severity: L97.509

## 2016-04-03 NOTE — Progress Notes (Signed)
[place] Room Number: HY85  Place of Service: Clinic (12)     Allergies  Allergen Reactions  . Lyrica [Pregabalin]     *Anticonvulsants*, fatique    Chief Complaint  Patient presents with  . Medical Management of Chronic Issues    4-6 week follow-up weight loss, gait, constipation  . Foot Pain    left foot for 2-3 weeks    HPI:  Weight loss - regained 9# since June 2017  Ulcer of foot, left, with fat layer exposed ([place]) - painful and has odor.Located at the distal end of the left fifth metatarsal. Tender to touch.  Abnormality of gait - unchanged and related to his parkinsonism  Slow transit constipation- improved on the current doses of Metamucil and MiraLAX  Parkinson's disease ([place]) - stable. Continues to follow-up with Dr. Jannifer Franklin, neurologist.  Essential hypertension - controlled    Medications: Patient's Medications  New Prescriptions   No medications on file  Previous Medications   ACETAMINOPHEN (TYLENOL) 325 MG TABLET    Take 650 mg by mouth. Take 2 tablets every 4 hours as needed for pain   AMINO ACIDS-PROTEIN HYDROLYS (FEEDING SUPPLEMENT, PRO-STAT SUGAR FREE 64,) LIQD    Take 30 mLs by mouth. 30 ml twice daily to promote wound healing   CARBIDOPA-LEVODOPA (SINEMET IR) 25-100 MG TABLET    Take 1.5 tablets by mouth 3 (three) times daily.   LACTULOSE (CHRONULAC) 10 GM/15ML SOLUTION    Take by mouth. Take 60 ml as needed for constipation   MEGESTROL (MEGACE) 40 MG/ML SUSPENSION    Take 53m daily to help stimulate appetite   MIRTAZAPINE (REMERON) 15 MG TABLET    Take 15 mg by mouth. Take 2 tablets at bedtime   MULTIPLE VITAMIN (MULTIVITAMIN) TABLET    Take 1 tablet by mouth daily.   NUTRITIONAL SUPPLEMENTS (BOOST PLUS PO)    Take by mouth. One can three times a day with meals   POLYETHYLENE GLYCOL POWDER (GLYCOLAX/MIRALAX) POWDER    Take once daily   PSYLLIUM (METAMUCIL) 58.6 % POWDER    Take 1 packet by mouth. One tablespoon in 4 oz  liquid daily   RANITIDINE (ZANTAC) 150 MG TABLET    Take one at bedtime   VITAMIN B-12 (CYANOCOBALAMIN) 1000 MCG TABLET    Take 1,000 mcg by mouth daily.  Modified Medications   No medications on file  Discontinued Medications   DULOXETINE (CYMBALTA) 30 MG CAPSULE    Take 1 capsule (30 mg total) by mouth daily.   MEGESTROL (MEGACE ES) 625 MG/5ML SUSPENSION    Take 5 cc by mouth daily to help stimulate appetite     Review of Systems  Constitutional: Positive for unexpected weight change (losiing weight). Negative for chills, diaphoresis and fever.  HENT: Negative for congestion, ear pain and hearing loss.   Eyes: Negative for photophobia, pain, discharge and redness.  Respiratory: Negative for cough, shortness of breath and wheezing.   Cardiovascular: Positive for leg swelling. Negative for chest pain and palpitations.       BLE edema  Gastrointestinal: Positive for constipation and diarrhea. Negative for abdominal pain, blood in stool and nausea.  Genitourinary: Negative.   Musculoskeletal: Negative for back pain, myalgias and neck pain.  Skin:       Multiple seborrheic keratoses.  Corn on the tip pf the left 3nd toe. Tender to touch.. Painful ulcer of the left fifth metatarsal area distally since mid-July 2017.  Neurological: Positive for  weakness. Negative for tremors, seizures and headaches.       Complaint of loss of motor skills and changes in. He is unstable with walking. He has a known peripheral neuropathy of uncertain origin. Micrographia. Pressured, soft speech that is nearly inaudible. History of parkinsonism currently treated with Sinemet.  Hematological:       History of anemia  Psychiatric/Behavioral: The patient is nervous/anxious.     Vitals:   04/03/16 1024  BP: 110/62  Pulse: 60  Resp: 20  Temp: 97.9 F (36.6 C)  TempSrc: Oral  SpO2: 90%  Weight: 135 lb (61.2 kg)  Height: 5' 10"  (1.778 m)   Wt Readings from Last 3 Encounters:  04/03/16 135 lb (61.2 kg)   02/17/16 132 lb (59.9 kg)  02/14/16 133 lb (60.3 kg)    Body mass index is 19.37 kg/m.  Physical Exam  Constitutional: He is oriented to person, place, and time.  Thin. Elderly. Protein calorie insufficiency with malnourishment.  HENT:  Right Ear: External ear normal.  Left Ear: External ear normal.  Nose: Nose normal.  Mouth/Throat: Oropharynx is clear and moist. No oropharyngeal exudate.  Eyes: Conjunctivae are normal. Pupils are equal, round, and reactive to light.  Neck: No JVD present. No tracheal deviation present. No thyromegaly present.  Cardiovascular: Normal rate, regular rhythm, normal heart sounds and intact distal pulses.  Exam reveals no gallop and no friction rub.   No murmur heard. Pulmonary/Chest: No respiratory distress. He has no wheezes. He has no rales. He exhibits no tenderness.  Abdominal: He exhibits no distension and no mass. There is no tenderness.  Musculoskeletal: Normal range of motion. He exhibits edema (1+ ipedal). He exhibits no tenderness.  unstable gait. 2+BLE edema.   Lymphadenopathy:    He has no cervical adenopathy.  Neurological: He is alert and oriented to person, place, and time. He displays abnormal reflex (Absent at both knees). No cranial nerve deficit. Coordination normal.  Cogwheeling most evident in the right wrist. Gait is mildly abnormal with a suggestion of a festinating gait. Speech is soft and mildly pressured. There is a mild tremor at rest. There is no focal weakness. Fails to extinguish glabellar reflex. 1/14//15 MMSE 30/30. Passed clock drawing test.  Skin: No rash noted. No erythema. No pallor.  Corn at the tip of the left s third toe that is quite tender. Deep ulceration at the distal end of the left fifth metatarsal. There was some bleeding in this. There is a foul odor. Wound is surrounded by thick callus.  Psychiatric:  Patient appears mildly anxious.      Labs reviewed: Lab Summary Latest Ref Rng & Units 10/27/2015  09/12/2015  Hemoglobin 13.5 - 17.5 g/dL 10.5(A) 10.4(A)  Hematocrit 41 - 53 % 31(A) 31(A)  White count 10:3/mL 4.5 5.4  Platelet count 150 - 399 K/L 156 177  Sodium 137 - 147 mmol/L (None) 139  Potassium 3.4 - 5.3 mmol/L (None) 4.0  Calcium - (None) (None)  Phosphorus - (None) (None)  Creatinine 0.6 - 1.3 mg/dL (None) 1.0  AST 14 - 40 U/L (None) 12(A)  Alk Phos 25 - 125 U/L (None) 64  Bilirubin - (None) (None)  Glucose mg/dL (None) 87  Cholesterol - (None) (None)  HDL cholesterol - (None) (None)  Triglycerides - (None) (None)  LDL Direct - (None) (None)  LDL Calc - (None) (None)  Total protein - (None) (None)  Albumin - (None) (None)  Some recent data might be hidden   Lab Results  Component Value Date   TSH 2.56 09/12/2015   Lab Results  Component Value Date   BUN 16 09/12/2015   BUN 17 03/07/2015   BUN 15 05/24/2014   Lab Results  Component Value Date   CREATININE 1.0 09/12/2015   CREATININE 1.2 03/07/2015   CREATININE 1.2 05/24/2014   No results found for: HGBA1C     Assessment/Plan  1. Ulcer of foot, left, with fat layer exposed ([place]) Sharply debrided; patient tolerated procedure well. Recommend daily soaks for 20 minutes in warm water and then apply Bactroban ointment and bandage.  2. Weight loss Regaining weight  3. Abnormality of gait Stable Parkinson's type gait  4. Slow transit constipation Continue MiraLAX and Metamucil  5. Parkinson's disease ([place]) Continue see Dr. Jannifer Franklin and stay on current medications  6. Essential hypertension Not currently treated and blood pressure is normal

## 2016-04-17 ENCOUNTER — Non-Acute Institutional Stay: Payer: Medicare Other | Admitting: Internal Medicine

## 2016-04-17 ENCOUNTER — Encounter: Payer: Self-pay | Admitting: Internal Medicine

## 2016-04-17 VITALS — BP 100/62 | HR 63 | Temp 98.1°F | Ht 70.0 in | Wt 133.0 lb

## 2016-04-17 DIAGNOSIS — W19XXXD Unspecified fall, subsequent encounter: Secondary | ICD-10-CM

## 2016-04-17 DIAGNOSIS — G20 Parkinson's disease: Secondary | ICD-10-CM

## 2016-04-17 DIAGNOSIS — R634 Abnormal weight loss: Secondary | ICD-10-CM

## 2016-04-17 DIAGNOSIS — I10 Essential (primary) hypertension: Secondary | ICD-10-CM

## 2016-04-17 DIAGNOSIS — L97522 Non-pressure chronic ulcer of other part of left foot with fat layer exposed: Secondary | ICD-10-CM | POA: Diagnosis not present

## 2016-04-17 DIAGNOSIS — R569 Unspecified convulsions: Secondary | ICD-10-CM

## 2016-04-17 HISTORY — DX: Unspecified convulsions: R56.9

## 2016-04-17 NOTE — Progress Notes (Signed)
[place] Room Number: IE33  Place of Service: Clinic (12)     Allergies  Allergen Reactions  . Lyrica [Pregabalin]     *Anticonvulsants*, fatique    Chief Complaint  Patient presents with  . Medical Management of Chronic Issues    2 week follow-up ulcer left foot. Here with wife and [name]  . Seizures    this morning, son was with him, would like a scan done today.  . Fall    04/10/16    HPI:  Ulcer of foot, left, with fat layer exposed ([place]) - Deep ulcer of the left foot. The distal end of the fifth metatarsal. Previously had foul odor, but this has improved. Pain has improved. Ulceration is surrounded by thick callus which is preventing closure.  Seizures [facility]) - wife notified staff yesterday the patient's body stiffened up and his eyes were looking at the ceiling for approximately 30 seconds. After he woke up patient stated "I'm fine". There is been no pain or dizziness. He denies headaches. No increased clumsiness. On 04/09/2016 Tech notified the nurse and resident did not respond for a few seconds at a time and his eyes rolled up toward the ceiling. When he awoke after that the resident stated "I fell asleep". The third episode was this morning 04/17/2016 and it was similar to the previous reports, there is a staring and then nonverbal. Where the eyes rolled up. There has been no general convulsion with any of these. Bladder control and bowel control was maintained. Patient comes back to a normal state quickly. There has been no recent fever, chills, or other illness identified. There is been no recent change in his medication.  Weight loss - weight remained stable  Parkinson's disease ([place]) - unchanged  Essential hypertension - controlled  Fall, subsequent encounter - there was a fall without head injury or other injury on 04/10/2016.    Medications: Patient's Medications  New Prescriptions   No medications on file  Previous Medications   ACETAMINOPHEN (TYLENOL) 325 MG TABLET    Take 650 mg by mouth. Take 2 tablets every 4 hours as needed for pain   AMINO ACIDS-PROTEIN HYDROLYS (FEEDING SUPPLEMENT, PRO-STAT SUGAR FREE 64,) LIQD    Take 30 mLs by mouth. 30 ml twice daily to promote wound healing   CARBIDOPA-LEVODOPA (SINEMET IR) 25-100 MG TABLET    Take 1.5 tablets by mouth 3 (three) times daily.   LACTULOSE (CHRONULAC) 10 GM/15ML SOLUTION    Take by mouth. Take 60 ml as needed for constipation   MEGESTROL (MEGACE) 40 MG/ML SUSPENSION    Take 29m daily to help stimulate appetite   MIRTAZAPINE (REMERON) 15 MG TABLET    Take 15 mg by mouth. Take 2 tablets at bedtime   MULTIPLE VITAMIN (MULTIVITAMIN) TABLET    Take 1 tablet by mouth daily.   MUPIROCIN OINTMENT (BACTROBAN) 2 %    Place 1 application into the nose. Apply to left 5th toe ulcer after soaking   NUTRITIONAL SUPPLEMENTS (BOOST PLUS PO)    Take by mouth. One can three times a day with meals   POLYETHYLENE GLYCOL POWDER (GLYCOLAX/MIRALAX) POWDER    Take once daily   PSYLLIUM (METAMUCIL) 58.6 % POWDER    Take 1 packet by mouth. One tablespoon in 4 oz liquid daily   RANITIDINE (ZANTAC) 150 MG TABLET    Take one at bedtime   VITAMIN B-12 (CYANOCOBALAMIN) 1000 MCG TABLET    Take 1,000 mcg  by mouth daily.  Modified Medications   No medications on file  Discontinued Medications   No medications on file     Review of Systems  Constitutional: Positive for unexpected weight change (losiing weight). Negative for chills, diaphoresis and fever.  HENT: Negative for congestion, ear pain and hearing loss.   Eyes: Negative for photophobia, pain, discharge and redness.  Respiratory: Negative for cough, shortness of breath and wheezing.   Cardiovascular: Positive for leg swelling. Negative for chest pain and palpitations.       BLE edema  Gastrointestinal: Positive for constipation and diarrhea. Negative for abdominal pain, blood in stool and nausea.  Genitourinary: Negative.     Musculoskeletal: Negative for back pain, myalgias and neck pain.  Skin:       Multiple seborrheic keratoses.  Corn on the tip pf the left 3nd toe.  Ulcer of the left fifth metatarsal area distally since mid-July 2017.  Neurological: Positive for seizures (Altered consciousness with sparing but no generalized tonic-clonic activity.) and weakness. Negative for tremors and headaches.       Complaint of loss of motor skills and changes in. He is unstable with walking. He has a known peripheral neuropathy of uncertain origin. Micrographia. Pressured, soft speech that is nearly inaudible. History of parkinsonism currently treated with Sinemet.  Hematological:       History of anemia  Psychiatric/Behavioral: The patient is nervous/anxious.     Vitals:   04/17/16 1005  BP: 100/62  Pulse: 63  Temp: 98.1 F (36.7 C)  TempSrc: Oral  SpO2: 97%  Weight: 133 lb (60.3 kg)  Height: _0  (1.778 m)   Wt Readings from Last 3 Encounters:  04/17/16 133 lb (60.3 kg)  04/03/16 135 lb (61.2 kg)  02/17/16 132 lb (59.9 kg)    Body mass index is 19.08 kg/m.  Physical Exam  Constitutional: He is oriented to person, place, and time.  Thin. Elderly. Protein calorie insufficiency with malnourishment.  HENT:  Right Ear: External ear normal.  Left Ear: External ear normal.  Nose: Nose normal.  Mouth/Throat: Oropharynx is clear and moist. No oropharyngeal exudate.  Eyes: Conjunctivae are normal. Pupils are equal, round, and reactive to light.  Neck: No JVD present. No tracheal deviation present. No thyromegaly present.  Cardiovascular: Normal rate, regular rhythm, normal heart sounds and intact distal pulses.  Exam reveals no gallop and no friction rub.   No murmur heard. Pulmonary/Chest: No respiratory distress. He has no wheezes. He has no rales. He exhibits no tenderness.  Abdominal: He exhibits no distension and no mass. There is no tenderness.  Musculoskeletal: Normal range of motion. He  exhibits edema (1+ ipedal). He exhibits no tenderness.  unstable gait. 2+BLE edema.   Lymphadenopathy:    He has no cervical adenopathy.  Neurological: He is alert and oriented to person, place, and time. He displays abnormal reflex (Absent at both knees). No cranial nerve deficit. Coordination normal.  Cogwheeling most evident in the right wrist. Gait is mildly abnormal with a suggestion of a festinating gait. Speech is soft and mildly pressured. There is a mild tremor at rest. There is no focal weakness. Fails to extinguish glabellar reflex. 1/14//15 MMSE 30/30. Passed clock drawing test.  Skin: No rash noted. No erythema. No pallor.  Corn at the tip of the left s third toe is healing. Deep ulceration at the distal end of the left fifth metatarsal.. Wound is surrounded by thick callus. 6 mm sacral decubitus   Psychiatric:  Patient  appears mildly anxious.      Labs reviewed: Lab Summary Latest Ref Rng & Units 10/27/2015 09/12/2015  Hemoglobin 13.5 - 17.5 g/dL 10.5(A) 10.4(A)  Hematocrit 41 - 53 % 31(A) 31(A)  White count 10:3/mL 4.5 5.4  Platelet count 150 - 399 K/L 156 177  Sodium 137 - 147 mmol/L (None) 139  Potassium 3.4 - 5.3 mmol/L (None) 4.0  Calcium - (None) (None)  Phosphorus - (None) (None)  Creatinine 0.6 - 1.3 mg/dL (None) 1.0  AST 14 - 40 U/L (None) 12(A)  Alk Phos 25 - 125 U/L (None) 64  Bilirubin - (None) (None)  Glucose mg/dL (None) 87  Cholesterol - (None) (None)  HDL cholesterol - (None) (None)  Triglycerides - (None) (None)  LDL Direct - (None) (None)  LDL Calc - (None) (None)  Total protein - (None) (None)  Albumin - (None) (None)  Some recent data might be hidden   Lab Results  Component Value Date   TSH 2.56 09/12/2015   Lab Results  Component Value Date   BUN 16 09/12/2015   BUN 17 03/07/2015   BUN 15 05/24/2014   Lab Results  Component Value Date   CREATININE 1.0 09/12/2015   CREATININE 1.2 03/07/2015   CREATININE 1.2 05/24/2014   No  results found for: HGBA1C     Assessment/Plan  1. Ulcer of foot, left, with fat layer exposed ([place]) Wound was sharply debrided and excessive callus was removed. There was slight bleeding which resolved quickly following silver nitrate application.  2. Seizures ([place]) New onset problem of 3 episodes of altered consciousness with staring. History would be suggestive of petit mal seizure. No previous history of seizure. Recent MRI of the brain on 10/23/2015 showed mild-to-moderate cortical atrophy and small vessel disease. There were no other significant findings. -Referred to Dr. Jannifer Franklin, neurologist -EEG  3. Weight loss Stable  4. Parkinson's disease ([place]) Unchanged  5. Essential hypertension Controlled  6. Fall, subsequent encounter Non-injurious

## 2016-04-18 ENCOUNTER — Encounter (HOSPITAL_COMMUNITY): Payer: Self-pay | Admitting: Emergency Medicine

## 2016-04-18 ENCOUNTER — Emergency Department (HOSPITAL_COMMUNITY): Payer: Medicare Other

## 2016-04-18 ENCOUNTER — Encounter: Payer: Self-pay | Admitting: Neurology

## 2016-04-18 ENCOUNTER — Inpatient Hospital Stay (HOSPITAL_COMMUNITY)
Admission: EM | Admit: 2016-04-18 | Discharge: 2016-04-30 | DRG: 644 | Disposition: A | Discharge status: Skilled Nursing Facility | Payer: Medicare Other | Attending: Internal Medicine | Admitting: Internal Medicine

## 2016-04-18 ENCOUNTER — Ambulatory Visit (INDEPENDENT_AMBULATORY_CARE_PROVIDER_SITE_OTHER): Payer: Medicare Other | Admitting: Neurology

## 2016-04-18 VITALS — BP 108/67 | HR 53 | Ht 70.0 in | Wt 139.5 lb

## 2016-04-18 DIAGNOSIS — R569 Unspecified convulsions: Secondary | ICD-10-CM | POA: Diagnosis not present

## 2016-04-18 DIAGNOSIS — Z87891 Personal history of nicotine dependence: Secondary | ICD-10-CM

## 2016-04-18 DIAGNOSIS — R269 Unspecified abnormalities of gait and mobility: Secondary | ICD-10-CM | POA: Diagnosis not present

## 2016-04-18 DIAGNOSIS — G20 Parkinson's disease: Secondary | ICD-10-CM

## 2016-04-18 DIAGNOSIS — Z9842 Cataract extraction status, left eye: Secondary | ICD-10-CM

## 2016-04-18 DIAGNOSIS — E2749 Other adrenocortical insufficiency: Principal | ICD-10-CM | POA: Diagnosis present

## 2016-04-18 DIAGNOSIS — R55 Syncope and collapse: Secondary | ICD-10-CM | POA: Diagnosis present

## 2016-04-18 DIAGNOSIS — N179 Acute kidney failure, unspecified: Secondary | ICD-10-CM | POA: Diagnosis present

## 2016-04-18 DIAGNOSIS — Z66 Do not resuscitate: Secondary | ICD-10-CM | POA: Diagnosis present

## 2016-04-18 DIAGNOSIS — M1610 Unilateral primary osteoarthritis, unspecified hip: Secondary | ICD-10-CM | POA: Diagnosis present

## 2016-04-18 DIAGNOSIS — R0600 Dyspnea, unspecified: Secondary | ICD-10-CM | POA: Diagnosis not present

## 2016-04-18 DIAGNOSIS — R5382 Chronic fatigue, unspecified: Secondary | ICD-10-CM

## 2016-04-18 DIAGNOSIS — Z79899 Other long term (current) drug therapy: Secondary | ICD-10-CM

## 2016-04-18 DIAGNOSIS — L89152 Pressure ulcer of sacral region, stage 2: Secondary | ICD-10-CM | POA: Diagnosis not present

## 2016-04-18 DIAGNOSIS — L97509 Non-pressure chronic ulcer of other part of unspecified foot with unspecified severity: Secondary | ICD-10-CM | POA: Diagnosis present

## 2016-04-18 DIAGNOSIS — G629 Polyneuropathy, unspecified: Secondary | ICD-10-CM | POA: Diagnosis present

## 2016-04-18 DIAGNOSIS — E559 Vitamin D deficiency, unspecified: Secondary | ICD-10-CM | POA: Diagnosis present

## 2016-04-18 DIAGNOSIS — E7800 Pure hypercholesterolemia, unspecified: Secondary | ICD-10-CM | POA: Diagnosis present

## 2016-04-18 DIAGNOSIS — E860 Dehydration: Secondary | ICD-10-CM | POA: Diagnosis present

## 2016-04-18 DIAGNOSIS — I951 Orthostatic hypotension: Secondary | ICD-10-CM | POA: Insufficient documentation

## 2016-04-18 DIAGNOSIS — K5730 Diverticulosis of large intestine without perforation or abscess without bleeding: Secondary | ICD-10-CM | POA: Diagnosis present

## 2016-04-18 DIAGNOSIS — R404 Transient alteration of awareness: Secondary | ICD-10-CM | POA: Diagnosis present

## 2016-04-18 DIAGNOSIS — Z961 Presence of intraocular lens: Secondary | ICD-10-CM | POA: Diagnosis present

## 2016-04-18 DIAGNOSIS — L97529 Non-pressure chronic ulcer of other part of left foot with unspecified severity: Secondary | ICD-10-CM | POA: Diagnosis present

## 2016-04-18 DIAGNOSIS — I10 Essential (primary) hypertension: Secondary | ICD-10-CM | POA: Diagnosis present

## 2016-04-18 DIAGNOSIS — K5900 Constipation, unspecified: Secondary | ICD-10-CM | POA: Diagnosis present

## 2016-04-18 DIAGNOSIS — D649 Anemia, unspecified: Secondary | ICD-10-CM | POA: Diagnosis present

## 2016-04-18 DIAGNOSIS — K219 Gastro-esophageal reflux disease without esophagitis: Secondary | ICD-10-CM | POA: Diagnosis present

## 2016-04-18 DIAGNOSIS — L8992 Pressure ulcer of unspecified site, stage 2: Secondary | ICD-10-CM | POA: Diagnosis present

## 2016-04-18 DIAGNOSIS — Z888 Allergy status to other drugs, medicaments and biological substances status: Secondary | ICD-10-CM

## 2016-04-18 DIAGNOSIS — Z79818 Long term (current) use of other agents affecting estrogen receptors and estrogen levels: Secondary | ICD-10-CM

## 2016-04-18 DIAGNOSIS — G908 Other disorders of autonomic nervous system: Secondary | ICD-10-CM | POA: Diagnosis present

## 2016-04-18 HISTORY — DX: Orthostatic hypotension: I95.1

## 2016-04-18 HISTORY — DX: Syncope and collapse: R55

## 2016-04-18 LAB — COMPREHENSIVE METABOLIC PANEL
ALT: 9 U/L — AB (ref 17–63)
AST: 24 U/L (ref 15–41)
Albumin: 3.3 g/dL — ABNORMAL LOW (ref 3.5–5.0)
Alkaline Phosphatase: 44 U/L (ref 38–126)
Anion gap: 8 (ref 5–15)
BUN: 28 mg/dL — AB (ref 6–20)
CHLORIDE: 109 mmol/L (ref 101–111)
CO2: 20 mmol/L — AB (ref 22–32)
CREATININE: 1.26 mg/dL — AB (ref 0.61–1.24)
Calcium: 9.1 mg/dL (ref 8.9–10.3)
GFR calc Af Amer: 57 mL/min — ABNORMAL LOW (ref >60)
GFR, EST NON AFRICAN AMERICAN: 49 mL/min — AB (ref >60)
GLUCOSE: 133 mg/dL — AB (ref 65–99)
Potassium: 3.8 mmol/L (ref 3.5–5.1)
SODIUM: 137 mmol/L (ref 135–145)
Total Bilirubin: 0.3 mg/dL (ref 0.3–1.2)
Total Protein: 6.1 g/dL — ABNORMAL LOW (ref 6.5–8.1)

## 2016-04-18 LAB — CBC
HCT: 37.2 % — ABNORMAL LOW (ref 39.0–52.0)
Hemoglobin: 12.3 g/dL — ABNORMAL LOW (ref 13.0–17.0)
MCH: 31.8 pg (ref 26.0–34.0)
MCHC: 33.1 g/dL (ref 30.0–36.0)
MCV: 96.1 fL (ref 78.0–100.0)
PLATELETS: 191 10*3/uL (ref 150–400)
RBC: 3.87 MIL/uL — AB (ref 4.22–5.81)
RDW: 15.5 % (ref 11.5–15.5)
WBC: 10.4 10*3/uL (ref 4.0–10.5)

## 2016-04-18 NOTE — ED Triage Notes (Signed)
Patient was seen in his neurology office today, had a seizure and low blood pressure in the office x3 and was sent back to [place].  Patient is here with son.  Patient had episodes of shortness of breath per son and did not feel comfortable leaving patient at SNF without evaluation.  Patient does admit to having some shortness of breath.

## 2016-04-18 NOTE — Progress Notes (Signed)
Reason for visit: Syncope  Lawrence Turner is an 82 y.o. male  History of present illness:  Lawrence Turner is an 82 year old left-handed white male with a history of Parkinson's disease associated with a gait disorder. Within the last 2 weeks, the patient has had at least 3 episodes of altered mental status, or syncope. The patient first became unresponsive for several minutes, EMS was called, the patient remembers waking up when EMS had arrived. The patient felt that he had gone to sleep. The patient has had at least 2 other episodes lasting about 30 seconds where he would stare off, not responding. The patient reports some blurring of vision following the episodes but he has no warning going into the episode. He denies any chest pain, but his family has noted some shortness of breath around the time of the events. The patient reports no double vision or loss of vision. He reports no new numbness or weakness of the face, arms, or legs. He denies any tongue biting or loss of control of the bowels or the bladder. He is sent to this office for an evaluation.  Past Medical History:  Diagnosis Date  . Abnormality of gait 10/19/2015  . Anemia, unspecified   . Benign neoplasm of colon    pre-cancerous polyps removed at colonoscopy 2008  . Contact dermatitis and other eczema, due to unspecified cause   . Diverticulosis of colon (without mention of hemorrhage)   . Esophageal reflux   . Hypertrophy of prostate without urinary obstruction and other lower urinary tract symptoms (LUTS)   . Mononeuritis of unspecified site   . Osteoarthrosis, unspecified whether generalized or localized, pelvic region and thigh   . Other B-complex deficiencies   . Other pulmonary embolism and infarction    following cholecystectomy 2003  . Parkinson disease ([place]) 10/19/2015  . Peripheral neuropathy ([place])   . Pure hypercholesterolemia   . Rosacea   . Seizures ([place]) 04/17/2016  . 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 Ulcer of foot ([place]) 04/03/2016  . Unspecified constipation   . Unspecified essential hypertension   . Unspecified gastritis and gastroduodenitis without mention of hemorrhage 08/29/2006  . Unspecified hemorrhoids without mention of complication   . Unspecified vitamin D deficiency     Past Surgical History:  Procedure Laterality Date  . CATARACT EXTRACTION W/ INTRAOCULAR LENS IMPLANT Left 5/20//2013  . CHOLECYSTECTOMY, LAPAROSCOPIC  2003  . colonic polyp removal  1965  . COLONOSCOPY  08/29/2006   hemorrhoids, 3 small polyps (hyperplastic) & diverticulosis  . HEMORRHOID SURGERY  1953    Family History  Problem Relation Age of Onset  . Cancer Mother     breast  . Pneumonia Father   . Cancer Brother     Social history:  reports that he quit smoking about 53 years ago. His smoking use included Cigarettes. He quit after 15.00 years of use. He has never used smokeless tobacco. He reports that he drinks about 1.2 oz of alcohol per week . He reports that he does not use drugs.    Allergies  Allergen Reactions  . Lyrica [Pregabalin]     *Anticonvulsants*, fatique    Medications:  Prior to Admission medications   Medication Sig Start Date End Date Taking? Authorizing Provider  acetaminophen (TYLENOL) 325 MG tablet Take 650 mg by mouth. Take 2 tablets every 4 hours as needed for pain   Yes Historical Provider, MD  Amino Acids-Protein Hydrolys (FEEDING SUPPLEMENT, PRO-STAT SUGAR  FREE 64,) LIQD Take 30 mLs by mouth. 30 ml twice daily to promote wound healing   Yes Historical Provider, MD  carbidopa-levodopa (SINEMET IR) 25-100 MG tablet Take 1.5 tablets by mouth 3 (three) times daily. 12/26/15  Yes Kathrynn Ducking, MD  lactulose [facility]) 10 GM/15ML solution Take by mouth. Take 60 ml as needed for constipation   Yes Historical Provider, MD  megestrol (MEGACE) 40 MG/ML suspension Take 19ml daily to help stimulate appetite 03/20/16  Yes Historical Provider, MD  mirtazapine  (REMERON) 15 MG tablet Take 15 mg by mouth. Take 2 tablets at bedtime   Yes Historical Provider, MD  Multiple Vitamin (MULTIVITAMIN) tablet Take 1 tablet by mouth daily.   Yes Historical Provider, MD  mupirocin ointment (BACTROBAN) 2 % Place 1 application into the nose. Apply to left 5th toe ulcer after soaking   Yes Historical Provider, MD  Nutritional Supplements (BOOST PLUS PO) Take by mouth. One can three times a day with meals   Yes Historical Provider, MD  polyethylene glycol powder (GLYCOLAX/MIRALAX) powder Take once daily 12/27/15  Yes Historical Provider, MD  psyllium (METAMUCIL) 58.6 % powder Take 1 packet by mouth. One tablespoon in 4 oz liquid daily   Yes Historical Provider, MD  ranitidine (ZANTAC) 150 MG tablet Take one at bedtime 01/31/16  Yes Historical Provider, MD  vitamin B-12 (CYANOCOBALAMIN) 1000 MCG tablet Take 1,000 mcg by mouth daily.   Yes Historical Provider, MD    ROS:  Out of a complete 14 system review of symptoms, the patient complains only of the following symptoms, and all other reviewed systems are negative.  Weight loss, fatigue Joint pain Slurred speech Seizures Decreased energy  Blood pressure 108/67, pulse (!) 53, height 5\' 10"  (1.778 m), weight 139 lb 8 oz (63.3 kg).   Blood pressure, right arm, sitting is 118/64. Blood pressure, right arm, standing is 70 systolic.  Physical Exam  General: The patient is alert and cooperative at the time of the examination.  Neck: Neck is supple, no carotid bruits are noted.  Respiratory: Slight bilateral mild rhonchi are noted.  Cardiovascular: Regular rate and rhythm, no murmurs noted.  Skin: No significant peripheral edema is noted.   Neurologic Exam  Mental status: The patient is alert and oriented x 3 at the time of the examination. The patient has apparent normal recent and remote memory, with an apparently normal attention span and concentration ability.   Cranial nerves: Facial symmetry is present.  Speech is normal, no aphasia or dysarthria is noted. Extraocular movements are full. Visual fields are full. Masking of the face is seen.  Motor: The patient has good strength in all 4 extremities.  Sensory examination: Soft touch sensation is symmetric on the face, arms, and legs.  Coordination: The patient has good finger-nose-finger and heel-to-shin bilaterally.  Gait and station: The patient is able to walk with a walker, takes relatively good strides, some hesitation with turns.  Reflexes: Deep tendon reflexes are symmetric, but are depressed.   Assessment/Plan:  1. Parkinson's disease  2. Gait disorder  3. Orthostatic hypotension, syncope and presyncope  The patient has had a demonstrated event in the office where he became very dyspneic, with a decreased responsive state, blood pressure around this time was 70 systolic, this represents almost a 50 point drop in blood pressure from sitting to standing. The patient is deconditioned, he has lost weight recently. The patient has had a cough for several months. The patient will be sent for blood  work today, he will have a chest x-ray and a 2-D echocardiogram. If the workup is unremarkable, we may add medication such as midodrine for the orthostasis. The patient is encouraged to drink plenty of fluids, and add salt to the diet. I have discussed getting the patient compression stockings, but the son does not believe that he can put on the stockings by himself.  Jill Alexanders MD 04/18/2016 4:32 PM  [facility] [address] [place], [place] [address]  Phone [phone] Fax [phone]

## 2016-04-18 NOTE — Patient Instructions (Signed)
   We will check a 2D echo of the heart, get a Chest XR and get blood work. We may consider a medication to help keep up the blood pressure. Make sure he gets plenty of food and fluids.

## 2016-04-19 ENCOUNTER — Encounter (HOSPITAL_COMMUNITY): Payer: Self-pay | Admitting: Internal Medicine

## 2016-04-19 ENCOUNTER — Observation Stay (HOSPITAL_COMMUNITY): Payer: Medicare Other

## 2016-04-19 ENCOUNTER — Telehealth: Payer: Self-pay | Admitting: Neurology

## 2016-04-19 DIAGNOSIS — K5901 Slow transit constipation: Secondary | ICD-10-CM | POA: Diagnosis not present

## 2016-04-19 DIAGNOSIS — L84 Corns and callosities: Secondary | ICD-10-CM | POA: Diagnosis not present

## 2016-04-19 DIAGNOSIS — G908 Other disorders of autonomic nervous system: Secondary | ICD-10-CM | POA: Diagnosis present

## 2016-04-19 DIAGNOSIS — K5730 Diverticulosis of large intestine without perforation or abscess without bleeding: Secondary | ICD-10-CM | POA: Diagnosis present

## 2016-04-19 DIAGNOSIS — Z79899 Other long term (current) drug therapy: Secondary | ICD-10-CM | POA: Diagnosis not present

## 2016-04-19 DIAGNOSIS — N400 Enlarged prostate without lower urinary tract symptoms: Secondary | ICD-10-CM | POA: Diagnosis not present

## 2016-04-19 DIAGNOSIS — E2749 Other adrenocortical insufficiency: Secondary | ICD-10-CM | POA: Diagnosis not present

## 2016-04-19 DIAGNOSIS — M6281 Muscle weakness (generalized): Secondary | ICD-10-CM | POA: Diagnosis not present

## 2016-04-19 DIAGNOSIS — H47013 Ischemic optic neuropathy, bilateral: Secondary | ICD-10-CM | POA: Diagnosis not present

## 2016-04-19 DIAGNOSIS — Z7409 Other reduced mobility: Secondary | ICD-10-CM | POA: Diagnosis not present

## 2016-04-19 DIAGNOSIS — R404 Transient alteration of awareness: Secondary | ICD-10-CM | POA: Diagnosis not present

## 2016-04-19 DIAGNOSIS — G20 Parkinson's disease: Secondary | ICD-10-CM | POA: Diagnosis not present

## 2016-04-19 DIAGNOSIS — Z66 Do not resuscitate: Secondary | ICD-10-CM | POA: Diagnosis present

## 2016-04-19 DIAGNOSIS — R55 Syncope and collapse: Secondary | ICD-10-CM | POA: Diagnosis present

## 2016-04-19 DIAGNOSIS — K5900 Constipation, unspecified: Secondary | ICD-10-CM | POA: Diagnosis present

## 2016-04-19 DIAGNOSIS — D519 Vitamin B12 deficiency anemia, unspecified: Secondary | ICD-10-CM | POA: Diagnosis not present

## 2016-04-19 DIAGNOSIS — D126 Benign neoplasm of colon, unspecified: Secondary | ICD-10-CM | POA: Diagnosis not present

## 2016-04-19 DIAGNOSIS — E7800 Pure hypercholesterolemia, unspecified: Secondary | ICD-10-CM | POA: Diagnosis present

## 2016-04-19 DIAGNOSIS — Z87891 Personal history of nicotine dependence: Secondary | ICD-10-CM | POA: Diagnosis not present

## 2016-04-19 DIAGNOSIS — I959 Hypotension, unspecified: Secondary | ICD-10-CM | POA: Diagnosis not present

## 2016-04-19 DIAGNOSIS — R262 Difficulty in walking, not elsewhere classified: Secondary | ICD-10-CM | POA: Diagnosis not present

## 2016-04-19 DIAGNOSIS — R1312 Dysphagia, oropharyngeal phase: Secondary | ICD-10-CM | POA: Diagnosis not present

## 2016-04-19 DIAGNOSIS — M2042 Other hammer toe(s) (acquired), left foot: Secondary | ICD-10-CM | POA: Diagnosis not present

## 2016-04-19 DIAGNOSIS — D649 Anemia, unspecified: Secondary | ICD-10-CM | POA: Diagnosis not present

## 2016-04-19 DIAGNOSIS — I10 Essential (primary) hypertension: Secondary | ICD-10-CM | POA: Diagnosis present

## 2016-04-19 DIAGNOSIS — L89152 Pressure ulcer of sacral region, stage 2: Secondary | ICD-10-CM | POA: Diagnosis present

## 2016-04-19 DIAGNOSIS — L97529 Non-pressure chronic ulcer of other part of left foot with unspecified severity: Secondary | ICD-10-CM | POA: Diagnosis present

## 2016-04-19 DIAGNOSIS — L97509 Non-pressure chronic ulcer of other part of unspecified foot with unspecified severity: Secondary | ICD-10-CM | POA: Diagnosis not present

## 2016-04-19 DIAGNOSIS — R2681 Unsteadiness on feet: Secondary | ICD-10-CM | POA: Diagnosis not present

## 2016-04-19 DIAGNOSIS — N179 Acute kidney failure, unspecified: Secondary | ICD-10-CM | POA: Diagnosis present

## 2016-04-19 DIAGNOSIS — E860 Dehydration: Secondary | ICD-10-CM | POA: Diagnosis present

## 2016-04-19 DIAGNOSIS — Z961 Presence of intraocular lens: Secondary | ICD-10-CM | POA: Diagnosis present

## 2016-04-19 DIAGNOSIS — M1610 Unilateral primary osteoarthritis, unspecified hip: Secondary | ICD-10-CM | POA: Diagnosis present

## 2016-04-19 DIAGNOSIS — G629 Polyneuropathy, unspecified: Secondary | ICD-10-CM | POA: Diagnosis not present

## 2016-04-19 DIAGNOSIS — R269 Unspecified abnormalities of gait and mobility: Secondary | ICD-10-CM | POA: Diagnosis not present

## 2016-04-19 DIAGNOSIS — M1990 Unspecified osteoarthritis, unspecified site: Secondary | ICD-10-CM | POA: Diagnosis not present

## 2016-04-19 DIAGNOSIS — E538 Deficiency of other specified B group vitamins: Secondary | ICD-10-CM | POA: Diagnosis not present

## 2016-04-19 DIAGNOSIS — Z9842 Cataract extraction status, left eye: Secondary | ICD-10-CM | POA: Diagnosis not present

## 2016-04-19 DIAGNOSIS — Z888 Allergy status to other drugs, medicaments and biological substances status: Secondary | ICD-10-CM | POA: Diagnosis not present

## 2016-04-19 DIAGNOSIS — K219 Gastro-esophageal reflux disease without esophagitis: Secondary | ICD-10-CM | POA: Diagnosis present

## 2016-04-19 DIAGNOSIS — E559 Vitamin D deficiency, unspecified: Secondary | ICD-10-CM | POA: Diagnosis present

## 2016-04-19 DIAGNOSIS — I951 Orthostatic hypotension: Secondary | ICD-10-CM | POA: Diagnosis not present

## 2016-04-19 DIAGNOSIS — R634 Abnormal weight loss: Secondary | ICD-10-CM | POA: Diagnosis not present

## 2016-04-19 DIAGNOSIS — R479 Unspecified speech disturbances: Secondary | ICD-10-CM | POA: Diagnosis not present

## 2016-04-19 DIAGNOSIS — E2740 Unspecified adrenocortical insufficiency: Secondary | ICD-10-CM | POA: Diagnosis not present

## 2016-04-19 DIAGNOSIS — R569 Unspecified convulsions: Secondary | ICD-10-CM | POA: Diagnosis present

## 2016-04-19 DIAGNOSIS — L8992 Pressure ulcer of unspecified site, stage 2: Secondary | ICD-10-CM | POA: Diagnosis not present

## 2016-04-19 LAB — COMPREHENSIVE METABOLIC PANEL
ALBUMIN: 3.4 g/dL — AB (ref 3.5–4.7)
ALBUMIN: 3.3 g/dL — AB (ref 3.5–5.0)
ALK PHOS: 46 IU/L (ref 39–117)
ALK PHOS: 43 U/L (ref 38–126)
ALT: 13 IU/L (ref 0–44)
ALT: 19 U/L (ref 17–63)
AST: 17 IU/L (ref 0–40)
AST: 23 U/L (ref 15–41)
Albumin/Globulin Ratio: 1.3 (ref 1.2–2.2)
Anion gap: 8 (ref 5–15)
BILIRUBIN TOTAL: 0.4 mg/dL (ref 0.3–1.2)
BUN / CREAT RATIO: 21 (ref 10–24)
BUN: 29 mg/dL — ABNORMAL HIGH (ref 8–27)
BUN: 26 mg/dL — AB (ref 6–20)
Bilirubin Total: 0.3 mg/dL (ref 0.0–1.2)
CO2: 21 mmol/L (ref 18–29)
CO2: 22 mmol/L (ref 22–32)
CREATININE: 1.36 mg/dL — AB (ref 0.76–1.27)
CREATININE: 1.16 mg/dL (ref 0.61–1.24)
Calcium: 8.8 mg/dL (ref 8.6–10.2)
Calcium: 9 mg/dL (ref 8.9–10.3)
Chloride: 105 mmol/L (ref 96–106)
Chloride: 111 mmol/L (ref 101–111)
GFR calc Af Amer: 53 mL/min/{1.73_m2} — ABNORMAL LOW (ref >59)
GFR calc Af Amer: >60 mL/min (ref >60)
GFR, EST NON AFRICAN AMERICAN: 46 mL/min/{1.73_m2} — AB (ref >59)
GFR, EST NON AFRICAN AMERICAN: 54 mL/min — AB (ref >60)
GLOBULIN, TOTAL: 2.6 g/dL (ref 1.5–4.5)
GLUCOSE: 96 mg/dL (ref 65–99)
Glucose: 96 mg/dL (ref 65–99)
Potassium: 4.5 mmol/L (ref 3.5–5.2)
Potassium: 4.1 mmol/L (ref 3.5–5.1)
SODIUM: 140 mmol/L (ref 134–144)
Sodium: 141 mmol/L (ref 135–145)
TOTAL PROTEIN: 5.9 g/dL — AB (ref 6.5–8.1)
Total Protein: 6 g/dL (ref 6.0–8.5)

## 2016-04-19 LAB — URINALYSIS, ROUTINE W REFLEX MICROSCOPIC
Bilirubin Urine: NEGATIVE
GLUCOSE, UA: NEGATIVE mg/dL
HGB URINE DIPSTICK: NEGATIVE
Ketones, ur: NEGATIVE mg/dL
Leukocytes, UA: NEGATIVE
Nitrite: NEGATIVE
Protein, ur: NEGATIVE mg/dL
SPECIFIC GRAVITY, URINE: 1.024 (ref 1.005–1.030)
pH: 6 (ref 5.0–8.0)

## 2016-04-19 LAB — CBC WITH DIFFERENTIAL/PLATELET
BASOS ABS: 0 10*3/uL (ref 0.0–0.1)
BASOS PCT: 0 %
BASOS: 0 %
Basophils Absolute: 0 10*3/uL (ref 0.0–0.2)
EOS (ABSOLUTE): 0.4 10*3/uL (ref 0.0–0.4)
EOS PCT: 3 %
EOS: 3 %
Eosinophils Absolute: 0.3 10*3/uL (ref 0.0–0.7)
HEMATOCRIT: 34.1 % — AB (ref 37.5–51.0)
HEMATOCRIT: 36.2 % — AB (ref 39.0–52.0)
Hemoglobin: 11.5 g/dL — ABNORMAL LOW (ref 12.6–17.7)
Hemoglobin: 11.9 g/dL — ABNORMAL LOW (ref 13.0–17.0)
Immature Grans (Abs): 0.3 10*3/uL — ABNORMAL HIGH (ref 0.0–0.1)
Immature Granulocytes: 3 %
LYMPHS ABS: 1.3 10*3/uL (ref 0.7–3.1)
LYMPHS PCT: 13 %
Lymphs Abs: 1.3 10*3/uL (ref 0.7–4.0)
Lymphs: 12 %
MCH: 30.9 pg (ref 26.6–33.0)
MCH: 31.4 pg (ref 26.0–34.0)
MCHC: 33.7 g/dL (ref 31.5–35.7)
MCHC: 32.9 g/dL (ref 30.0–36.0)
MCV: 92 fL (ref 79–97)
MCV: 95.5 fL (ref 78.0–100.0)
MONO ABS: 0.8 10*3/uL (ref 0.1–1.0)
MONOS ABS: 0.9 10*3/uL (ref 0.1–0.9)
MONOS PCT: 8 %
Monocytes: 9 %
NEUTROS ABS: 7.8 10*3/uL — AB (ref 1.4–7.0)
Neutro Abs: 7.4 10*3/uL (ref 1.7–7.7)
Neutrophils Relative %: 76 %
Neutrophils: 73 %
PLATELETS: 173 10*3/uL (ref 150–400)
Platelets: 205 10*3/uL (ref 150–379)
RBC: 3.72 x10E6/uL — ABNORMAL LOW (ref 4.14–5.80)
RBC: 3.79 MIL/uL — ABNORMAL LOW (ref 4.22–5.81)
RDW: 15.2 % (ref 12.3–15.4)
RDW: 15.4 % (ref 11.5–15.5)
WBC: 10.8 10*3/uL (ref 3.4–10.8)
WBC: 9.8 10*3/uL (ref 4.0–10.5)

## 2016-04-19 LAB — CORTISOL: Cortisol, Plasma: 1.2 ug/dL

## 2016-04-19 LAB — TSH
TSH: 2.95 u[IU]/mL (ref 0.450–4.500)
TSH: 2.286 u[IU]/mL (ref 0.350–4.500)

## 2016-04-19 LAB — TROPONIN I
Troponin I: <0.03 ng/mL (ref <0.03)
Troponin I: <0.03 ng/mL (ref <0.03)

## 2016-04-19 LAB — CBG MONITORING, ED: Glucose-Capillary: 108 mg/dL — ABNORMAL HIGH (ref 65–99)

## 2016-04-19 LAB — MAGNESIUM: MAGNESIUM: 2.1 mg/dL (ref 1.7–2.4)

## 2016-04-19 LAB — GLUCOSE, CAPILLARY: Glucose-Capillary: 96 mg/dL (ref 65–99)

## 2016-04-19 LAB — MRSA PCR SCREENING: MRSA BY PCR: NEGATIVE

## 2016-04-19 MED ORDER — COSYNTROPIN 0.25 MG IJ SOLR
0.2500 mg | Freq: Once | INTRAMUSCULAR | Status: AC
  Administered 2016-04-20: 0.25 mg via INTRAVENOUS
  Filled 2016-04-19: qty 0.25

## 2016-04-19 MED ORDER — CARBIDOPA-LEVODOPA 25-100 MG PO TABS
1.5000 | ORAL_TABLET | Freq: Three times a day (TID) | ORAL | Status: DC
  Administered 2016-04-19 – 2016-04-22 (×10): 1.5 via ORAL
  Filled 2016-04-19 (×10): qty 2

## 2016-04-19 MED ORDER — SODIUM CHLORIDE 0.9% FLUSH
3.0000 mL | Freq: Two times a day (BID) | INTRAVENOUS | Status: DC
  Administered 2016-04-19 – 2016-04-29 (×20): 3 mL via INTRAVENOUS

## 2016-04-19 MED ORDER — ZINC OXIDE 40 % EX OINT
1.0000 "application " | TOPICAL_OINTMENT | Freq: Two times a day (BID) | CUTANEOUS | Status: DC | PRN
  Filled 2016-04-19: qty 114

## 2016-04-19 MED ORDER — ENOXAPARIN SODIUM 40 MG/0.4ML ~~LOC~~ SOLN
40.0000 mg | SUBCUTANEOUS | Status: DC
  Administered 2016-04-19 – 2016-04-30 (×12): 40 mg via SUBCUTANEOUS
  Filled 2016-04-19 (×12): qty 0.4

## 2016-04-19 MED ORDER — HYDRALAZINE HCL 20 MG/ML IJ SOLN
10.0000 mg | INTRAMUSCULAR | Status: DC | PRN
  Administered 2016-04-19 – 2016-04-24 (×4): 10 mg via INTRAVENOUS
  Filled 2016-04-19 (×3): qty 1

## 2016-04-19 MED ORDER — SODIUM CHLORIDE 0.9 % IV SOLN
INTRAVENOUS | Status: DC
  Administered 2016-04-19: 15:00:00 via INTRAVENOUS
  Administered 2016-04-19: 100 mL/h via INTRAVENOUS

## 2016-04-19 MED ORDER — MEGESTROL ACETATE 400 MG/10ML PO SUSP
800.0000 mg | Freq: Every day | ORAL | Status: DC
Start: 2016-04-19 — End: 2016-04-30
  Administered 2016-04-19 – 2016-04-30 (×12): 800 mg via ORAL
  Filled 2016-04-19 (×12): qty 20

## 2016-04-19 MED ORDER — MIRTAZAPINE 15 MG PO TABS
30.0000 mg | ORAL_TABLET | Freq: Every day | ORAL | Status: DC
  Administered 2016-04-19 – 2016-04-20 (×2): 30 mg via ORAL
  Filled 2016-04-19 (×2): qty 2

## 2016-04-19 MED ORDER — HYDRALAZINE HCL 20 MG/ML IJ SOLN
INTRAMUSCULAR | Status: AC
  Filled 2016-04-19: qty 1

## 2016-04-19 MED ORDER — LACTULOSE 10 GM/15ML PO SOLN
40.0000 g | Freq: Every day | ORAL | Status: DC | PRN
  Administered 2016-04-19: 40 g via ORAL
  Filled 2016-04-19: qty 60

## 2016-04-19 MED ORDER — ADULT MULTIVITAMIN W/MINERALS CH
1.0000 | ORAL_TABLET | Freq: Every day | ORAL | Status: DC
  Administered 2016-04-19 – 2016-04-30 (×12): 1 via ORAL
  Filled 2016-04-19 (×13): qty 1

## 2016-04-19 MED ORDER — VITAMIN B-12 1000 MCG PO TABS
1000.0000 ug | ORAL_TABLET | Freq: Every day | ORAL | Status: DC
  Administered 2016-04-19 – 2016-04-30 (×12): 1000 ug via ORAL
  Filled 2016-04-19 (×12): qty 1

## 2016-04-19 MED ORDER — FAMOTIDINE 20 MG PO TABS
20.0000 mg | ORAL_TABLET | Freq: Every day | ORAL | Status: DC
Start: 2016-04-19 — End: 2016-04-30
  Administered 2016-04-19 – 2016-04-30 (×12): 20 mg via ORAL
  Filled 2016-04-19 (×12): qty 1

## 2016-04-19 MED ORDER — MUPIROCIN 2 % EX OINT
1.0000 "application " | TOPICAL_OINTMENT | Freq: Two times a day (BID) | CUTANEOUS | Status: DC
  Administered 2016-04-19 – 2016-04-30 (×23): 1 via NASAL
  Filled 2016-04-19 (×6): qty 22

## 2016-04-19 MED ORDER — PRO-STAT SUGAR FREE PO LIQD
30.0000 mL | Freq: Two times a day (BID) | ORAL | Status: DC
  Administered 2016-04-19 – 2016-04-30 (×23): 30 mL via ORAL
  Filled 2016-04-19 (×24): qty 30

## 2016-04-19 MED ORDER — PSYLLIUM 95 % PO PACK
1.0000 | PACK | Freq: Every day | ORAL | Status: DC
  Administered 2016-04-19 – 2016-04-21 (×3): 1 via ORAL
  Filled 2016-04-19 (×3): qty 1

## 2016-04-19 MED ORDER — SODIUM CHLORIDE 0.9 % IV BOLUS (SEPSIS)
500.0000 mL | Freq: Once | INTRAVENOUS | Status: AC
  Administered 2016-04-19: 500 mL via INTRAVENOUS

## 2016-04-19 MED ORDER — POLYETHYLENE GLYCOL 3350 17 G PO PACK
17.0000 g | PACK | Freq: Every day | ORAL | Status: DC | PRN
  Administered 2016-04-21: 17 g via ORAL
  Filled 2016-04-19: qty 1

## 2016-04-19 NOTE — Progress Notes (Addendum)
Asked to review chart per House Supervisor due to concern over Patient admit to 5W tele floor. 5W concerned about elevated BP however has been orthostatic since office visit yesterday. Hydralazine ordered, 10 mg given IVPin ED. Pt found resting in bed asleep. [place] to admit Patient shortly. Advised [place] to notify Provider and myself if BP increased after transfer.

## 2016-04-19 NOTE — ED Provider Notes (Signed)
[facility] DEPT Provider Note   CSN: [id number] Arrival date & time: 04/18/16  2210  By signing my name below, I, Arianna Nassar, attest that this documentation has been prepared under the direction and in the presence of Orpah Greek, MD.  Electronically Signed: Julien Nordmann, ED Scribe. 04/19/16. 12:45 AM.    History   Chief Complaint Chief Complaint  Patient presents with  . Seizures    The history is provided by the patient. No language interpreter was used.   HPI Comments: Lawrence Turner is a 82 y.o. male who has a PMhx of peripheral neuropathy, hypercholesterolemia, GERD, parkinson's disease, and HTN presents to the Emergency Department complaining of sudden onset, multiple seizure like activities onset about 2 weeks ago. Son states pt was at his neurology office (seen for parkinson's disease and neuropathy) where he had about 3 episodes of seizure like activities. Son notes pt would have episodes of being short of breath and low blood pressure before each seizure like activity occurred. Pt's pressure lowers whenever he goes from sitting to standing. Per son, pt has been having seizure like episodes recently. He says he witnessed one personally where the pt had his mouth open, lip pursed, and was unresponsive when trying to be aroused. Son states pt is currently staying at nursing home but he did not feel comfortable leaving him there overnight with the progression of his activity. He denies chest pain.  Past Medical History:  Diagnosis Date  . Abnormality of gait 10/19/2015  . Anemia, unspecified   . Benign neoplasm of colon    pre-cancerous polyps removed at colonoscopy 2008  . Contact dermatitis and other eczema, due to unspecified cause   . Diverticulosis of colon (without mention of hemorrhage)   . Esophageal reflux   . Hypertrophy of prostate without urinary obstruction and other lower urinary tract symptoms (LUTS)   . Mononeuritis of unspecified site   .  Orthostatic hypotension 04/18/2016  . Osteoarthrosis, unspecified whether generalized or localized, pelvic region and thigh   . Other B-complex deficiencies   . Other pulmonary embolism and infarction    following cholecystectomy 2003  . Parkinson disease ([place]) 10/19/2015  . Peripheral neuropathy ([place])   . Pure hypercholesterolemia   . Rosacea   . Seizures ([place]) 04/17/2016  . 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 Syncope and collapse 04/18/2016  . Ulcer of foot ([place]) 04/03/2016  . Unspecified constipation   . Unspecified essential hypertension   . Unspecified gastritis and gastroduodenitis without mention of hemorrhage 08/29/2006  . Unspecified hemorrhoids without mention of complication   . Unspecified vitamin D deficiency     Patient Active Problem List   Diagnosis Date Noted  . Syncope 04/19/2016  . Syncope and collapse 04/18/2016  . Orthostatic hypotension 04/18/2016  . Seizures ([place]) 04/17/2016  . Ulcer of foot ([place]) 04/03/2016  . Weight loss 01/24/2016  . Diarrhea 01/24/2016  . Abdominal pain 01/24/2016  . Hammer toe of left foot 10/25/2015  . Decubitus ulcer of sacral region, stage 1 10/25/2015  . Abnormality of gait 10/19/2015  . Fall 08/23/2015  . Weak 08/18/2015  . Pain in left hip 08/09/2015  . Edema 03/15/2015  . Constipation 10/05/2014  . Corn 10/05/2014  . Dysphagia 07/29/2014  . Parkinson's disease ([place]) 02/02/2014  . Essential hypertension   . Esophageal reflux   . Peripheral neuropathy ([place])   . Hypertrophy of prostate without urinary obstruction and other lower urinary tract symptoms (LUTS)   .  Other B-complex deficiencies   . Anemia   . Unspecified vitamin D deficiency   . Pure hypercholesterolemia   . Benign neoplasm of colon     Past Surgical History:  Procedure Laterality Date  . CATARACT EXTRACTION W/ INTRAOCULAR LENS IMPLANT Left 5/20//2013  . CHOLECYSTECTOMY, LAPAROSCOPIC  2003  . colonic polyp removal  1965  .  COLONOSCOPY  08/29/2006   hemorrhoids, 3 small polyps (hyperplastic) & diverticulosis  . [place] Medications    Prior to Admission medications   Medication Sig Start Date End Date Taking? Authorizing Provider  acetaminophen (TYLENOL) 325 MG tablet Take 650 mg by mouth every 4 (four) hours as needed for mild pain.    Yes Historical Provider, MD  Amino Acids-Protein Hydrolys (FEEDING SUPPLEMENT, PRO-STAT SUGAR FREE 64,) LIQD Take 30 mLs by mouth 2 (two) times daily. 30 ml twice daily to promote wound healing    Yes Historical Provider, MD  carbidopa-levodopa (SINEMET IR) 25-100 MG tablet Take 1.5 tablets by mouth 3 (three) times daily. 12/26/15  Yes Kathrynn Ducking, MD  lactulose [facility]) 10 GM/15ML solution Take 40 g by mouth daily as needed for mild constipation. Take 60 ml as needed for constipation    Yes Historical Provider, MD  megestrol (MEGACE) 40 MG/ML suspension Take 81ml daily to help stimulate appetite 03/20/16  Yes Historical Provider, MD  mirtazapine (REMERON) 15 MG tablet Take 30 mg by mouth at bedtime.    Yes Historical Provider, MD  Multiple Vitamin (MULTIVITAMIN) tablet Take 1 tablet by mouth daily.   Yes Historical Provider, MD  Nutritional Supplements (BOOST PLUS PO) Take 1 Can by mouth 3 (three) times daily. One can three times a day with meals    Yes Historical Provider, MD  polyethylene glycol (MIRALAX / GLYCOLAX) packet Take 17 g by mouth daily as needed for mild constipation.   Yes Historical Provider, MD  psyllium (METAMUCIL) 58.6 % powder Take 1 packet by mouth daily. One tablespoon in 4 oz liquid daily    Yes Historical Provider, MD  ranitidine (ZANTAC) 150 MG tablet Take 150 mg by mouth at bedtime.  01/31/16  Yes Historical Provider, MD  vitamin B-12 (CYANOCOBALAMIN) 1000 MCG tablet Take 1,000 mcg by mouth daily.   Yes Historical Provider, MD  zinc oxide 20 % ointment Apply 1 application topically 2 (two) times daily as needed for  irritation.   Yes Historical Provider, MD  mupirocin ointment (BACTROBAN) 2 % Place 1 application into the nose. Apply to left 5th toe ulcer after soaking    Historical Provider, MD    Family History Family History  Problem Relation Age of Onset  . Cancer Mother     breast  . Pneumonia Father   . Cancer Brother     Social History Social History  Substance Use Topics  . Smoking status: Former Smoker    Years: 15.00    Types: Cigarettes    Quit date: 01/30/1963  . Smokeless tobacco: Never Used  . Alcohol use 1.2 oz/week    1 Glasses of wine, 1 Cans of beer per week     Comment: 14 per week     Allergies   Lyrica [pregabalin]   Review of Systems Review of Systems  Respiratory: Positive for shortness of breath.   Cardiovascular: Negative for chest pain.  Neurological: Positive for syncope.  All other systems reviewed and are negative.    Physical Exam Updated Vital Signs BP  197/91   Pulse (!) 58   Temp 98.2 F (36.8 C)   Resp 15   SpO2 99%   Physical Exam  Constitutional: He is oriented to person, place, and time. He appears well-developed and well-nourished. No distress.  HENT:  Head: Normocephalic and atraumatic.  Right Ear: Hearing normal.  Left Ear: Hearing normal.  Nose: Nose normal.  Mouth/Throat: Oropharynx is clear and moist and mucous membranes are normal.  Eyes: Conjunctivae and EOM are normal. Pupils are equal, round, and reactive to light.  Neck: Normal range of motion. Neck supple.  Cardiovascular: Regular rhythm, S1 normal and S2 normal.  Exam reveals no gallop and no friction rub.   No murmur heard. Pulmonary/Chest: Effort normal and breath sounds normal. No respiratory distress. He exhibits no tenderness.  Abdominal: Soft. Normal appearance and bowel sounds are normal. There is no hepatosplenomegaly. There is no tenderness. There is no rebound, no guarding, no tenderness at McBurney's point and negative Murphy's sign. No hernia.    Musculoskeletal: Normal range of motion.  Neurological: He is alert and oriented to person, place, and time. He has normal strength. No cranial nerve deficit or sensory deficit. Coordination normal. GCS eye subscore is 4. GCS verbal subscore is 5. GCS motor subscore is 6.  Mask like face with speech not being able to executed well.  Skin: Skin is warm, dry and intact. No rash noted. No cyanosis.  Psychiatric: He has a normal mood and affect. His speech is normal and behavior is normal. Thought content normal.  Nursing note and vitals reviewed.    ED Treatments / Results  DIAGNOSTIC STUDIES: Oxygen Saturation is 97% on RA, normal by my interpretation.  COORDINATION OF CARE:  12:41 AM Discussed treatment plan with pt at bedside and pt agreed to plan.  Labs (all labs ordered are listed, but only abnormal results are displayed) Labs Reviewed  CBC - Abnormal; Notable for the following:       Result Value   RBC 3.87 (*)    Hemoglobin 12.3 (*)    HCT 37.2 (*)    All other components within normal limits  COMPREHENSIVE METABOLIC PANEL - Abnormal; Notable for the following:    CO2 20 (*)    Glucose, Bld 133 (*)    BUN 28 (*)    Creatinine, Ser 1.26 (*)    Total Protein 6.1 (*)    Albumin 3.3 (*)    ALT 9 (*)    GFR calc non Af Amer 49 (*)    GFR calc Af Amer 57 (*)    All other components within normal limits  CBG MONITORING, ED - Abnormal; Notable for the following:    Glucose-Capillary 108 (*)    All other components within normal limits  URINALYSIS, ROUTINE W REFLEX MICROSCOPIC (NOT AT [facility])    EKG  EKG Interpretation  Date/Time:  Wednesday April 18 2016 22:26:23 EDT Ventricular Rate:  89 PR Interval:  164 QRS Duration: 84 QT Interval:  360 QTC Calculation: 438 R Axis:   -3 Text Interpretation:  Normal sinus rhythm Normal ECG Confirmed by Betsey Holiday  MD, Mckyla Deckman [phone]) on 04/19/2016 12:49:46 AM       Radiology Dg Chest 2 View  Result Date: 04/18/2016 CLINICAL  DATA:  Syncope.  Weakness and dyspnea tonight. EXAM: CHEST  2 VIEW COMPARISON:  None. FINDINGS: The lungs are clear. The pulmonary vasculature is normal. Heart size is normal. Hilar and mediastinal contours are unremarkable. There is no pleural effusion. IMPRESSION: No  active cardiopulmonary disease. Electronically Signed   By: Andreas Newport M.D.   On: 04/18/2016 22:50    Procedures Procedures (including critical care time)  Medications Ordered in ED Medications  sodium chloride 0.9 % bolus 500 mL (500 mLs Intravenous New Bag/Given 04/19/16 0203)     Initial Impression / Assessment and Plan / ED Course  I have reviewed the triage vital signs and the nursing notes.  Pertinent labs & imaging results that were available during my care of the patient were reviewed by me and considered in my medical decision making (see chart for details).  Clinical Course  Patient with history of Parkinson's, treated by Dr. Jannifer Franklin of neurology presents with syncope and staring spells. This has been ongoing over period of 2 weeks. He had an episode at Dr. Tobey Grim office today where he had a 50 point drop in his systolic blood pressure. It was felt that his collapses are likely secondary to autonomic instability and orthostatic hypotension. Cannot rule out seizure, however. Patient is mildly orthostatic here in the ER and does have an elevation of BUN/creatinine. He was initiated on IV fluids. Discussion with Dr. Leonel Ramsay, neurology recommended EEG. He did not feel that the patient will require formal neurology consultation at this time unless EEG is normal, as he was seen today by neurology already.  Final Clinical Impressions(s) / ED Diagnoses   Final diagnoses:  Syncope, unspecified syncope type  Orthostatic hypotension   I personally performed the services described in this documentation, which was scribed in my presence. The recorded information has been reviewed and is accurate.   New  Prescriptions New Prescriptions   No medications on file     Orpah Greek, MD 04/19/16 [phone]

## 2016-04-19 NOTE — Procedures (Signed)
ELECTROENCEPHALOGRAM REPORT  Date of Study: 04/19/2016  Patient's Name: Lennis Caldeira MRN: [id number] Date of Birth: 11/27/1925  Referring Provider: Dr. Gean Birchwood  Clinical History: This is an 82 year old man with staring spells and loss of consciousness.   Medications: carbidopa-levodopa (SINEMET IR) 25-100 MG per tablet immediate release 1.5 tablet  famotidine (PEPCID) tablet 20 mg  hydrALAZINE (APRESOLINE) injection 10 mg  lactulose (CHRONULAC) 10 GM/15ML solution 40 g  liver oil-zinc oxide (DESITIN) 40 % ointment 1 application  megestrol (MEGACE) 400 MG/10ML suspension 800 mg  mirtazapine (REMERON) tablet 30 mg  multivitamin with minerals tablet 1 tablet  vitamin B-12 (CYANOCOBALAMIN) tablet 1,000 mcg   Technical Summary: A multichannel digital EEG recording measured by the international 10-20 system with electrodes applied with paste and impedances below 5000 ohms performed in our laboratory with EKG monitoring in an awake and asleep patient.  Hyperventilation and photic stimulation were not performed.  The digital EEG was referentially recorded, reformatted, and digitally filtered in a variety of bipolar and referential montages for optimal display.    Description: The patient is awake and asleep during the recording.  During maximal wakefulness, there is a symmetric, medium voltage 8 Hz posterior dominant rhythm that attenuates with eye opening.  The record is symmetric.  During drowsiness and stage I sleep, there is an increase in theta slowing of the background with vertex waves seen.  Hyperventilation and photic stimulation were not performed.  There were no epileptiform discharges or electrographic seizures seen.    EKG lead showed occasional extrasystolic beats.  Impression: This awake and asleep EEG is normal.    Clinical Correlation: A normal EEG does not exclude a clinical diagnosis of epilepsy. Clinical correlation is advised.   Ellouise Newer, M.D.

## 2016-04-19 NOTE — Evaluation (Signed)
Physical Therapy Evaluation Patient Details Name: Lawrence Turner MRN: [id number] DOB: 1925/09/28 Today's Date: 04/19/2016   History of Present Illness   Lawrence Turner is a 82 y.o. male with Parkinson's disease, hypertension has been experiencing episodes of staring spells and loss of consciousness  Work up includes orthostatics and tachycardia.  Clinical Impression  Pt admitted with/for above problem with LOC with orthostatics/BP being primary work up..  Pt currently limited functionally due to the problems listed below.  (see problems list.)  Pt will benefit from PT to maximize function and safety to be able to get home safely with available assist.     Follow Up Recommendations Home health PT    Equipment Recommendations  None recommended by PT    Recommendations for Other Services       Precautions / Restrictions Precautions Precautions: Fall      Mobility  Bed Mobility Overal bed mobility: Needs Assistance Bed Mobility: Supine to Sit     Supine to sit: Min assist     General bed mobility comments: pt needed assist to come up from supine  Transfers Overall transfer level: Needs assistance Equipment used: Rolling walker (2 wheeled) Transfers: Sit to/from Stand Sit to Stand: Min assist;Mod assist (mod from lower surface)         General transfer comment: cues for hand placement  Ambulation/Gait Ambulation/Gait assistance: Min assist;Min guard Ambulation Distance (Feet): 350 Feet Assistive device: Rolling walker (2 wheeled) Gait Pattern/deviations: Step-through pattern (mildly shuffled and festinating) Gait velocity: slow to moderate. Gait velocity interpretation: Below normal speed for age/gender General Gait Details: initially, mildly unsteady, with shuffling and mild festination, but became more steady with improved quality.  Episodes of min guard toward end of session.  Stairs            Wheelchair Mobility    Modified Rankin (Stroke Patients Only)        Balance Overall balance assessment: Needs assistance Sitting-balance support: No upper extremity supported;Single extremity supported Sitting balance-Leahy Scale: Fair       Standing balance-Leahy Scale: Fair Standing balance comment: based on standing for hygiene not use of RW                             Pertinent Vitals/Pain Pain Assessment: No/denies pain    Home Living Family/patient expects to be discharged to:: Assisted living               Home Equipment: Walker - 2 wheels Additional Comments: pt reports doing most of his ADL's    Prior Function Level of Independence: Independent with assistive device(s) (in supervised setting)               Hand Dominance        Extremity/Trunk Assessment   Upper Extremity Assessment: Defer to OT evaluation           Lower Extremity Assessment: Overall WFL for tasks assessed;Generalized weakness (proximal weakness)         Communication   Communication: No difficulties  Cognition Arousal/Alertness: Awake/alert Behavior During Therapy: Flat affect Overall Cognitive Status: Within Functional Limits for tasks assessed                      General Comments General comments (skin integrity, edema, etc.): States [facility] does assist some people more than him    Exercises        Assessment/Plan    PT Assessment  Patient needs continued PT services  PT Diagnosis Abnormality of gait;Generalized weakness   PT Problem List Decreased strength;Decreased activity tolerance;Decreased balance;Decreased mobility;Decreased coordination;Decreased knowledge of use of DME  PT Treatment Interventions Gait training;Functional mobility training;Therapeutic activities;Balance training;Patient/family education;DME instruction   PT Goals (Current goals can be found in the Care Plan section) Acute Rehab PT Goals Patient Stated Goal: back able to do for myself PT Goal Formulation: With  patient Time For Goal Achievement: 04/26/16 Potential to Achieve Goals: Good    Frequency Min 3X/week   Barriers to discharge        Co-evaluation               End of Session   Activity Tolerance: Patient tolerated treatment well Patient left: in chair;with call bell/phone within reach;with chair alarm set (post going to the bathroom) Nurse Communication: Mobility status    Functional Assessment Tool Used: clinical judgement Functional Limitation: Mobility: Walking and moving around Mobility: Walking and Moving Around Current Status [id number]): At least 1 percent but less than 20 percent impaired, limited or restricted Mobility: Walking and Moving Around Goal Status [phone]): At least 1 percent but less than 20 percent impaired, limited or restricted    Time: [id number] PT Time Calculation (min) (ACUTE ONLY): 33 min   Charges:   PT Evaluation $PT Eval Moderate Complexity: 1 Procedure PT Treatments $Gait Training: 8-22 mins   PT G Codes:   PT G-Codes **NOT FOR INPATIENT CLASS** Functional Assessment Tool Used: clinical judgement Functional Limitation: Mobility: Walking and moving around Mobility: Walking and Moving Around Current Status [id number]): At least 1 percent but less than 20 percent impaired, limited or restricted Mobility: Walking and Moving Around Goal Status [phone]): At least 1 percent but less than 20 percent impaired, limited or restricted    Zamiyah Resendes, Tessie Fass 04/19/2016, 5:54 PM 04/19/2016  Donnella Sham, PT [phone]  (pager)

## 2016-04-19 NOTE — Progress Notes (Addendum)
Spoke with [place] ED RN getting report she stated that vital were Normal and pt was in Sinus Rhythm  I inquired about elevated bp 197/91 HR 119 her response what'swrong with that pt has hypertension. I asked if MD was aware she stated he was in room while cycling bp. Spoke with Agricultural consultant and informed her of patient vital signs.

## 2016-04-19 NOTE — Telephone Encounter (Signed)
I called patient. I talk with the son. The blood work shows chronic renal insufficiency, slightly worse from prior studies. The patient has a mild anemia that is improving over time. Thyroid profile was unremarkable.

## 2016-04-19 NOTE — Telephone Encounter (Signed)
Son [name] [phone] called to advise, father was admitted to [facility] last night for SOB and BP crashing. Left patient's phone number at [facility] [phone].

## 2016-04-19 NOTE — Progress Notes (Signed)
EEG completed, results pending. 

## 2016-04-19 NOTE — Care Management Note (Addendum)
Case Management Note  Patient Details  Name: Damarrius Kulas MRN: [id number] Date of Birth: 1926/03/09  Subjective/Objective:                 Patient in obs with syncope from [facility], long term care resident. CSW consulting.   Action/Plan: Addendum DC to SNF 04-30-16  Expected Discharge Date:                  Expected Discharge Plan:  Assisted Living / [place] (LTC resident at [facility])  In-House Referral:  Clinical Social Work  Discharge planning Services  CM Consult  Post Acute Care Choice:    Choice offered to:     DME Arranged:    DME Agency:     HH Arranged:    [place] Agency:     Status of Service:  In process, will continue to follow  If discussed at Long Length of Stay Meetings, dates discussed:    Additional Comments:  Carles Collet, RN 04/19/2016, 3:26 PM

## 2016-04-19 NOTE — Care Management Obs Status (Signed)
[facility] NOTIFICATION   Patient Details  Name: Lawrence Turner MRN: [id number] Date of Birth: 01-16-1926   Medicare Observation Status Notification Given:  Yes    Carles Collet, RN 04/19/2016, 1:18 PM

## 2016-04-19 NOTE — Clinical Social Work Note (Signed)
Clinical Social Work Assessment  Patient Details  Name: Lawrence Turner MRN: [id number] Date of Birth: 03/27/1926  Date of referral:  04/19/16               Reason for consult:  Facility Placement                Permission sought to share information with:  Family Supports Permission granted to share information::     Name::     Product/process development scientist::  [facility]  Relationship::  son  Contact Information:     Housing/Transportation Living arrangements for the past 2 months:  [place] of Information:  Adult Children Patient Interpreter Needed:  None Criminal Activity/Legal Involvement Pertinent to Current Situation/Hospitalization:  No - Comment as needed Significant Relationships:  Adult Children, Spouse Lives with:  Facility Resident Do you feel safe going back to the place where you live?  Yes Need for family participation in patient care:  Yes (Comment) (decision making)  Care giving concerns:  None- pt is LTC resident at [facility]   Facilities manager / plan:  CSW spoke with pt son about plan for pt when ready for DC from hospital- pt son confirms that pt is from [facility] in ALF portion and that plan will be for him to return there when stable to leave the hospital.  Employment status:  Retired Forensic scientist:  Medicare PT Recommendations:  Not assessed at this time Information / Referral to community resources:  None  Patient/Family's Response to care: Pt is agreeable to pt return to ALF when stable for DC  Patient/Family's Understanding of and Emotional Response to Diagnosis, Current Treatment, and Prognosis: Son is concerned with pt progress and requests update on testing here in the hospital- provided pt son with RN station number to discuss pt further with RN.  Emotional Assessment Appearance:  Appears stated age Attitude/Demeanor/Rapport:  Unable to Assess Affect (typically observed):  Unable to Assess Orientation:  Oriented to  Self Alcohol / Substance use:  Not Applicable Psych involvement (Current and /or in the community):  No (Comment)  Discharge Needs  Concerns to be addressed:  Care Coordination Readmission within the last 30 days:  No Current discharge risk:  None Barriers to Discharge:  Continued Medical Work up   Lawrence Ny, LCSW 04/19/2016, 2:21 PM

## 2016-04-19 NOTE — Progress Notes (Signed)
[facility] informed of pt bp and HR. Arthor Captain LPN

## 2016-04-19 NOTE — ED Notes (Signed)
Everlena Cooper RN aware, Hal Hope MD aware, House Coverage aware 5W not taking pt secondary to BP. Hal Hope MD states order will not be placed for stepdown, HTN meds will not be placed b/c pt becomes orthostatic upon standing. House Coverage to call RR and communicate with MD/5W

## 2016-04-19 NOTE — H&P (Signed)
History and Physical    Lawrence Turner [id number] DOB: 1926/06/09 DOA: 04/18/2016  PCP: Jeanmarie Hubert, MD  Patient coming from: [facility] facility.  Chief Complaint: Loss of consciousness.  HPI: Lawrence Turner is a 82 y.o. male with Parkinson's disease, hypertension has been experiencing episodes of staring spells and loss of consciousness. Patient had followed up with Dr. Jannifer Franklin neurologist yesterday for possible seizure and had a similar episode at the neurologist office where patient had a sudden loss of consciousness and was found to have blood pressure systolic in the Q000111Q. Dr. Jannifer Franklin was planning outpatient workup with 2-D echo and x-rays. Patient also has recently noticed to have weight loss. Since patient's son was concerned patient was brought to the ER. In the ER, patient was noticed to have blood pressure drop and tachycardic on standing. EKG shows normal sinus rhythm with QTC of 438 ms. Patient's blood pressure otherwise is elevated. Patient denies any chest pain, shortness of breath nausea vomiting diarrhea fever or chills. He had a physician had discussed with on-call neurologist Dr. Leonel Ramsay who advised to get EEG in addition to the 2-D echo. On my exam at this time patient is alert awake oriented and following commands. Moving all extremities.  During the episodes it was noticed that patient has staring spells and gets short of breath. Patient does not know that he is going to the spells but recalls coming out of it. Patient had at least 4 episodes so far in 2 weeks including the one in the neurologist office yesterday.   ED Course: Patient was found to be orthostatic. Labs revealed acute renal failure.  Review of Systems: As per HPI, rest all negative.   Past Medical History:  Diagnosis Date  . Abnormality of gait 10/19/2015  . Anemia, unspecified   . Benign neoplasm of colon    pre-cancerous polyps removed at colonoscopy 2008  . Contact dermatitis and other eczema,  due to unspecified cause   . Diverticulosis of colon (without mention of hemorrhage)   . Esophageal reflux   . Hypertrophy of prostate without urinary obstruction and other lower urinary tract symptoms (LUTS)   . Mononeuritis of unspecified site   . Orthostatic hypotension 04/18/2016  . Osteoarthrosis, unspecified whether generalized or localized, pelvic region and thigh   . Other B-complex deficiencies   . Other pulmonary embolism and infarction    following cholecystectomy 2003  . Parkinson disease ([place]) 10/19/2015  . Peripheral neuropathy ([place])   . Pure hypercholesterolemia   . Rosacea   . Seizures ([place]) 04/17/2016  . 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 Syncope and collapse 04/18/2016  . Ulcer of foot ([place]) 04/03/2016  . Unspecified constipation   . Unspecified essential hypertension   . Unspecified gastritis and gastroduodenitis without mention of hemorrhage 08/29/2006  . Unspecified hemorrhoids without mention of complication   . Unspecified vitamin D deficiency     Past Surgical History:  Procedure Laterality Date  . CATARACT EXTRACTION W/ INTRAOCULAR LENS IMPLANT Left 5/20//2013  . CHOLECYSTECTOMY, LAPAROSCOPIC  2003  . colonic polyp removal  1965  . COLONOSCOPY  08/29/2006   hemorrhoids, 3 small polyps (hyperplastic) & diverticulosis  . [place]     reports that he quit smoking about 53 years ago. His smoking use included Cigarettes. He quit after 15.00 years of use. He has never used smokeless tobacco. He reports that he drinks about 1.2 oz of alcohol per week . He reports that he  does not use drugs.  Allergies  Allergen Reactions  . Lyrica [Pregabalin]     *Anticonvulsants*, fatique    Family History  Problem Relation Age of Onset  . Cancer Mother     breast  . Pneumonia Father   . Cancer Brother     Prior to Admission medications   Medication Sig Start Date End Date Taking? Authorizing Provider  acetaminophen  (TYLENOL) 325 MG tablet Take 650 mg by mouth every 4 (four) hours as needed for mild pain.    Yes Historical Provider, MD  Amino Acids-Protein Hydrolys (FEEDING SUPPLEMENT, PRO-STAT SUGAR FREE 64,) LIQD Take 30 mLs by mouth 2 (two) times daily. 30 ml twice daily to promote wound healing    Yes Historical Provider, MD  carbidopa-levodopa (SINEMET IR) 25-100 MG tablet Take 1.5 tablets by mouth 3 (three) times daily. 12/26/15  Yes Kathrynn Ducking, MD  lactulose [facility]) 10 GM/15ML solution Take 40 g by mouth daily as needed for mild constipation. Take 60 ml as needed for constipation    Yes Historical Provider, MD  megestrol (MEGACE) 40 MG/ML suspension Take 76ml daily to help stimulate appetite 03/20/16  Yes Historical Provider, MD  mirtazapine (REMERON) 15 MG tablet Take 30 mg by mouth at bedtime.    Yes Historical Provider, MD  Multiple Vitamin (MULTIVITAMIN) tablet Take 1 tablet by mouth daily.   Yes Historical Provider, MD  Nutritional Supplements (BOOST PLUS PO) Take 1 Can by mouth 3 (three) times daily. One can three times a day with meals    Yes Historical Provider, MD  polyethylene glycol (MIRALAX / GLYCOLAX) packet Take 17 g by mouth daily as needed for mild constipation.   Yes Historical Provider, MD  psyllium (METAMUCIL) 58.6 % powder Take 1 packet by mouth daily. One tablespoon in 4 oz liquid daily    Yes Historical Provider, MD  ranitidine (ZANTAC) 150 MG tablet Take 150 mg by mouth at bedtime.  01/31/16  Yes Historical Provider, MD  vitamin B-12 (CYANOCOBALAMIN) 1000 MCG tablet Take 1,000 mcg by mouth daily.   Yes Historical Provider, MD  zinc oxide 20 % ointment Apply 1 application topically 2 (two) times daily as needed for irritation.   Yes Historical Provider, MD  mupirocin ointment (BACTROBAN) 2 % Place 1 application into the nose. Apply to left 5th toe ulcer after soaking    Historical Provider, MD    Physical Exam: Vitals:   04/19/16 0100 04/19/16 0130 04/19/16 0200 04/19/16 0255    BP: 158/87 155/98 197/91 178/96  Pulse: 63 65 (!) 58   Resp: 14 10 15    Temp:      SpO2: 100% 100% 99%       Constitutional: Not in distress. Vitals:   04/19/16 0100 04/19/16 0130 04/19/16 0200 04/19/16 0255  BP: 158/87 155/98 197/91 178/96  Pulse: 63 65 (!) 58   Resp: 14 10 15    Temp:      SpO2: 100% 100% 99%    Eyes: Anicteric no pallor. ENMT: No discharge from the ears eyes nose or mouth. Neck: No mass felt. No JVD appreciated. Respiratory: No rhonchi or crepitations. Cardiovascular: S1 and S2 heard. Abdomen: Soft nontender bowel sounds present. No guarding or rigidity. Musculoskeletal: No edema. Skin: No rash.Left foot ulcer. Neurologic: Alert awake oriented to time place and person. Moves all extremities. No facial asymmetry. PERRLA positive. Psychiatric: Appears normal.   Labs on Admission: I have personally reviewed following labs and imaging studies  CBC:  Recent Labs Lab  04/18/16 2232  WBC 10.4  HGB 12.3*  HCT 37.2*  MCV 96.1  PLT 99991111   Basic Metabolic Panel:  Recent Labs Lab 04/18/16 2232  NA 137  K 3.8  CL 109  CO2 20*  GLUCOSE 133*  BUN 28*  CREATININE 1.26*  CALCIUM 9.1   GFR: Estimated Creatinine Clearance: 36.3 mL/min (by C-G formula based on SCr of 1.26 mg/dL). Liver Function Tests:  Recent Labs Lab 04/18/16 2232  AST 24  ALT 9*  ALKPHOS 44  BILITOT 0.3  PROT 6.1*  ALBUMIN 3.3*   No results for input(s): LIPASE, AMYLASE in the last 168 hours. No results for input(s): AMMONIA in the last 168 hours. Coagulation Profile: No results for input(s): INR, PROTIME in the last 168 hours. Cardiac Enzymes: No results for input(s): CKTOTAL, CKMB, CKMBINDEX, TROPONINI in the last 168 hours. BNP (last 3 results) No results for input(s): PROBNP in the last 8760 hours. HbA1C: No results for input(s): HGBA1C in the last 72 hours. CBG:  Recent Labs Lab 04/19/16 0046  GLUCAP 108*   Lipid Profile: No results for input(s): CHOL,  HDL, LDLCALC, TRIG, CHOLHDL, LDLDIRECT in the last 72 hours. Thyroid Function Tests: No results for input(s): TSH, T4TOTAL, FREET4, T3FREE, THYROIDAB in the last 72 hours. Anemia Panel: No results for input(s): VITAMINB12, FOLATE, FERRITIN, TIBC, IRON, RETICCTPCT in the last 72 hours. Urine analysis:    Component Value Date/Time   COLORURINE YELLOW 04/19/2016 0113   APPEARANCEUR CLEAR 04/19/2016 0113   LABSPEC 1.024 04/19/2016 0113   PHURINE 6.0 04/19/2016 0113   GLUCOSEU NEGATIVE 04/19/2016 0113   HGBUR NEGATIVE 04/19/2016 0113   BILIRUBINUR NEGATIVE 04/19/2016 0113   KETONESUR NEGATIVE 04/19/2016 0113   PROTEINUR NEGATIVE 04/19/2016 0113   NITRITE NEGATIVE 04/19/2016 0113   LEUKOCYTESUR NEGATIVE 04/19/2016 0113   Sepsis Labs: @LABRCNTIP (procalcitonin:4,lacticidven:4) )No results found for this or any previous visit (from the past 240 hour(s)).   Radiological Exams on Admission: Dg Chest 2 View  Result Date: 04/18/2016 CLINICAL DATA:  Syncope.  Weakness and dyspnea tonight. EXAM: CHEST  2 VIEW COMPARISON:  None. FINDINGS: The lungs are clear. The pulmonary vasculature is normal. Heart size is normal. Hilar and mediastinal contours are unremarkable. There is no pleural effusion. IMPRESSION: No active cardiopulmonary disease. Electronically Signed   By: Andreas Newport M.D.   On: 04/18/2016 22:50    EKG: Independently reviewed. Normal sinus rhythm with QTC of 438 ms.  Assessment/Plan Principal Problem:   Faintness Active Problems:   Essential hypertension   Parkinson's disease (HCC)   Orthostatic hypotension    1. Syncope - patient is clearly orthostatic which could be contributing to patient's symptoms. However since patient has staring spells will check EEG as recommended by Dr. Leonel Ramsay, neuro hospitalist. Check 2-D echo monitor in telemetry. Physical therapy consult. Check TSH and cortisol levels. Recheck orthostatics in a.m. after hydration. TED hose. Patient has had  MRI of the brain March 2017. 2. Uncontrolled hypertension - reviewing patient's medications patient is not on any antihypertensive at this time. Since patient is actually orthostatic I'm hesitant to start antihypertensives. For now I have placed patient on when necessary IV hydralazine for systolic blood pressure more than 180. Closely follow blood pressure trends and orthostatic changes. 3. Acute renal failure - possibly from poor oral intake. Check UA. Patient is receiving gentle hydration at this time. Closely follow intake output and metabolic panel. 4. Chronic anemia - follow CBC. 5. History of Parkinson's disease - continue present medications Sinemet.  6. Left foot ulcer being followed as outpatient.   DVT prophylaxis: Lovenox. Code Status: DO NOT RESUSCITATE.  Family Communication: Discussed with patient.  Disposition Plan: Independent living facility.  Consults called: None.  Admission status: Observation. Telemetry.    Rise Patience MD [facility] Pager [phone].  If 7PM-7AM, please contact night-coverage [email or web address] Password [email or web address]  04/19/2016, 3:06 AM

## 2016-04-19 NOTE — ED Notes (Signed)
RR currently at bedside. Give 10mg  Hydralazine & send pt upstairs per [employer]

## 2016-04-19 NOTE — Progress Notes (Addendum)
PROGRESS NOTE    Lawrence Turner  [id number] DOB: 08-14-1926 DOA: 04/18/2016 PCP: Jeanmarie Hubert, MD   Brief Narrative:  Lawrence Turner is a 82 y.o. male with Parkinson's disease, hypertension who has been experiencing episodes of staring spells and loss of consciousness. He states this has been going on for 2 weeks. He states that he does not feel the episodes before they happen. He is not sure if he loses consciousness during the episodes but is told by witnesses that he has staring spells, confusion, and sometimes loss of consciousness. He denies as vision loss, focal weakness, loss of control of bladder or bowel, tongue biting. He does not believe he has hit his head. He followed up with his neurologist Dr. Jannifer Franklin in office on 8/30 for evaluation for these episodes. During his appointment, he had another episode where he was observed to be dyspneic, decreased responsiveness, and his orthostatic BP showed a 50 point drop from sitting to standing. Dr. Jannifer Franklin ordered 2D echo as well as CXR. However, patient had another episode at home which prompted his son to bring patient into the ED. ED physician discussed with on-call neurologist Dr. Leonel Ramsay who recommended EEG as well.    Assessment & Plan:   Principal Problem:   Altered level of consciousness Active Problems:   Uncontrolled hypertension   Normocytic anemia   Parkinson's disease (HCC)   Ulcer of foot (HCC)   Orthostatic hypotension  Altered level of consciousness, unclear etiology, syncope vs seizure vs other   Tele showing NSR with PACs, EKG NSR rate 90  EEG - normal   Echo pending    ?adrenal insufficiency  ACTH stim test ordered   AKI, possibly dehydration - resolved. Now at baseline   UA negative  Monitor BMP   Remove foley, may use handheld urinal or bedpan   Uncontrolled HTN with episodes of orthostatic hypotension   Continue to monitor, hydralazine prn for SBP > 180   Chronic normocytic anemia  Monitor  CBC  Parkinson's disease  Follows with Dr. Jannifer Franklin, neurology  Continue Sinemet  Left foot ulcer, present on admission   Clean, non draining on exam  Followed as outpatient   DVT prophylaxis: Lovenox Code Status: DNR Family Communication: Attempted to update family. Left voicemail for son, [name] at [phone] Disposition Plan: Unclear, pending further work up.    Consultants:   None  Procedures:   None   Antimicrobials:   None    Subjective: Patient states that he is tired and weak from not getting much rest since emergency department. He has no specific complaints this morning. He does not have prodromal symptoms prior to these decreased or altered level of consciousness episodes. He admits to some intermittent dizziness and generalized weakness but no focal weakness. He denies any fevers, chills, Cp, SOB, abdominal pain, n/v, change in bowel habits.    Objective: Vitals:   04/19/16 0300 04/19/16 0330 04/19/16 0357 04/19/16 0638  BP: (!) 209/93 175/88 (!) 186/91 (!) 162/109  Pulse: 60 76 98 86  Resp: 11 10 18    Temp:   99 F (37.2 C)   TempSrc:   Oral   SpO2: 100% 99% 94%   Weight:   61.3 kg (135 lb 1.6 oz)   Height:   5' 1.2" (1.554 m)     Intake/Output Summary (Last 24 hours) at 04/19/16 1005 Last data filed at 04/19/16 0700  Gross per 24 hour  Intake  770 ml  Output              100 ml  Net              670 ml   Filed Weights   04/19/16 0357  Weight: 61.3 kg (135 lb 1.6 oz)    Examination:  General exam: Appears calm and comfortable  Respiratory system: Clear to auscultation. Respiratory effort normal. Cardiovascular system: S1 & S2 heard, RRR. No JVD, murmurs, rubs, gallops or clicks. No pedal edema. Gastrointestinal system: Abdomen is nondistended, soft and nontender. No organomegaly or masses felt. Normal bowel sounds heard. Central nervous system: Alert and oriented. CN 2-12 grossly in tact, finger-nose symmetric and appropriate,  answers all questions appropriately, moves all extremities spontaneously  Extremities: Symmetric 5 x 5 power.  Skin: No rashes. Small clean ulcer on plantar surface left 5th metatarsal head - present on admission  Psychiatry: Judgement and insight appear normal. Mood & affect appropriate.   Data Reviewed: I have personally reviewed following labs and imaging studies  CBC:  Recent Labs Lab 04/18/16 1659 04/18/16 2232 04/19/16 0434  WBC 10.8 10.4 9.8  NEUTROABS 7.8*  --  7.4  HGB  --  12.3* 11.9*  HCT 34.1* 37.2* 36.2*  MCV 92 96.1 95.5  PLT 205 191 [id number]   Basic Metabolic Panel:  Recent Labs Lab 04/18/16 1659 04/18/16 2232 04/19/16 0434  NA 140 137 141  K 4.5 3.8 4.1  CL 105 109 111  CO2 21 20* 22  GLUCOSE 96 133* 96  BUN 29* 28* 26*  CREATININE 1.36* 1.26* 1.16  CALCIUM 8.8 9.1 9.0  MG  --   --  2.1   GFR: Estimated Creatinine Clearance: 32.9 mL/min (by C-G formula based on SCr of 1.16 mg/dL). Liver Function Tests:  Recent Labs Lab 04/18/16 1659 04/18/16 2232 04/19/16 0434  AST 17 24 23   ALT 13 9* 19  ALKPHOS 46 44 43  BILITOT 0.3 0.3 0.4  PROT 6.0 6.1* 5.9*  ALBUMIN 3.4* 3.3* 3.3*   No results for input(s): LIPASE, AMYLASE in the last 168 hours. No results for input(s): AMMONIA in the last 168 hours. Coagulation Profile: No results for input(s): INR, PROTIME in the last 168 hours. Cardiac Enzymes:  Recent Labs Lab 04/19/16 0620  TROPONINI <0.03   BNP (last 3 results) No results for input(s): PROBNP in the last 8760 hours. HbA1C: No results for input(s): HGBA1C in the last 72 hours. CBG:  Recent Labs Lab 04/19/16 0046 04/19/16 0739  GLUCAP 108* 96   Lipid Profile: No results for input(s): CHOL, HDL, LDLCALC, TRIG, CHOLHDL, LDLDIRECT in the last 72 hours. Thyroid Function Tests:  Recent Labs  04/19/16 0434  TSH 2.286   Anemia Panel: No results for input(s): VITAMINB12, FOLATE, FERRITIN, TIBC, IRON, RETICCTPCT in the last 72  hours. Sepsis Labs: No results for input(s): PROCALCITON, LATICACIDVEN in the last 168 hours.  Recent Results (from the past 240 hour(s))  MRSA PCR Screening     Status: None   Collection Time: 04/19/16  4:04 AM  Result Value Ref Range Status   MRSA by PCR NEGATIVE NEGATIVE Final    Comment:        The GeneXpert MRSA Assay (FDA approved for NASAL specimens only), is one component of a comprehensive MRSA colonization surveillance program. It is not intended to diagnose MRSA infection nor to guide or monitor treatment for MRSA infections.          Radiology Studies: Dg  Chest 2 View  Result Date: 04/18/2016 CLINICAL DATA:  Syncope.  Weakness and dyspnea tonight. EXAM: CHEST  2 VIEW COMPARISON:  None. FINDINGS: The lungs are clear. The pulmonary vasculature is normal. Heart size is normal. Hilar and mediastinal contours are unremarkable. There is no pleural effusion. IMPRESSION: No active cardiopulmonary disease. Electronically Signed   By: Andreas Newport M.D.   On: 04/18/2016 22:50        Scheduled Meds: . carbidopa-levodopa  1.5 tablet Oral TID  . enoxaparin (LOVENOX) injection  40 mg Subcutaneous Q24H  . famotidine  20 mg Oral Daily  . feeding supplement (PRO-STAT SUGAR FREE 64)  30 mL Oral BID  . megestrol  800 mg Oral Daily  . mirtazapine  30 mg Oral QHS  . multivitamin with minerals  1 tablet Oral Daily  . mupirocin ointment  1 application Nasal BID  . psyllium  1 packet Oral Daily  . sodium chloride flush  3 mL Intravenous Q12H  . vitamin B-12  1,000 mcg Oral Daily   Continuous Infusions: . sodium chloride 100 mL/hr (04/19/16 0418)     LOS: 0 days    Time spent: 40 minutes     Shon Millet, DO [facility] Pager [phone]  If 7PM-7AM, please contact night-coverage [email or web address] Password [email or web address] 04/19/2016, 10:05 AM

## 2016-04-19 NOTE — Progress Notes (Signed)
Call received from patient's son, Hakeim Muse, who is HCPOA and interested in discussing POC as well as diagnostic results.He will be available in person at 3 PM on 04/20/2016.He may be reached via mobile number at [phone].

## 2016-04-19 NOTE — ED Notes (Signed)
EDP at bedside  

## 2016-04-19 NOTE — Telephone Encounter (Signed)
Events noted, I tried to call the room number, unable to reach anyone. The patient has been admitted for evaluation of the near syncopal events. Chest x-ray appears to be unremarkable. EEG apparently will be ordered.

## 2016-04-20 ENCOUNTER — Inpatient Hospital Stay (HOSPITAL_COMMUNITY): Payer: Medicare Other

## 2016-04-20 DIAGNOSIS — D649 Anemia, unspecified: Secondary | ICD-10-CM

## 2016-04-20 DIAGNOSIS — R55 Syncope and collapse: Secondary | ICD-10-CM

## 2016-04-20 DIAGNOSIS — I10 Essential (primary) hypertension: Secondary | ICD-10-CM

## 2016-04-20 DIAGNOSIS — L97521 Non-pressure chronic ulcer of other part of left foot limited to breakdown of skin: Secondary | ICD-10-CM

## 2016-04-20 DIAGNOSIS — I951 Orthostatic hypotension: Secondary | ICD-10-CM

## 2016-04-20 DIAGNOSIS — G20 Parkinson's disease: Secondary | ICD-10-CM

## 2016-04-20 LAB — CBC WITH DIFFERENTIAL/PLATELET
Basophils Absolute: 0 10*3/uL (ref 0.0–0.1)
Basophils Relative: 0 %
EOS ABS: 0.3 10*3/uL (ref 0.0–0.7)
EOS PCT: 3 %
HCT: 33.1 % — ABNORMAL LOW (ref 39.0–52.0)
Hemoglobin: 10.5 g/dL — ABNORMAL LOW (ref 13.0–17.0)
LYMPHS PCT: 12 %
Lymphs Abs: 1 10*3/uL (ref 0.7–4.0)
MCH: 30.6 pg (ref 26.0–34.0)
MCHC: 31.7 g/dL (ref 30.0–36.0)
MCV: 96.5 fL (ref 78.0–100.0)
Monocytes Absolute: 0.8 10*3/uL (ref 0.1–1.0)
Monocytes Relative: 9 %
NEUTROS ABS: 6.9 10*3/uL (ref 1.7–7.7)
NEUTROS PCT: 76 %
PLATELETS: 168 10*3/uL (ref 150–400)
RBC: 3.43 MIL/uL — ABNORMAL LOW (ref 4.22–5.81)
RDW: 15.6 % — AB (ref 11.5–15.5)
WBC: 9.1 10*3/uL (ref 4.0–10.5)

## 2016-04-20 LAB — ECHOCARDIOGRAM COMPLETE
Height: 61.2 in
Weight: 2209.6 oz

## 2016-04-20 LAB — BASIC METABOLIC PANEL
Anion gap: 4 — ABNORMAL LOW (ref 5–15)
BUN: 24 mg/dL — AB (ref 6–20)
CO2: 20 mmol/L — ABNORMAL LOW (ref 22–32)
CREATININE: 0.97 mg/dL (ref 0.61–1.24)
Calcium: 8.4 mg/dL — ABNORMAL LOW (ref 8.9–10.3)
Chloride: 114 mmol/L — ABNORMAL HIGH (ref 101–111)
GFR calc Af Amer: >60 mL/min (ref >60)
Glucose, Bld: 86 mg/dL (ref 65–99)
Potassium: 4.1 mmol/L (ref 3.5–5.1)
SODIUM: 138 mmol/L (ref 135–145)

## 2016-04-20 LAB — ACTH STIMULATION, 3 TIME POINTS
CORTISOL BASE: 1 ug/dL
Cortisol, 30 Min: 3.6 ug/dL
Cortisol, 60 Min: 5.8 ug/dL

## 2016-04-20 LAB — GLUCOSE, CAPILLARY: GLUCOSE-CAPILLARY: 91 mg/dL (ref 65–99)

## 2016-04-20 MED ORDER — HYDROCORTISONE 20 MG PO TABS
20.0000 mg | ORAL_TABLET | Freq: Every morning | ORAL | Status: DC
  Administered 2016-04-20 – 2016-04-30 (×11): 20 mg via ORAL
  Filled 2016-04-20 (×11): qty 1

## 2016-04-20 MED ORDER — HYDROCORTISONE 10 MG PO TABS
10.0000 mg | ORAL_TABLET | Freq: Every day | ORAL | Status: DC
  Administered 2016-04-20 – 2016-04-29 (×10): 10 mg via ORAL
  Filled 2016-04-20 (×11): qty 1

## 2016-04-20 NOTE — Progress Notes (Signed)
Echo results reviewed.   Patient's cortisol is low, ACTH stim test positive for adrenal insufficiency.  I did call and speak with Dr. Elayne Snare, [facility] endocrinology [phone], who recommended Cortef 20mg  in Am, 10mg  in PM and follow up outpatient.   Lawrence Millet, DO [facility] Pager [phone]  If 7PM-7AM, please contact night-coverage [email or web address] Password [email or web address] 04/20/2016, 11:50 AM

## 2016-04-20 NOTE — Progress Notes (Signed)
Initial Nutrition Assessment  DOCUMENTATION CODES:   Not applicable  INTERVENTION:   -Continue 30 ml Prostat BID, each supplement will provide 100 kcals and 15 grams protein -Continue MVI daily  NUTRITION DIAGNOSIS:   Increased nutrient needs related to wound healing as evidenced by estimated needs.  GOAL:   Patient will meet greater than or equal to 90% of their needs  MONITOR:   PO intake, Supplement acceptance, Labs, Weight trends, Skin, I & O's  REASON FOR ASSESSMENT:   Malnutrition Screening Tool    ASSESSMENT:   Lawrence Turner is a 82 y.o. male with Parkinson's disease, hypertension has been experiencing episodes of staring spells and loss of consciousness. Patient had followed up with Dr. Jannifer Franklin neurologist yesterday for possible seizure and had a similar episode at the neurologist office where patient had a sudden loss of consciousness and was found to have blood pressure systolic in the Q000111Q.   Pt admitted with faintness and syncope.   Attempted to examine pt x 3, however, pt receiving nursing care at times of multiple visits. Unable to complete Nutrition-Focused physical exam at this time.   Records from [facility], where pt resided PTA, reviewed. Medications PTA include zinc, Thera-M, vitamin B-12, metamucil, remeron, MVI, and Prostat BID.   Medications reviewed and include MVI, metamucil, and remeron.   Meal completion 75-100%. Will continue with prostat supplement and MVI to promote healing. Pt is accepting supplements.   Per ALF records, wt has been stable.   Labs reviewed.   Diet Order:  Diet Heart Room service appropriate? Yes; Fluid consistency: Thin  Skin:  Wound (see comment) (stage II coccyx)  Last BM:  04/19/16  Height:   Ht Readings from Last 1 Encounters:  04/19/16 5' 1.2" (1.554 m)    Weight:   Wt Readings from Last 1 Encounters:  04/20/16 138 lb 1.6 oz (62.6 kg)    Ideal Body Weight:  50.9 kg  BMI:  Body mass index is  25.92 kg/m.  Estimated Nutritional Needs:   Kcal:  1550-1750  Protein:  60-75 grams  Fluid:  1.5-1.7 L  EDUCATION NEEDS:   No education needs identified at this time  Kadie Balestrieri A. [name], RD, LDN, CDE Pager: [phone] After hours Pager: [phone]

## 2016-04-20 NOTE — Progress Notes (Signed)
Met with son, Clifford, and patient this afternoon. Explained imaging and lab findings and current treatment. All questions answered.    Chahn-Yang , DO [facility] Pager [phone]  If 7PM-7AM, please contact night-coverage [email or web address] Password [email or web address] 04/20/2016, 4:08 PM    

## 2016-04-20 NOTE — Progress Notes (Signed)
Physical Therapy Treatment Patient Details Name: Lawrence Turner MRN: [id number] DOB: 09/28/1925 Today's Date: 04/20/2016    History of Present Illness  Lawrence Turner is a 82 y.o. male with Parkinson's disease, hypertension has been experiencing episodes of staring spells and loss of consciousness  Work up includes orthostatics and tachycardia.    PT Comments    Patient progressing with mobility and endurance.  Still with gait and postural abnormality and increased fall risk. Agree with HHPT at d/c for maximizing safety and independence in his home environment.  Will continue skilled PT in the acute setting to allow d/c home.   Follow Up Recommendations  Home health PT     Equipment Recommendations  None recommended by PT    Recommendations for Other Services       Precautions / Restrictions Precautions Precautions: Fall    Mobility  Bed Mobility Overal bed mobility: Needs Assistance Bed Mobility: Supine to Sit     Supine to sit: Min assist     General bed mobility comments: pt asked for assist to come up from supine  Transfers Overall transfer level: Needs assistance Equipment used: Rolling walker (2 wheeled) Transfers: Sit to/from Stand Sit to Stand: Min assist;Min guard         General transfer comment: increased assist up from recliner, but stood from low toilet on his own with grabbar, up from bed with minguard, increased time  Ambulation/Gait Ambulation/Gait assistance: Min guard;Supervision Ambulation Distance (Feet): 450 Feet Assistive device: Rolling walker (2 wheeled) Gait Pattern/deviations: Step-through pattern;Decreased stride length;Decreased dorsiflexion - left;Trunk flexed;Shuffle     General Gait Details: c/o SOB on last lap, HR WNL   Stairs            Wheelchair Mobility    Modified Rankin (Stroke Patients Only)       Balance Overall balance assessment: Needs assistance   Sitting balance-Leahy Scale: Good     Standing balance  support: No upper extremity supported Standing balance-Leahy Scale: Poor Standing balance comment: needs UE support for safe standing                    Cognition Arousal/Alertness: Awake/alert Behavior During Therapy: Flat affect Overall Cognitive Status: Within Functional Limits for tasks assessed                      Exercises      General Comments        Pertinent Vitals/Pain Pain Assessment: Faces Faces Pain Scale: Hurts little more Pain Location: L foot (neuropathy pain) Pain Descriptors / Indicators: Burning Pain Intervention(s): Monitored during session    Home Living                      Prior Function            PT Goals (current goals can now be found in the care plan section) Progress towards PT goals: Progressing toward goals    Frequency  Min 3X/week    PT Plan Current plan remains appropriate    Co-evaluation             End of Session Equipment Utilized During Treatment: Gait belt Activity Tolerance: Patient limited by fatigue Patient left: in chair;with call bell/phone within reach;with chair alarm set;with family/visitor present     Time: [id number] PT Time Calculation (min) (ACUTE ONLY): 20 min  Charges:  $Gait Training: 8-22 mins  G CodesReginia Naas 04/20/2016, 3:43 PM  Magda Kiel, [place] 04/20/2016

## 2016-04-20 NOTE — Progress Notes (Signed)
PROGRESS NOTE    Lawrence Turner  [id number] DOB: [birth year] DOA: 04/18/2016 PCP: Turner Hubert, MD   Brief Narrative:  Lawrence Turner is a 82 y.o. male with Parkinson's disease, hypertension who has been experiencing episodes of staring spells and loss of consciousness. He states this has been going on for 2 weeks. He states that he does not feel the episodes before they happen. He is not sure if he loses consciousness during the episodes but is told by witnesses that he has staring spells, confusion, and sometimes loss of consciousness. He denies as vision loss, focal weakness, loss of control of bladder or bowel, tongue biting. He does not believe he has hit his head. He followed up with his neurologist Dr. Jannifer Franklin in office on 8/30 for evaluation for these episodes. During his appointment, he had another episode where he was observed to be dyspneic, decreased responsiveness, and his orthostatic BP showed a 50 point drop from sitting to standing. Dr. Jannifer Franklin ordered 2D echo as well as CXR. However, patient had another episode at home which prompted his son to bring patient into the ED. ED physician discussed with on-call neurologist Dr. Leonel Ramsay who recommended EEG as well.    Assessment & Plan:   Principal Problem:   Altered level of consciousness Active Problems:   Uncontrolled hypertension   Normocytic anemia   Parkinson's disease (HCC)   Ulcer of foot (HCC)   Orthostatic hypotension   Syncope  Altered level of consciousness, unclear etiology, syncope vs seizure vs other   Tele showing NSR with PACs, EKG NSR rate 90  EEG - normal   Echo pending    ?adrenal insufficiency  ACTH stim test ordered  AKI, possibly dehydration - resolved. Now at baseline   UA negative  Monitor BMP  Uncontrolled HTN with episodes of orthostatic hypotension   Continue to monitor, hydralazine prn for SBP > 180   Chronic normocytic anemia  Monitor CBC  Parkinson's disease  Follows with Dr. Jannifer Franklin,  neurology  Continue Sinemet  Left foot ulcer, present on admission   Clean, non draining on exam  Followed as outpatient   DVT prophylaxis: Lovenox Code Status: DNR Family Communication: Spoke with [name] at length over the phone today [phone]  Disposition Plan: Unclear, pending further work up.    Consultants:   None  Procedures:   None   Antimicrobials:   None    Subjective: Patient has not had any acute episodes of events overnight. He explains his frustration regarding the observation status paperwork he received today. Otherwise, he states he is feeling okay. No headaches, chest pain, shortness of breath, abdominal pain, nausea, vomiting. Foley was removed yesterday and condom catheter placed.    Objective: Vitals:   04/19/16 1635 04/19/16 1848 04/19/16 2101 04/20/16 0617  BP: (!) 180/85 (!) 90/52 125/74 (!) 184/86  Pulse: 73  96 72  Resp: 19  19 16   Temp: 98.7 F (37.1 C)  98.5 F (36.9 C) 98.2 F (36.8 C)  TempSrc: Oral  Oral Oral  SpO2: 100%  100% 96%  Weight:    62.6 kg (138 lb 1.6 oz)  Height:        Intake/Output Summary (Last 24 hours) at 04/20/16 0906 Last data filed at 04/20/16 [id number]  Gross per 24 hour  Intake             2268 ml  Output             2000 ml  Net  268 ml   Filed Weights   04/19/16 0357 04/20/16 0617  Weight: 61.3 kg (135 lb 1.6 oz) 62.6 kg (138 lb 1.6 oz)    Examination:  General exam: Appears calm and comfortable  Respiratory system: Clear to auscultation. Respiratory effort normal. Cardiovascular system: S1 & S2 heard, RRR. No JVD, murmurs, rubs, gallops or clicks. No pedal edema. Gastrointestinal system: Abdomen is nondistended, soft and nontender. No organomegaly or masses felt. Normal bowel sounds heard. Central nervous system: Alert and oriented. CN 2-12 grossly in tact, finger-nose symmetric and appropriate, answers all questions appropriately, moves all extremities spontaneously  Extremities:  Symmetric 5 x 5 power.  Skin: No rashes. Small clean ulcer on plantar surface left 5th metatarsal head - present on admission  Psychiatry: Judgement and insight appear normal. Mood & affect appropriate.   Data Reviewed: I have personally reviewed following labs and imaging studies  CBC:  Recent Labs Lab 04/18/16 1659 04/18/16 2232 04/19/16 0434 04/20/16 0643  WBC 10.8 10.4 9.8 9.1  NEUTROABS 7.8*  --  7.4 6.9  HGB  --  12.3* 11.9* 10.5*  HCT 34.1* 37.2* 36.2* 33.1*  MCV 92 96.1 95.5 96.5  PLT 205 191 173 [id number]   Basic Metabolic Panel:  Recent Labs Lab 04/18/16 1659 04/18/16 2232 04/19/16 0434 04/20/16 0643  NA 140 137 141 138  K 4.5 3.8 4.1 4.1  CL 105 109 111 114*  CO2 21 20* 22 20*  GLUCOSE 96 133* 96 86  BUN 29* 28* 26* 24*  CREATININE 1.36* 1.26* 1.16 0.97  CALCIUM 8.8 9.1 9.0 8.4*  MG  --   --  2.1  --    GFR: Estimated Creatinine Clearance: 39.3 mL/min (by C-G formula based on SCr of 0.97 mg/dL). Liver Function Tests:  Recent Labs Lab 04/18/16 1659 04/18/16 2232 04/19/16 0434  AST 17 24 23   ALT 13 9* 19  ALKPHOS 46 44 43  BILITOT 0.3 0.3 0.4  PROT 6.0 6.1* 5.9*  ALBUMIN 3.4* 3.3* 3.3*   No results for input(s): LIPASE, AMYLASE in the last 168 hours. No results for input(s): AMMONIA in the last 168 hours. Coagulation Profile: No results for input(s): INR, PROTIME in the last 168 hours. Cardiac Enzymes:  Recent Labs Lab 04/19/16 0620 04/19/16 1538  TROPONINI <0.03 <0.03   BNP (last 3 results) No results for input(s): PROBNP in the last 8760 hours. HbA1C: No results for input(s): HGBA1C in the last 72 hours. CBG:  Recent Labs Lab 04/19/16 0046 04/19/16 0739 04/20/16 0753  GLUCAP 108* 96 91   Lipid Profile: No results for input(s): CHOL, HDL, LDLCALC, TRIG, CHOLHDL, LDLDIRECT in the last 72 hours. Thyroid Function Tests:  Recent Labs  04/19/16 0434  TSH 2.286   Anemia Panel: No results for input(s): VITAMINB12, FOLATE,  FERRITIN, TIBC, IRON, RETICCTPCT in the last 72 hours. Sepsis Labs: No results for input(s): PROCALCITON, LATICACIDVEN in the last 168 hours.  Recent Results (from the past 240 hour(s))  MRSA PCR Screening     Status: None   Collection Time: 04/19/16  4:04 AM  Result Value Ref Range Status   MRSA by PCR NEGATIVE NEGATIVE Final    Comment:        The GeneXpert MRSA Assay (FDA approved for NASAL specimens only), is one component of a comprehensive MRSA colonization surveillance program. It is not intended to diagnose MRSA infection nor to guide or monitor treatment for MRSA infections.          Radiology Studies:  Dg Chest 2 View  Result Date: 04/18/2016 CLINICAL DATA:  Syncope.  Weakness and dyspnea tonight. EXAM: CHEST  2 VIEW COMPARISON:  None. FINDINGS: The lungs are clear. The pulmonary vasculature is normal. Heart size is normal. Hilar and mediastinal contours are unremarkable. There is no pleural effusion. IMPRESSION: No active cardiopulmonary disease. Electronically Signed   By: Andreas Newport M.D.   On: 04/18/2016 22:50        Scheduled Meds: . carbidopa-levodopa  1.5 tablet Oral TID  . enoxaparin (LOVENOX) injection  40 mg Subcutaneous Q24H  . famotidine  20 mg Oral Daily  . feeding supplement (PRO-STAT SUGAR FREE 64)  30 mL Oral BID  . megestrol  800 mg Oral Daily  . mirtazapine  30 mg Oral QHS  . multivitamin with minerals  1 tablet Oral Daily  . mupirocin ointment  1 application Nasal BID  . psyllium  1 packet Oral Daily  . sodium chloride flush  3 mL Intravenous Q12H  . vitamin B-12  1,000 mcg Oral Daily   Continuous Infusions:     LOS: 1 day    Time spent: 30 minutes     Shon Millet, DO [facility] Pager [phone]  If 7PM-7AM, please contact night-coverage [email or web address] Password [email or web address] 04/20/2016, 9:06 AM

## 2016-04-20 NOTE — Progress Notes (Signed)
  Echocardiogram 2D Echocardiogram has been performed.  Lawrence Turner 04/20/2016, 10:17 AM

## 2016-04-20 NOTE — Progress Notes (Signed)
Please place on FL2 patient requires [facility] PT when he returns to ALF. Per nursing staff at [facility], this all [facility] their [facility] provider requires.

## 2016-04-21 DIAGNOSIS — K5901 Slow transit constipation: Secondary | ICD-10-CM

## 2016-04-21 DIAGNOSIS — E2740 Unspecified adrenocortical insufficiency: Secondary | ICD-10-CM

## 2016-04-21 DIAGNOSIS — L8992 Pressure ulcer of unspecified site, stage 2: Secondary | ICD-10-CM | POA: Diagnosis present

## 2016-04-21 DIAGNOSIS — G629 Polyneuropathy, unspecified: Secondary | ICD-10-CM

## 2016-04-21 DIAGNOSIS — E2749 Other adrenocortical insufficiency: Secondary | ICD-10-CM | POA: Diagnosis present

## 2016-04-21 LAB — BASIC METABOLIC PANEL
ANION GAP: 8 (ref 5–15)
BUN: 24 mg/dL — ABNORMAL HIGH (ref 6–20)
CO2: 18 mmol/L — ABNORMAL LOW (ref 22–32)
Calcium: 8.9 mg/dL (ref 8.9–10.3)
Chloride: 112 mmol/L — ABNORMAL HIGH (ref 101–111)
Creatinine, Ser: 0.96 mg/dL (ref 0.61–1.24)
GFR calc Af Amer: >60 mL/min (ref >60)
Glucose, Bld: 104 mg/dL — ABNORMAL HIGH (ref 65–99)
POTASSIUM: 4 mmol/L (ref 3.5–5.1)
SODIUM: 138 mmol/L (ref 135–145)

## 2016-04-21 LAB — CBC WITH DIFFERENTIAL/PLATELET
BASOS ABS: 0 10*3/uL (ref 0.0–0.1)
Basophils Relative: 0 %
EOS ABS: 0.1 10*3/uL (ref 0.0–0.7)
EOS PCT: 1 %
HCT: 32.8 % — ABNORMAL LOW (ref 39.0–52.0)
HEMOGLOBIN: 10.8 g/dL — AB (ref 13.0–17.0)
LYMPHS PCT: 12 %
Lymphs Abs: 1.1 10*3/uL (ref 0.7–4.0)
MCH: 31.2 pg (ref 26.0–34.0)
MCHC: 32.9 g/dL (ref 30.0–36.0)
MCV: 94.8 fL (ref 78.0–100.0)
Monocytes Absolute: 0.6 10*3/uL (ref 0.1–1.0)
Monocytes Relative: 7 %
NEUTROS PCT: 80 %
Neutro Abs: 7.3 10*3/uL (ref 1.7–7.7)
PLATELETS: 163 10*3/uL (ref 150–400)
RBC: 3.46 MIL/uL — AB (ref 4.22–5.81)
RDW: 15.3 % (ref 11.5–15.5)
WBC: 9.1 10*3/uL (ref 4.0–10.5)

## 2016-04-21 LAB — GLUCOSE, CAPILLARY: GLUCOSE-CAPILLARY: 100 mg/dL — AB (ref 65–99)

## 2016-04-21 MED ORDER — PSYLLIUM 95 % PO PACK
1.0000 | PACK | Freq: Three times a day (TID) | ORAL | Status: DC
  Administered 2016-04-21 – 2016-04-30 (×26): 1 via ORAL
  Filled 2016-04-21 (×29): qty 1

## 2016-04-21 NOTE — Progress Notes (Addendum)
PROGRESS NOTE    Lawrence Turner  [id number] DOB: 05/12/1926 DOA: 04/18/2016 PCP: Jeanmarie Hubert, MD   Brief Narrative:  Lawrence Turner is a 82 y.o. male with Parkinson's disease, hypertension who has been experiencing episodes of staring spells and loss of consciousness. He states this has been going on for 2 weeks. He states that he does not feel the episodes before they happen. He is not sure if he loses consciousness during the episodes but is told by witnesses that he has staring spells, confusion, and sometimes loss of consciousness. He denies as vision loss, focal weakness, loss of control of bladder or bowel, tongue biting. He does not believe he has hit his head. He followed up with his neurologist Dr. Jannifer Franklin in office on 8/30 for evaluation for these episodes. During his appointment, he had another episode where he was observed to be dyspneic, decreased responsiveness, and his orthostatic BP showed a 50 point drop from sitting to standing. Dr. Jannifer Franklin ordered 2D echo as well as CXR. However, patient had another episode at home which prompted his son to bring patient into the ED. ED physician discussed with on-call neurologist Dr. Leonel Ramsay who recommended EEG as well.    Assessment & Plan:   Principal Problem:   Adrenal insufficiency (HCC) Active Problems:   Uncontrolled hypertension   Peripheral neuropathy (HCC)   Normocytic anemia   Parkinson's disease (HCC)   Constipation   Ulcer of foot (HCC)   Orthostatic hypotension   Syncope   Pressure ulcer stage II  Adrenal insufficiency  ACTH stim test completed  ACTH level pending  Cortef 20mg  in morning, 10mg  in evening  Will set up to follow up with Dr. Elayne Snare [facility] endocrine) on discharge   Orthostatic hypotension not improved this morning, will repeat tomorrow morning   Orthostatic hypotension   Tele showing NSR with PACs, EKG NSR rate 90  EEG - normal   Echo - normal   Hold remeron tonight, may have to stop sinemet as  well but will re-eval tomorrow   AKI, possibly dehydration - resolved. Now at baseline   UA negative  Monitor BMP  Uncontrolled HTN with episodes of orthostatic hypotension   Continue to monitor, hydralazine prn for SBP > 180   Chronic normocytic anemia  Monitor CBC  Parkinson's disease  Follows with Dr. Jannifer Franklin, neurology  Continue Sinemet  Left foot ulcer, present on admission   Clean, non draining on exam  Followed as outpatient   Pressure ulcer stage 2, posterior coccyx, present on admission per family   Continue conservative management, off loading as much as possible   Constipation  Bowel regimen   DVT prophylaxis: Lovenox Code Status: DNR Family Communication: Spoke with son [name] at length over the phone this morning [phone]  Disposition Plan: Hopefully 24-48 hours pending improvements in orthostatic hypotension    Consultants:   None  Procedures:   None   Antimicrobials:   None    Subjective: Patient has not had any acute episodes of events overnight. He is sitting in chair, eating breakfast. He has no complaints this morning. Orthostatic BP this morning was 152/74 lying, 88/56 sitting, 53/23 standing. He denies any headaches, further staring spells, dizziness, chest pain, shortness of breath, abdominal pain, nausea, vomiting. He does complain of constipation.    Objective: Vitals:   04/21/16 0500 04/21/16 0544 04/21/16 0626 04/21/16 1010  BP:  (!) 184/91 (!) 187/91 (!) 152/74  Pulse:  67 70 95  Resp:  19  Temp:  97.7 F (36.5 C)    TempSrc:  Oral    SpO2:  97%  100%  Weight: 62.5 kg (137 lb 11.2 oz)     Height:        Intake/Output Summary (Last 24 hours) at 04/21/16 1041 Last data filed at 04/21/16 [id number]  Gross per 24 hour  Intake              240 ml  Output             1500 ml  Net            -1260 ml   Filed Weights   04/19/16 0357 04/20/16 0617 04/21/16 0500  Weight: 61.3 kg (135 lb 1.6 oz) 62.6 kg (138 lb 1.6 oz) 62.5 kg  (137 lb 11.2 oz)    Examination:  General exam: Appears calm and comfortable  Respiratory system: Clear to auscultation. Respiratory effort normal. Cardiovascular system: S1 & S2 heard, RRR. No JVD, murmurs, rubs, gallops or clicks. No pedal edema. Gastrointestinal system: Abdomen is nondistended, soft and nontender. No organomegaly or masses felt. Normal bowel sounds heard. Central nervous system: Alert and oriented. CN 2-12 grossly in tact, finger-nose symmetric and appropriate, answers all questions appropriately, moves all extremities spontaneously  Extremities: Symmetric 5 x 5 power.  Skin: No rashes. Small clean ulcer on plantar surface left 5th metatarsal head - present on admission  Psychiatry: Judgement and insight appear normal. Mood & affect appropriate.   Data Reviewed: I have personally reviewed following labs and imaging studies  CBC:  Recent Labs Lab 04/18/16 1659 04/18/16 2232 04/19/16 0434 04/20/16 0643 04/21/16 0411  WBC 10.8 10.4 9.8 9.1 9.1  NEUTROABS 7.8*  --  7.4 6.9 7.3  HGB  --  12.3* 11.9* 10.5* 10.8*  HCT 34.1* 37.2* 36.2* 33.1* 32.8*  MCV 92 96.1 95.5 96.5 94.8  PLT 205 191 173 168 [id number]   Basic Metabolic Panel:  Recent Labs Lab 04/18/16 1659 04/18/16 2232 04/19/16 0434 04/20/16 0643 04/21/16 0411  NA 140 137 141 138 138  K 4.5 3.8 4.1 4.1 4.0  CL 105 109 111 114* 112*  CO2 21 20* 22 20* 18*  GLUCOSE 96 133* 96 86 104*  BUN 29* 28* 26* 24* 24*  CREATININE 1.36* 1.26* 1.16 0.97 0.96  CALCIUM 8.8 9.1 9.0 8.4* 8.9  MG  --   --  2.1  --   --    GFR: Estimated Creatinine Clearance: 39.7 mL/min (by C-G formula based on SCr of 0.96 mg/dL). Liver Function Tests:  Recent Labs Lab 04/18/16 1659 04/18/16 2232 04/19/16 0434  AST 17 24 23   ALT 13 9* 19  ALKPHOS 46 44 43  BILITOT 0.3 0.3 0.4  PROT 6.0 6.1* 5.9*  ALBUMIN 3.4* 3.3* 3.3*   No results for input(s): LIPASE, AMYLASE in the last 168 hours. No results for input(s): AMMONIA in the  last 168 hours. Coagulation Profile: No results for input(s): INR, PROTIME in the last 168 hours. Cardiac Enzymes:  Recent Labs Lab 04/19/16 0620 04/19/16 1538  TROPONINI <0.03 <0.03   BNP (last 3 results) No results for input(s): PROBNP in the last 8760 hours. HbA1C: No results for input(s): HGBA1C in the last 72 hours. CBG:  Recent Labs Lab 04/19/16 0046 04/19/16 0739 04/20/16 0753 04/21/16 0523  GLUCAP 108* 96 91 100*   Lipid Profile: No results for input(s): CHOL, HDL, LDLCALC, TRIG, CHOLHDL, LDLDIRECT in the last 72 hours. Thyroid Function Tests:  Recent Labs  04/19/16 0434  TSH 2.286   Anemia Panel: No results for input(s): VITAMINB12, FOLATE, FERRITIN, TIBC, IRON, RETICCTPCT in the last 72 hours. Sepsis Labs: No results for input(s): PROCALCITON, LATICACIDVEN in the last 168 hours.  Recent Results (from the past 240 hour(s))  MRSA PCR Screening     Status: None   Collection Time: 04/19/16  4:04 AM  Result Value Ref Range Status   MRSA by PCR NEGATIVE NEGATIVE Final    Comment:        The GeneXpert MRSA Assay (FDA approved for NASAL specimens only), is one component of a comprehensive MRSA colonization surveillance program. It is not intended to diagnose MRSA infection nor to guide or monitor treatment for MRSA infections.          Radiology Studies: No results found.      Scheduled Meds: . carbidopa-levodopa  1.5 tablet Oral TID  . enoxaparin (LOVENOX) injection  40 mg Subcutaneous Q24H  . famotidine  20 mg Oral Daily  . feeding supplement (PRO-STAT SUGAR FREE 64)  30 mL Oral BID  . hydrocortisone  10 mg Oral q1800  . hydrocortisone  20 mg Oral q morning - 10a  . megestrol  800 mg Oral Daily  . mirtazapine  30 mg Oral QHS  . multivitamin with minerals  1 tablet Oral Daily  . mupirocin ointment  1 application Nasal BID  . psyllium  1 packet Oral TID  . sodium chloride flush  3 mL Intravenous Q12H  . vitamin B-12  1,000 mcg Oral  Daily   Continuous Infusions:     LOS: 2 days    Time spent: 30 minutes     Shon Millet, DO [facility] Pager [phone]  If 7PM-7AM, please contact night-coverage [email or web address] Password [email or web address] 04/21/2016, 10:41 AM

## 2016-04-22 LAB — CBC WITH DIFFERENTIAL/PLATELET
BASOS ABS: 0 10*3/uL (ref 0.0–0.1)
BASOS PCT: 0 %
Eosinophils Absolute: 0.1 10*3/uL (ref 0.0–0.7)
Eosinophils Relative: 1 %
HEMATOCRIT: 31.5 % — AB (ref 39.0–52.0)
HEMOGLOBIN: 10.2 g/dL — AB (ref 13.0–17.0)
Lymphocytes Relative: 11 %
Lymphs Abs: 1.1 10*3/uL (ref 0.7–4.0)
MCH: 30.8 pg (ref 26.0–34.0)
MCHC: 32.4 g/dL (ref 30.0–36.0)
MCV: 95.2 fL (ref 78.0–100.0)
Monocytes Absolute: 0.7 10*3/uL (ref 0.1–1.0)
Monocytes Relative: 8 %
NEUTROS ABS: 7.5 10*3/uL (ref 1.7–7.7)
NEUTROS PCT: 80 %
Platelets: 161 10*3/uL (ref 150–400)
RBC: 3.31 MIL/uL — AB (ref 4.22–5.81)
RDW: 15.3 % (ref 11.5–15.5)
WBC: 9.4 10*3/uL (ref 4.0–10.5)

## 2016-04-22 LAB — BASIC METABOLIC PANEL
ANION GAP: 5 (ref 5–15)
BUN: 27 mg/dL — ABNORMAL HIGH (ref 6–20)
CALCIUM: 8.6 mg/dL — AB (ref 8.9–10.3)
CO2: 21 mmol/L — AB (ref 22–32)
Chloride: 109 mmol/L (ref 101–111)
Creatinine, Ser: 1.06 mg/dL (ref 0.61–1.24)
GFR calc non Af Amer: >60 mL/min (ref >60)
Glucose, Bld: 103 mg/dL — ABNORMAL HIGH (ref 65–99)
Potassium: 4 mmol/L (ref 3.5–5.1)
Sodium: 135 mmol/L (ref 135–145)

## 2016-04-22 LAB — GLUCOSE, CAPILLARY: Glucose-Capillary: 105 mg/dL — ABNORMAL HIGH (ref 65–99)

## 2016-04-22 NOTE — Progress Notes (Signed)
PROGRESS NOTE    Jamont Cocanougher  [id number] DOB: 03-03-1926 DOA: 04/18/2016 PCP: Jeanmarie Hubert, MD   Brief Narrative:  Lawrence Turner is a 82 y.o. male with Parkinson's disease, hypertension who has been experiencing episodes of staring spells and loss of consciousness. He states this has been going on for 2 weeks. He states that he does not feel the episodes before they happen. He is not sure if he loses consciousness during the episodes but is told by witnesses that he has staring spells, confusion, and sometimes loss of consciousness. He denies as vision loss, focal weakness, loss of control of bladder or bowel, tongue biting. He does not believe he has hit his head. He followed up with his neurologist Dr. Jannifer Franklin in office on 8/30 for evaluation for these episodes. During his appointment, he had another episode where he was observed to be dyspneic, decreased responsiveness, and his orthostatic BP showed a 50 point drop from sitting to standing. Dr. Jannifer Franklin ordered 2D echo as well as CXR. However, patient had another episode at home which prompted his son to bring patient into the ED. ED physician discussed with on-call neurologist Dr. Leonel Ramsay who recommended EEG as well.    Assessment & Plan:   Principal Problem:   Adrenal insufficiency (HCC) Active Problems:   Uncontrolled hypertension   Peripheral neuropathy (HCC)   Normocytic anemia   Parkinson's disease (HCC)   Constipation   Ulcer of foot (HCC)   Orthostatic hypotension   Syncope   Pressure ulcer stage II  Adrenal insufficiency  ACTH stim test completed  ACTH level pending - send out lab   Cortef 20mg  in morning, 10mg  in evening  Will set up to follow up with Dr. Elayne Snare [facility] endocrine) on discharge   Orthostatic hypotension not improved this morning, will repeat tomorrow morning   Orthostatic hypotension   Tele showing NSR with PACs, EKG NSR rate 90  EEG - normal   Echo - normal   Hold remeron, will also hold  sinemet today as these medications can cause orthostatic hypotension  AKI, possibly dehydration - resolved. Now at baseline   UA negative  Monitor BMP  Uncontrolled HTN with episodes of orthostatic hypotension   Continue to monitor, hydralazine prn for SBP > 180   Chronic normocytic anemia  Monitor CBC  Parkinson's disease  Follows with Dr. Jannifer Franklin, neurology  Holding sinemet   Left foot ulcer, present on admission   Clean, non draining on exam  Consult wound   Pressure ulcer stage 2, posterior coccyx, present on admission per family   Continue conservative management, off loading as much as possible   Consult wound   Constipation  Bowel regimen   DVT prophylaxis: Lovenox Code Status: DNR Family Communication: Spoke with son [name] at length over the phone today [phone]  Disposition Plan: Hopefully 24-48 hours pending improvements in orthostatic hypotension    Consultants:   None  Procedures:   None   Antimicrobials:   None    Subjective: Patient has not had any acute episodes of events overnight. Orthostatic BP this morning was 152/76 lying, 107/62 sitting, 69/38 standing. He denies any headaches, further staring spells, dizziness, chest pain, shortness of breath, abdominal pain, nausea, vomiting. He had a BM yesterday.    Objective: Vitals:   04/22/16 0456 04/22/16 0500 04/22/16 0950 04/22/16 1048  BP: (!) 176/81  (!) 152/76 (!) 145/73  Pulse: 65  88 77  Resp: 19   19  Temp: 97.4 F (36.3 C)  98.3 F (36.8 C)  TempSrc: Oral   Oral  SpO2: 99%  100% 100%  Weight:  60.7 kg (133 lb 14.4 oz)    Height:        Intake/Output Summary (Last 24 hours) at 04/22/16 1229 Last data filed at 04/22/16 0954  Gross per 24 hour  Intake              860 ml  Output              850 ml  Net               10 ml   Filed Weights   04/20/16 0617 04/21/16 0500 04/22/16 0500  Weight: 62.6 kg (138 lb 1.6 oz) 62.5 kg (137 lb 11.2 oz) 60.7 kg (133 lb 14.4 oz)     Examination:  General exam: Appears calm and comfortable  Respiratory system: Clear to auscultation. Respiratory effort normal. Cardiovascular system: S1 & S2 heard, RRR. No JVD, murmurs, rubs, gallops or clicks. No pedal edema. Gastrointestinal system: Abdomen is nondistended, soft and nontender. No organomegaly or masses felt. Normal bowel sounds heard. Central nervous system: Alert and oriented. CN 2-12 grossly in tact, finger-nose symmetric and appropriate, answers all questions appropriately, moves all extremities spontaneously  Extremities: Symmetric 5 x 5 power.  Skin: No rashes. Small clean ulcer on plantar surface left 5th metatarsal head - present on admission  Psychiatry: Judgement and insight appear normal. Mood & affect appropriate.   Data Reviewed: I have personally reviewed following labs and imaging studies  CBC:  Recent Labs Lab 04/18/16 1659 04/18/16 2232 04/19/16 0434 04/20/16 0643 04/21/16 0411 04/22/16 0523  WBC 10.8 10.4 9.8 9.1 9.1 9.4  NEUTROABS 7.8*  --  7.4 6.9 7.3 7.5  HGB  --  12.3* 11.9* 10.5* 10.8* 10.2*  HCT 34.1* 37.2* 36.2* 33.1* 32.8* 31.5*  MCV 92 96.1 95.5 96.5 94.8 95.2  PLT 205 191 173 168 163 [id number]   Basic Metabolic Panel:  Recent Labs Lab 04/18/16 2232 04/19/16 0434 04/20/16 0643 04/21/16 0411 04/22/16 0523  NA 137 141 138 138 135  K 3.8 4.1 4.1 4.0 4.0  CL 109 111 114* 112* 109  CO2 20* 22 20* 18* 21*  GLUCOSE 133* 96 86 104* 103*  BUN 28* 26* 24* 24* 27*  CREATININE 1.26* 1.16 0.97 0.96 1.06  CALCIUM 9.1 9.0 8.4* 8.9 8.6*  MG  --  2.1  --   --   --    GFR: Estimated Creatinine Clearance: 36 mL/min (by C-G formula based on SCr of 1.06 mg/dL). Liver Function Tests:  Recent Labs Lab 04/18/16 1659 04/18/16 2232 04/19/16 0434  AST 17 24 23   ALT 13 9* 19  ALKPHOS 46 44 43  BILITOT 0.3 0.3 0.4  PROT 6.0 6.1* 5.9*  ALBUMIN 3.4* 3.3* 3.3*   No results for input(s): LIPASE, AMYLASE in the last 168 hours. No results  for input(s): AMMONIA in the last 168 hours. Coagulation Profile: No results for input(s): INR, PROTIME in the last 168 hours. Cardiac Enzymes:  Recent Labs Lab 04/19/16 0620 04/19/16 1538  TROPONINI <0.03 <0.03   BNP (last 3 results) No results for input(s): PROBNP in the last 8760 hours. HbA1C: No results for input(s): HGBA1C in the last 72 hours. CBG:  Recent Labs Lab 04/19/16 0046 04/19/16 0739 04/20/16 0753 04/21/16 0523 04/22/16 0455  GLUCAP 108* 96 91 100* 105*   Lipid Profile: No results for input(s): CHOL, HDL, LDLCALC, TRIG, CHOLHDL, LDLDIRECT  in the last 72 hours. Thyroid Function Tests: No results for input(s): TSH, T4TOTAL, FREET4, T3FREE, THYROIDAB in the last 72 hours. Anemia Panel: No results for input(s): VITAMINB12, FOLATE, FERRITIN, TIBC, IRON, RETICCTPCT in the last 72 hours. Sepsis Labs: No results for input(s): PROCALCITON, LATICACIDVEN in the last 168 hours.  Recent Results (from the past 240 hour(s))  MRSA PCR Screening     Status: None   Collection Time: 04/19/16  4:04 AM  Result Value Ref Range Status   MRSA by PCR NEGATIVE NEGATIVE Final    Comment:        The GeneXpert MRSA Assay (FDA approved for NASAL specimens only), is one component of a comprehensive MRSA colonization surveillance program. It is not intended to diagnose MRSA infection nor to guide or monitor treatment for MRSA infections.          Radiology Studies: No results found.      Scheduled Meds: . enoxaparin (LOVENOX) injection  40 mg Subcutaneous Q24H  . famotidine  20 mg Oral Daily  . feeding supplement (PRO-STAT SUGAR FREE 64)  30 mL Oral BID  . hydrocortisone  10 mg Oral q1800  . hydrocortisone  20 mg Oral q morning - 10a  . megestrol  800 mg Oral Daily  . multivitamin with minerals  1 tablet Oral Daily  . mupirocin ointment  1 application Nasal BID  . psyllium  1 packet Oral TID  . sodium chloride flush  3 mL Intravenous Q12H  . vitamin B-12   1,000 mcg Oral Daily   Continuous Infusions:     LOS: 3 days    Time spent: 30 minutes     Shon Millet, DO [facility] Pager [phone]  If 7PM-7AM, please contact night-coverage [email or web address] Password [facility] 04/22/2016, 12:29 PM

## 2016-04-23 LAB — CBC WITH DIFFERENTIAL/PLATELET
Basophils Absolute: 0 10*3/uL (ref 0.0–0.1)
Basophils Relative: 0 %
EOS ABS: 0.1 10*3/uL (ref 0.0–0.7)
EOS PCT: 1 %
HCT: 31 % — ABNORMAL LOW (ref 39.0–52.0)
Hemoglobin: 10.4 g/dL — ABNORMAL LOW (ref 13.0–17.0)
Lymphocytes Relative: 13 %
Lymphs Abs: 1.2 10*3/uL (ref 0.7–4.0)
MCH: 31.6 pg (ref 26.0–34.0)
MCHC: 33.5 g/dL (ref 30.0–36.0)
MCV: 94.2 fL (ref 78.0–100.0)
MONO ABS: 0.8 10*3/uL (ref 0.1–1.0)
Monocytes Relative: 9 %
NEUTROS PCT: 77 %
Neutro Abs: 7.2 10*3/uL (ref 1.7–7.7)
PLATELETS: 164 10*3/uL (ref 150–400)
RBC: 3.29 MIL/uL — AB (ref 4.22–5.81)
RDW: 15.1 % (ref 11.5–15.5)
WBC: 9.3 10*3/uL (ref 4.0–10.5)

## 2016-04-23 LAB — BASIC METABOLIC PANEL
ANION GAP: 5 (ref 5–15)
BUN: 32 mg/dL — ABNORMAL HIGH (ref 6–20)
CO2: 22 mmol/L (ref 22–32)
Calcium: 8.5 mg/dL — ABNORMAL LOW (ref 8.9–10.3)
Chloride: 109 mmol/L (ref 101–111)
Creatinine, Ser: 1.19 mg/dL (ref 0.61–1.24)
GFR, EST NON AFRICAN AMERICAN: 53 mL/min — AB (ref >60)
Glucose, Bld: 117 mg/dL — ABNORMAL HIGH (ref 65–99)
POTASSIUM: 3.7 mmol/L (ref 3.5–5.1)
SODIUM: 136 mmol/L (ref 135–145)

## 2016-04-23 LAB — GLUCOSE, CAPILLARY: GLUCOSE-CAPILLARY: 112 mg/dL — AB (ref 65–99)

## 2016-04-23 MED ORDER — CARBIDOPA-LEVODOPA 25-100 MG PO TABS
1.0000 | ORAL_TABLET | Freq: Three times a day (TID) | ORAL | Status: DC
  Administered 2016-04-23 – 2016-04-26 (×9): 1 via ORAL
  Filled 2016-04-23 (×9): qty 1

## 2016-04-23 NOTE — Progress Notes (Signed)
CSW continuing to follow for return to [place] when stable  Jorge Ny, [place] Social Worker [phone]

## 2016-04-23 NOTE — Progress Notes (Signed)
Physical Therapy Treatment Patient Details Name: Phill Mathwig MRN: [id number] DOB: 03-05-1926 Today's Date: 04/23/2016    History of Present Illness  Koalii Ings is a 82 y.o. male with Parkinson's disease, hypertension has been experiencing episodes of staring spells and loss of consciousness  Work up includes orthostatics and tachycardia.    PT Comments    Progressing slowly, limited by increased tremors and low BP's that keep pt from ambulating in the halls.   Follow Up Recommendations  Home health PT;Other (comment) ([facility], but will need some extra care from staff.)     Equipment Recommendations  None recommended by PT    Recommendations for Other Services       Precautions / Restrictions Precautions Precautions: Fall    Mobility  Bed Mobility               General bed mobility comments: out of bed in recliner  Transfers Overall transfer level: Needs assistance Equipment used: Rolling walker (2 wheeled) Transfers: Sit to/from [facility] Sit to Stand: Min assist Stand pivot transfers: Min assist       General transfer comment: assisted up and to the [facility].  Pt needed cleaning up and was shaky from having been taken off sinemet due to low BP  Ambulation/Gait             General Gait Details: pt did not feel up to proceeding to gait.   Stairs            Wheelchair Mobility    Modified Rankin (Stroke Patients Only)       Balance     Sitting balance-Leahy Scale: Good       Standing balance-Leahy Scale: Poor Standing balance comment: posterior lean and difficultly making balance corrections.                    Cognition Arousal/Alertness: Awake/alert Behavior During Therapy: Flat affect Overall Cognitive Status: Within Functional Limits for tasks assessed                      Exercises      General Comments        Pertinent Vitals/Pain Pain Assessment: No/denies pain    Home Living                       Prior Function            PT Goals (current goals can now be found in the care plan section) Acute Rehab PT Goals Patient Stated Goal: back able to do for myself PT Goal Formulation: With patient Time For Goal Achievement: 04/26/16 Potential to Achieve Goals: Good Progress towards PT goals: Progressing toward goals    Frequency  Min 3X/week    PT Plan Current plan remains appropriate    Co-evaluation             End of Session   Activity Tolerance: Other (comment);Treatment limited secondary to medical complications (Comment) (limited by shaky/tremors and dizziness) Patient left: in chair;with call bell/phone within reach;with chair alarm set     Time: 1300-1320 PT Time Calculation (min) (ACUTE ONLY): 20 min  Charges:  $Therapeutic Activity: 8-22 mins                    G Codes:      Arthi Mcdonald, Tessie Fass 04/23/2016, 4:41 PM 04/23/2016  Donnella Sham, PT [phone]  (pager)

## 2016-04-23 NOTE — Progress Notes (Signed)
PROGRESS NOTE    Lawrence Turner  [id number] DOB: 1926/02/15 DOA: 04/18/2016 PCP: Jeanmarie Hubert, MD   Brief Narrative:  Lawrence Turner is a 82 y.o. male with Parkinson's disease, hypertension who has been experiencing episodes of staring spells and loss of consciousness. He states this has been going on for 2 weeks. He states that he does not feel the episodes before they happen. He is not sure if he loses consciousness during the episodes but is told by witnesses that he has staring spells, confusion, and sometimes loss of consciousness. He denies as vision loss, focal weakness, loss of control of bladder or bowel, tongue biting. He does not believe he has hit his head. He followed up with his neurologist Dr. Jannifer Franklin in office on 8/30 for evaluation for these episodes. During his appointment, he had another episode where he was observed to be dyspneic, decreased responsiveness, and his orthostatic BP showed a 50 point drop from sitting to standing. Dr. Jannifer Franklin ordered 2D echo as well as CXR. However, patient had another episode at home which prompted his son to bring patient into the ED. ED physician discussed with on-call neurologist Dr. Leonel Ramsay who recommended EEG as well.    Assessment & Plan:   Principal Problem:   Adrenal insufficiency (HCC) Active Problems:   Uncontrolled hypertension   Peripheral neuropathy (HCC)   Normocytic anemia   Parkinson's disease (HCC)   Constipation   Ulcer of foot (HCC)   Orthostatic hypotension   Syncope   Pressure ulcer stage II  Adrenal insufficiency  ACTH stim test completed  ACTH level pending - send out lab   Cortef 20mg  in morning, 10mg  in evening  Will set up to follow up with Dr. Elayne Snare [facility] endocrine) on discharge   Orthostatic hypotension improved this morning   Orthostatic hypotension   Tele showing NSR with PACs, EKG NSR rate 90  EEG - normal   Echo - normal   Hold remeron  Orthostatic hyoptension improved this morning, but  symptoms of tremor worse, will add back sinemet in a lower dose today   Parkinson's disease  Follows with Dr. Jannifer Franklin, neurology  AKI, possibly dehydration - resolved. Now at baseline   UA negative  Monitor BMP  Uncontrolled HTN with episodes of orthostatic hypotension   Continue to monitor, hydralazine prn for SBP > 180   Chronic normocytic anemia  Monitor CBC  Left foot ulcer, present on admission   Clean, non draining on exam  Consult wound   Pressure ulcer stage 2, posterior coccyx, present on admission per family   Continue conservative management, off loading as much as possible   Turn every 2 hours   Consult wound   Constipation  Bowel regimen   DVT prophylaxis: Lovenox Code Status: DNR Family Communication: Spoke with son [name] at length over the phone today [phone]  Disposition Plan: Hopefully 24-48 hours pending improvements in orthostatic hypotension    Consultants:   None  Procedures:   None   Antimicrobials:   None    Subjective: Patient has not had any acute episodes of events overnight. Orthostatic BP this morning was 128/71 lying, 89/49 sitting, 115/79 standing. He denies any headaches, further staring spells, dizziness, chest pain, shortness of breath, abdominal pain, nausea, vomiting. He complains of worsening tremors bilaterally with stopping of his medication, sinemet.    Objective: Vitals:   04/22/16 1427 04/22/16 2200 04/23/16 0502 04/23/16 1233  BP: 124/62 (!) 160/84 (!) 191/81 128/71  Pulse: 75 73 62 87  Resp: 19 18 18    Temp: 98.6 F (37 C) 97.4 F (36.3 C) 97.9 F (36.6 C)   TempSrc:  Oral    SpO2: 100% 99% 100% 99%  Weight:   60.6 kg (133 lb 9.6 oz)   Height:        Intake/Output Summary (Last 24 hours) at 04/23/16 1251 Last data filed at 04/23/16 1040  Gross per 24 hour  Intake              240 ml  Output             1250 ml  Net            -1010 ml   Filed Weights   04/21/16 0500 04/22/16 0500 04/23/16 0502    Weight: 62.5 kg (137 lb 11.2 oz) 60.7 kg (133 lb 14.4 oz) 60.6 kg (133 lb 9.6 oz)    Examination:  General exam: Appears calm and comfortable  Respiratory system: Clear to auscultation. Respiratory effort normal. Cardiovascular system: S1 & S2 heard, RRR. No JVD, murmurs, rubs, gallops or clicks. No pedal edema. Gastrointestinal system: Abdomen is nondistended, soft and nontender. No organomegaly or masses felt. Normal bowel sounds heard. Central nervous system: Alert and oriented. CN 2-12 grossly in tact, finger-nose symmetric and appropriate, answers all questions appropriately, moves all extremities spontaneously. Resting and intention tremor bilateral upper extremities  Extremities: Symmetric 5 x 5 power.  Skin: No rashes. Small clean ulcer on plantar surface left 5th metatarsal head - present on admission  Psychiatry: Judgement and insight appear normal. Mood & affect appropriate.   Data Reviewed: I have personally reviewed following labs and imaging studies  CBC:  Recent Labs Lab 04/19/16 0434 04/20/16 0643 04/21/16 0411 04/22/16 0523 04/23/16 0357  WBC 9.8 9.1 9.1 9.4 9.3  NEUTROABS 7.4 6.9 7.3 7.5 7.2  HGB 11.9* 10.5* 10.8* 10.2* 10.4*  HCT 36.2* 33.1* 32.8* 31.5* 31.0*  MCV 95.5 96.5 94.8 95.2 94.2  PLT 173 168 163 161 [id number]   Basic Metabolic Panel:  Recent Labs Lab 04/19/16 0434 04/20/16 0643 04/21/16 0411 04/22/16 0523 04/23/16 0357  NA 141 138 138 135 136  K 4.1 4.1 4.0 4.0 3.7  CL 111 114* 112* 109 109  CO2 22 20* 18* 21* 22  GLUCOSE 96 86 104* 103* 117*  BUN 26* 24* 24* 27* 32*  CREATININE 1.16 0.97 0.96 1.06 1.19  CALCIUM 9.0 8.4* 8.9 8.6* 8.5*  MG 2.1  --   --   --   --    GFR: Estimated Creatinine Clearance: 32 mL/min (by C-G formula based on SCr of 1.19 mg/dL). Liver Function Tests:  Recent Labs Lab 04/18/16 1659 04/18/16 2232 04/19/16 0434  AST 17 24 23   ALT 13 9* 19  ALKPHOS 46 44 43  BILITOT 0.3 0.3 0.4  PROT 6.0 6.1* 5.9*  ALBUMIN  3.4* 3.3* 3.3*   No results for input(s): LIPASE, AMYLASE in the last 168 hours. No results for input(s): AMMONIA in the last 168 hours. Coagulation Profile: No results for input(s): INR, PROTIME in the last 168 hours. Cardiac Enzymes:  Recent Labs Lab 04/19/16 0620 04/19/16 1538  TROPONINI <0.03 <0.03   BNP (last 3 results) No results for input(s): PROBNP in the last 8760 hours. HbA1C: No results for input(s): HGBA1C in the last 72 hours. CBG:  Recent Labs Lab 04/19/16 0739 04/20/16 0753 04/21/16 0523 04/22/16 0455 04/23/16 0458  GLUCAP 96 91 100* 105* 112*   Lipid Profile: No results  for input(s): CHOL, HDL, LDLCALC, TRIG, CHOLHDL, LDLDIRECT in the last 72 hours. Thyroid Function Tests: No results for input(s): TSH, T4TOTAL, FREET4, T3FREE, THYROIDAB in the last 72 hours. Anemia Panel: No results for input(s): VITAMINB12, FOLATE, FERRITIN, TIBC, IRON, RETICCTPCT in the last 72 hours. Sepsis Labs: No results for input(s): PROCALCITON, LATICACIDVEN in the last 168 hours.  Recent Results (from the past 240 hour(s))  MRSA PCR Screening     Status: None   Collection Time: 04/19/16  4:04 AM  Result Value Ref Range Status   MRSA by PCR NEGATIVE NEGATIVE Final    Comment:        The GeneXpert MRSA Assay (FDA approved for NASAL specimens only), is one component of a comprehensive MRSA colonization surveillance program. It is not intended to diagnose MRSA infection nor to guide or monitor treatment for MRSA infections.          Radiology Studies: No results found.      Scheduled Meds: . carbidopa-levodopa  1 tablet Oral TID  . enoxaparin (LOVENOX) injection  40 mg Subcutaneous Q24H  . famotidine  20 mg Oral Daily  . feeding supplement (PRO-STAT SUGAR FREE 64)  30 mL Oral BID  . hydrocortisone  10 mg Oral q1800  . hydrocortisone  20 mg Oral q morning - 10a  . megestrol  800 mg Oral Daily  . multivitamin with minerals  1 tablet Oral Daily  . mupirocin  ointment  1 application Nasal BID  . psyllium  1 packet Oral TID  . sodium chloride flush  3 mL Intravenous Q12H  . vitamin B-12  1,000 mcg Oral Daily   Continuous Infusions:     LOS: 4 days    Time spent: 30 minutes     Shon Millet, DO [facility] Pager [phone]  If 7PM-7AM, please contact night-coverage [email or web address] Password [email or web address] 04/23/2016, 12:51 PM

## 2016-04-23 NOTE — Consult Note (Addendum)
[place] Nurse wound consult note Reason for Consult: Consult requested for left foot and scarum/buttocks. Wound type: Left outer foot with dry callous; .2X.2cm, dark brown inner  area has been trimmed level with the skin. Currently no odor, drainage, fluctuance, or open wound. Pt states he is followed by a podiatrist prior to admission and he has not recommended any further dressings or topical treatment.  Location can remain open to air and pt can resume follow-up after discharge with his podiatrist. Measurement: Sacrum/coccyx with stage 2 pressure injury noted as present on admission; .2X.1X.1cm, red and moist, no odor, scant amt yellow drainage.  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 There is generalized erythemia and patchy areas of partial thickness skin loss surrounding to approx 4X4cm area on bilat buttocks; appearance consistent with moisture associated skin damage.   Dressing procedure/placement/frequency: Foam dressing to protect and promote healing. Please re-consult if further assistance is needed.  Thank-you,  Julien Girt MSN, [place], [place], [place], [place]

## 2016-04-24 ENCOUNTER — Telehealth: Payer: Self-pay | Admitting: Diagnostic Neuroimaging

## 2016-04-24 LAB — BASIC METABOLIC PANEL
Anion gap: 6 (ref 5–15)
BUN: 29 mg/dL — AB (ref 6–20)
CHLORIDE: 110 mmol/L (ref 101–111)
CO2: 21 mmol/L — ABNORMAL LOW (ref 22–32)
CREATININE: 0.96 mg/dL (ref 0.61–1.24)
Calcium: 8.8 mg/dL — ABNORMAL LOW (ref 8.9–10.3)
GFR calc Af Amer: >60 mL/min (ref >60)
GLUCOSE: 90 mg/dL (ref 65–99)
Potassium: 3.9 mmol/L (ref 3.5–5.1)
SODIUM: 137 mmol/L (ref 135–145)

## 2016-04-24 LAB — CBC WITH DIFFERENTIAL/PLATELET
BASOS ABS: 0 10*3/uL (ref 0.0–0.1)
Basophils Relative: 0 %
EOS PCT: 2 %
Eosinophils Absolute: 0.2 10*3/uL (ref 0.0–0.7)
HEMATOCRIT: 32.6 % — AB (ref 39.0–52.0)
Hemoglobin: 10.7 g/dL — ABNORMAL LOW (ref 13.0–17.0)
Lymphocytes Relative: 10 %
Lymphs Abs: 1 10*3/uL (ref 0.7–4.0)
MCH: 31.1 pg (ref 26.0–34.0)
MCHC: 32.8 g/dL (ref 30.0–36.0)
MCV: 94.8 fL (ref 78.0–100.0)
MONO ABS: 0.8 10*3/uL (ref 0.1–1.0)
Monocytes Relative: 8 %
NEUTROS PCT: 80 %
Neutro Abs: 8 10*3/uL — ABNORMAL HIGH (ref 1.7–7.7)
PLATELETS: 149 10*3/uL — AB (ref 150–400)
RBC: 3.44 MIL/uL — AB (ref 4.22–5.81)
RDW: 15.1 % (ref 11.5–15.5)
WBC: 10 10*3/uL (ref 4.0–10.5)

## 2016-04-24 LAB — GLUCOSE, CAPILLARY
GLUCOSE-CAPILLARY: 98 mg/dL (ref 65–99)
Glucose-Capillary: 164 mg/dL — ABNORMAL HIGH (ref 65–99)

## 2016-04-24 LAB — T4, FREE: FREE T4: 1.33 ng/dL — AB (ref 0.61–1.12)

## 2016-04-24 LAB — TSH: TSH: 2.297 u[IU]/mL (ref 0.350–4.500)

## 2016-04-24 LAB — ACTH: C206 ACTH: 1.3 pg/mL — AB (ref 7.2–63.3)

## 2016-04-24 MED ORDER — SODIUM CHLORIDE 0.9 % IV SOLN
INTRAVENOUS | Status: DC
  Administered 2016-04-24: 20:00:00 via INTRAVENOUS

## 2016-04-24 MED ORDER — FLUDROCORTISONE ACETATE 0.1 MG PO TABS
0.1000 mg | ORAL_TABLET | Freq: Every day | ORAL | Status: DC
  Administered 2016-04-25 – 2016-04-26 (×2): 0.1 mg via ORAL
  Filled 2016-04-24 (×3): qty 1

## 2016-04-24 NOTE — Telephone Encounter (Signed)
I called dr. Maylene Roes re: pts orthostatic hypotension and supine hypertension. Challenging clinical situation. Advised to continue current treatments with carb/levo 1 tab TID and hydrocortisone tabs for suspected adrenal insufficiency. Cannot use midodrine or droxidopa due to supine hypertension currently. Hopefully as pt recovers, then these meds may be considered if supine hypertension improves.   Penni Bombard, MD 0000000, [id number] PM Certified in Neurology, Neurophysiology and Neuroimaging  [facility] [address] [place], [place] [address] [phone]

## 2016-04-24 NOTE — Progress Notes (Signed)
PROGRESS NOTE    Lawrence Turner  [id number] DOB: 06/07/1926 DOA: 04/18/2016 PCP: Jeanmarie Hubert, MD   Brief Narrative:  Lawrence Turner is a 82 y.o. male with Parkinson's disease, hypertension who has been experiencing episodes of staring spells and loss of consciousness. He states this has been going on for 2 weeks. He states that he does not feel the episodes before they happen. He is not sure if he loses consciousness during the episodes but is told by witnesses that he has staring spells, confusion, and sometimes loss of consciousness. He denies as vision loss, focal weakness, loss of control of bladder or bowel, tongue biting. He does not believe he has hit his head. He followed up with his neurologist Dr. Jannifer Franklin in office on 8/30 for evaluation for these episodes. During his appointment, he had another episode where he was observed to be dyspneic, decreased responsiveness, and his orthostatic BP showed a 50 point drop from sitting to standing. Dr. Jannifer Franklin ordered 2D echo as well as CXR. However, patient had another episode at home which prompted his son to bring patient into the ED. ED physician discussed with on-call neurologist Dr. Leonel Ramsay who recommended EEG as well.   During hospitalization, he has been diagnosed with adrenal insufficency. Case was discussed over the phone with endocrinology and patient was started on cortef 20mg  in morning, 10mg  in evening. Patient continued to have significant orthostatic hypotension. His home medications remeron and sinemet which can have side effect of orthostatic hypotension were stopped. With stopped sinemet, his tremor significantly worsened, so this was added back at a lower dose. ACTH resulted low, consistent with secondary adrenal insufficiency. Patient was started on Florinef 0.1mg  as his orthostatic hypotension did not improve.   Assessment & Plan:   Principal Problem:   Secondary adrenal insufficiency (HCC) Active Problems:   Uncontrolled  hypertension   Peripheral neuropathy (HCC)   Normocytic anemia   Parkinson's disease (HCC)   Constipation   Ulcer of foot (HCC)   Orthostatic hypotension   Syncope   Pressure ulcer stage II  Secondary adrenal insufficiency  ACTH stim test completed   ACTH level 1.3  Need to follow up with Dr. Elayne Snare [facility] endocrine) on discharge   Cortef 20mg  in morning, 10mg  in evening  Spoke with Dr. Dwyane Dee again today as patient's orthostatic hypotension has not improved. Start Florinef 0.1mg  daily - monitor BP as patient has supine hypertension as well    Ordered prolactin, TSH, T4, IGF, Lh, testosterone - if these are abnormal, consider MRI pituitary  Orthostatic hypotension   Tele showing NSR with PACs, EKG NSR rate 90  EEG - normal   Echo - normal   Hold remeron  Lower dose Sinement 1 tab TID (home dose is 1.5 tab TID)   TED hose   I suspect this is due to a combination of factors including adrenal insufficiency, medication side effect, dehydration   Parkinson's disease  Follows with Dr. Jannifer Franklin, neurology  Spoke with Dr. Leta Baptist ([facility]) today   Continue lower dose Sinement 1 tab TID (home dose is 1.5 tab TID)   AKI, possibly dehydration - resolved. Now at baseline   UA negative  Monitor BMP  Chronic normocytic anemia  Monitor CBC  Left foot ulcer, present on admission   Clean, non draining on exam  Consult wound   Pressure ulcer stage 2, posterior coccyx, present on admission per family   Continue conservative management, off loading as much as possible   Turn every  2 hours   Consult wound   Constipation  Bowel regimen   DVT prophylaxis: Lovenox Code Status: DNR Family Communication: Spoke with son [name] at length over the phone today [phone]  Disposition Plan: Complicated management as patient has significant supine hypertension with symptomatic orthostatic hypotension with underlying adrenal insufficency and Parkinsonism. Discharge timing  unclear.    Consultants:   None  Procedures:   None   Antimicrobials:   None    Subjective: Patient has not had any acute episodes of events overnight. Orthostatic BP this morning was 141/73 lying, 86/52 sitting, 81/42 standing. He denies any headaches, further staring spells, dizziness, chest pain, shortness of breath, abdominal pain, nausea, vomiting. His tremors are slightly better today on the lower dose of sinemet, but not to baseline. He also complains of significant dizziness with ambulation.    Objective: Vitals:   04/23/16 1606 04/23/16 2058 04/24/16 0537 04/24/16 1024  BP: (!) 148/79 (!) 155/84 (!) 198/92 (!) 141/73  Pulse: 83 87 78 100  Resp: 16 18 20 18   Temp:  97.4 F (36.3 C) 98 F (36.7 C) 98.7 F (37.1 C)  TempSrc:  Oral Oral   SpO2: 99% 100% 100% 100%  Weight:      Height:        Intake/Output Summary (Last 24 hours) at 04/24/16 1407 Last data filed at 04/24/16 1331  Gross per 24 hour  Intake              840 ml  Output             1050 ml  Net             -210 ml   Filed Weights   04/21/16 0500 04/22/16 0500 04/23/16 0502  Weight: 62.5 kg (137 lb 11.2 oz) 60.7 kg (133 lb 14.4 oz) 60.6 kg (133 lb 9.6 oz)    Examination:  General exam: Appears calm and comfortable  Respiratory system: Clear to auscultation. Respiratory effort normal. Cardiovascular system: S1 & S2 heard, RRR. No JVD, murmurs, rubs, gallops or clicks. No pedal edema. Gastrointestinal system: Abdomen is nondistended, soft and nontender. No organomegaly or masses felt. Normal bowel sounds heard. Central nervous system: Alert and oriented. CN 2-12 grossly in tact, finger-nose symmetric and appropriate, answers all questions appropriately, moves all extremities spontaneously. Resting and intention tremor bilateral upper extremities  Extremities: Symmetric 5 x 5 power.  Skin: No rashes. Small clean ulcer on plantar surface left 5th metatarsal head - present on admission  Psychiatry:  Judgement and insight appear normal. Mood & affect appropriate.   Data Reviewed: I have personally reviewed following labs and imaging studies  CBC:  Recent Labs Lab 04/20/16 0643 04/21/16 0411 04/22/16 0523 04/23/16 0357 04/24/16 0654  WBC 9.1 9.1 9.4 9.3 10.0  NEUTROABS 6.9 7.3 7.5 7.2 8.0*  HGB 10.5* 10.8* 10.2* 10.4* 10.7*  HCT 33.1* 32.8* 31.5* 31.0* 32.6*  MCV 96.5 94.8 95.2 94.2 94.8  PLT 168 163 161 164 [id number]*   Basic Metabolic Panel:  Recent Labs Lab 04/19/16 0434 04/20/16 0643 04/21/16 0411 04/22/16 0523 04/23/16 0357 04/24/16 0654  NA 141 138 138 135 136 137  K 4.1 4.1 4.0 4.0 3.7 3.9  CL 111 114* 112* 109 109 110  CO2 22 20* 18* 21* 22 21*  GLUCOSE 96 86 104* 103* 117* 90  BUN 26* 24* 24* 27* 32* 29*  CREATININE 1.16 0.97 0.96 1.06 1.19 0.96  CALCIUM 9.0 8.4* 8.9 8.6* 8.5* 8.8*  MG  2.1  --   --   --   --   --    GFR: Estimated Creatinine Clearance: 39.7 mL/min (by C-G formula based on SCr of 0.96 mg/dL). Liver Function Tests:  Recent Labs Lab 04/18/16 1659 04/18/16 2232 04/19/16 0434  AST 17 24 23   ALT 13 9* 19  ALKPHOS 46 44 43  BILITOT 0.3 0.3 0.4  PROT 6.0 6.1* 5.9*  ALBUMIN 3.4* 3.3* 3.3*   No results for input(s): LIPASE, AMYLASE in the last 168 hours. No results for input(s): AMMONIA in the last 168 hours. Coagulation Profile: No results for input(s): INR, PROTIME in the last 168 hours. Cardiac Enzymes:  Recent Labs Lab 04/19/16 0620 04/19/16 1538  TROPONINI <0.03 <0.03   BNP (last 3 results) No results for input(s): PROBNP in the last 8760 hours. HbA1C: No results for input(s): HGBA1C in the last 72 hours. CBG:  Recent Labs Lab 04/20/16 0753 04/21/16 0523 04/22/16 0455 04/23/16 0458 04/24/16 0537  GLUCAP 91 100* 105* 112* 98   Lipid Profile: No results for input(s): CHOL, HDL, LDLCALC, TRIG, CHOLHDL, LDLDIRECT in the last 72 hours. Thyroid Function Tests: No results for input(s): TSH, T4TOTAL, FREET4, T3FREE,  THYROIDAB in the last 72 hours. Anemia Panel: No results for input(s): VITAMINB12, FOLATE, FERRITIN, TIBC, IRON, RETICCTPCT in the last 72 hours. Sepsis Labs: No results for input(s): PROCALCITON, LATICACIDVEN in the last 168 hours.  Recent Results (from the past 240 hour(s))  MRSA PCR Screening     Status: None   Collection Time: 04/19/16  4:04 AM  Result Value Ref Range Status   MRSA by PCR NEGATIVE NEGATIVE Final    Comment:        The GeneXpert MRSA Assay (FDA approved for NASAL specimens only), is one component of a comprehensive MRSA colonization surveillance program. It is not intended to diagnose MRSA infection nor to guide or monitor treatment for MRSA infections.          Radiology Studies: No results found.      Scheduled Meds: . carbidopa-levodopa  1 tablet Oral TID  . enoxaparin (LOVENOX) injection  40 mg Subcutaneous Q24H  . famotidine  20 mg Oral Daily  . feeding supplement (PRO-STAT SUGAR FREE 64)  30 mL Oral BID  . [START ON 04/25/2016] fludrocortisone  0.1 mg Oral Daily  . hydrocortisone  10 mg Oral q1800  . hydrocortisone  20 mg Oral q morning - 10a  . megestrol  800 mg Oral Daily  . multivitamin with minerals  1 tablet Oral Daily  . mupirocin ointment  1 application Nasal BID  . psyllium  1 packet Oral TID  . sodium chloride flush  3 mL Intravenous Q12H  . vitamin B-12  1,000 mcg Oral Daily   Continuous Infusions: . sodium chloride       LOS: 5 days    Time spent: 50 minutes     Shon Millet, DO [facility] Pager [phone]  If 7PM-7AM, please contact night-coverage [email or web address] Password [email or web address] 04/24/2016, 2:07 PM

## 2016-04-24 NOTE — Telephone Encounter (Signed)
Dr Kelton Pillar [phone] called to speak with Dr Jannifer Franklin about this pt. I advised her Dr Viona Gilmore is out of the office until tomorrow. She asked if another provider could speak with her today about this pt (she wa admitted to the hospital 8.30.17) I advised the w-in provider would call her. Thank you

## 2016-04-24 NOTE — Care Management Important Message (Signed)
Important Message  Patient Details  Name: Lawrence Turner MRN: [id number] Date of Birth: November 20, 1925   Medicare Important Message Given:  Yes    Doree Kuehne Abena 04/24/2016, 5:07 PM

## 2016-04-25 DIAGNOSIS — E2749 Other adrenocortical insufficiency: Principal | ICD-10-CM

## 2016-04-25 LAB — GLUCOSE, CAPILLARY
GLUCOSE-CAPILLARY: 79 mg/dL (ref 65–99)
Glucose-Capillary: 155 mg/dL — ABNORMAL HIGH (ref 65–99)

## 2016-04-25 LAB — LUTEINIZING HORMONE: LH: 3.7 m[IU]/mL (ref 1.7–8.6)

## 2016-04-25 LAB — TESTOSTERONE,FREE AND TOTAL
TESTOSTERONE FREE: 1.2 pg/mL — AB (ref 6.6–18.1)
TESTOSTERONE: 27 ng/dL — AB (ref 264–916)

## 2016-04-25 LAB — INSULIN-LIKE GROWTH FACTOR: Somatomedin C: 68 ng/mL (ref 32–166)

## 2016-04-25 LAB — PROLACTIN: PROLACTIN: 12.2 ng/mL (ref 4.0–15.2)

## 2016-04-25 MED ORDER — HYDRALAZINE HCL 20 MG/ML IJ SOLN
5.0000 mg | Freq: Once | INTRAMUSCULAR | Status: AC
  Administered 2016-04-25: 5 mg via INTRAVENOUS
  Filled 2016-04-25: qty 1

## 2016-04-25 NOTE — Progress Notes (Signed)
Physical Therapy Treatment Patient Details Name: Lawrence Turner MRN: [id number] DOB: [birth year] Today's Date: 04/25/2016    History of Present Illness  Lawrence Turner is a 82 y.o. male with Parkinson's disease, hypertension has been experiencing episodes of staring spells and loss of consciousness  Work up includes orthostatics and tachycardia.    PT Comments    Steady improvement.  BP higher at 110/61, asymptomatic.  Emphasis on unassisted mobility to bathroom and ambulation in the halls x 2 trials.   Follow Up Recommendations  Home health PT;Other (comment)     Equipment Recommendations  None recommended by PT    Recommendations for Other Services       Precautions / Restrictions Precautions Precautions: Fall    Mobility  Bed Mobility Overal bed mobility: Needs Assistance             General bed mobility comments: out of bed in recliner  Transfers Overall transfer level: Needs assistance   Transfers: Sit to/from Stand Sit to Stand: Min assist         General transfer comment: no assist to come up from recliner, but guarded due to pt's difficulty coming forward.  Assisted from lower toilet.  Ambulation/Gait Ambulation/Gait assistance: Min guard Ambulation Distance (Feet): 160 Feet (x2) Assistive device: Rolling walker (2 wheeled) Gait Pattern/deviations: Step-through pattern Gait velocity: slow to moderate. Gait velocity interpretation: Below normal speed for age/gender General Gait Details: low amplitude, steps with minimally bent knees in stance. mild festination.   Stairs            Wheelchair Mobility    Modified Rankin (Stroke Patients Only)       Balance Overall balance assessment: Needs assistance   Sitting balance-Leahy Scale: Good       Standing balance-Leahy Scale: Fair Standing balance comment: stood without assist or AD while taking standing BP                    Cognition Arousal/Alertness: Awake/alert Behavior During  Therapy: Flat affect Overall Cognitive Status: Within Functional Limits for tasks assessed                      Exercises      General Comments General comments (skin integrity, edema, etc.): Standing BP during gait was 110/61      Pertinent Vitals/Pain Pain Assessment: No/denies pain    Home Living                      Prior Function            PT Goals (current goals can now be found in the care plan section) Acute Rehab PT Goals Patient Stated Goal: back able to do for myself PT Goal Formulation: With patient Time For Goal Achievement: 04/26/16 Potential to Achieve Goals: Good Progress towards PT goals: Progressing toward goals    Frequency  Min 3X/week    PT Plan Current plan remains appropriate    Co-evaluation             End of Session   Activity Tolerance: Patient tolerated treatment well Patient left: in chair;with call bell/phone within reach;with chair alarm set     Time: 1430-1451 PT Time Calculation (min) (ACUTE ONLY): 21 min  Charges:  $Gait Training: 8-22 mins                    G Codes:      Riata Ikeda, Tessie Fass 04/25/2016, 2:57  PM  04/25/2016  Donnella Sham, [place] [phone]  (pager)

## 2016-04-25 NOTE — Progress Notes (Addendum)
PROGRESS NOTE    Lawrence Turner  [id number] DOB: [birth year] DOA: 04/18/2016 PCP: Jeanmarie Hubert, MD   Brief Narrative:  Lawrence Turner is a 82 y.o. male with Parkinson's disease, hypertension who has been experiencing episodes of staring spells and loss of consciousness. He states this has been going on for 2 weeks. He states that he does not feel the episodes before they happen. He is not sure if he loses consciousness during the episodes but is told by witnesses that he has staring spells, confusion, and sometimes loss of consciousness. He denies as vision loss, focal weakness, loss of control of bladder or bowel, tongue biting. He does not believe he has hit his head. He followed up with his neurologist Dr. Jannifer Franklin in office on 8/30 for evaluation for these episodes. During his appointment, he had another episode where he was observed to be dyspneic, decreased responsiveness, and his orthostatic BP showed a 50 point drop from sitting to standing. Dr. Jannifer Franklin ordered 2D echo as well as CXR. However, patient had another episode at home which prompted his son to bring patient into the ED. ED physician discussed with on-call neurologist Dr. Leonel Ramsay who recommended EEG as well.   During hospitalization, he has been diagnosed with adrenal insufficency. Case was discussed over the phone with endocrinology and patient was started on cortef 20mg  in morning, 10mg  in evening. Patient continued to have significant orthostatic hypotension. His home medications remeron and sinemet which can have side effect of orthostatic hypotension were stopped. With stopped sinemet, his tremor significantly worsened, so this was added back at a lower dose. ACTH resulted low, consistent with secondary adrenal insufficiency. Patient was started on Florinef 0.1mg  as his orthostatic hypotension did not improve.   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 Dwyane Dee again 9/5 as patient's orthostatic hypotension has not improved, he recommended Florinef 0.1mg  daily - monitor BP as patient has supine hypertension as well   -prolactin, TSH, T4, IGF, Lh, these are WNL, testosterone level is low -needs close FU with Dr.Kumar  Parkinson's disease -Follows with Dr. Jannifer Franklin, neurology -Dr.CHoi d/w Dr. Leta Baptist ([facility]) 9/5 -Continue lower dose Sinement 1 tab TID (home dose is 1.5 tab TID)   AKI, possibly dehydration  - resolved. Now at baseline   Chronic normocytic anemia -stable  Left foot ulcer, present on admission  -topical wound care  Pressure ulcer stage 2, posterior coccyx, present on admission per family  -wound care  Constipation  Bowel regimen   DVT prophylaxis: Lovenox Code Status: DNR Family Communication: called and d/w son [name] Disposition Plan: Complicated management as patient has significant supine hypertension with symptomatic orthostatic hypotension with underlying adrenal insufficency and Parkinsonism, back to ALF soon   Consultants:   D/w NEurology  Procedures:   None   Antimicrobials:   None    Subjective: Ok some unsteadiness, not very  dizzy this am  Objective: Vitals:   04/25/16 0252 04/25/16 0253 04/25/16 0544 04/25/16 1003  BP: (!) 198/100 (!) 178/91 (!) 153/68 114/64  Pulse: 79 75 88  (!) 102  Resp: 18  18 18   Temp: 98.1 F (36.7 C)  97.7 F (36.5 C) 98.6 F (37 C)  TempSrc: Oral  Oral Oral  SpO2: 95% 97% 95% 100%  Weight:   62.1 kg (136 lb 14.4 oz)   Height:        Intake/Output Summary (Last 24 hours) at 04/25/16 1316 Last data filed at 04/25/16 0936  Gross per 24 hour  Intake          1598.33 ml  Output              575 ml  Net          1023.33 ml   Filed Weights   04/22/16 0500 04/23/16 0502 04/25/16 0544  Weight: 60.7 kg (133 lb 14.4 oz) 60.6 kg (133 lb 9.6 oz) 62.1 kg (136 lb 14.4 oz)    Examination:  General exam: Appears calm and comfortable  Respiratory system: Clear to auscultation. Respiratory effort normal. Cardiovascular system: S1 & S2 heard, RRR. No JVD, murmurs, rubs, gallops or clicks. No pedal edema. Gastrointestinal system: Abdomen is nondistended, soft and nontender. No organomegaly or masses felt. Normal bowel sounds heard. Central nervous system: Alert and oriented. CN 2-12 grossly in tact, finger-nose symmetric and appropriate, answers all questions appropriately, moves all extremities spontaneously. Resting and intention tremor bilateral upper extremities  Extremities: Symmetric 5 x 5 power.  Skin: No rashes. Small clean ulcer on plantar surface left 5th metatarsal head - present on admission  Psychiatry: Judgement and insight appear normal. Mood & affect appropriate.   Data Reviewed: I have personally reviewed following labs and imaging studies  CBC:  Recent Labs Lab 04/20/16 0643 04/21/16 0411 04/22/16 0523 04/23/16 0357 04/24/16 0654  WBC 9.1 9.1 9.4 9.3 10.0  NEUTROABS 6.9 7.3 7.5 7.2 8.0*  HGB 10.5* 10.8* 10.2* 10.4* 10.7*  HCT 33.1* 32.8* 31.5* 31.0* 32.6*  MCV 96.5 94.8 95.2 94.2 94.8  PLT 168 163 161 164 123456*   Basic Metabolic Panel:  Recent Labs Lab 04/19/16 0434 04/20/16 0643 04/21/16 0411 04/22/16 0523 04/23/16 0357 04/24/16 0654  NA 141 138 138 135 136 137  K 4.1 4.1 4.0 4.0 3.7 3.9  CL 111 114*  112* 109 109 110  CO2 22 20* 18* 21* 22 21*  GLUCOSE 96 86 104* 103* 117* 90  BUN 26* 24* 24* 27* 32* 29*  CREATININE 1.16 0.97 0.96 1.06 1.19 0.96  CALCIUM 9.0 8.4* 8.9 8.6* 8.5* 8.8*  MG 2.1  --   --   --   --   --    GFR: Estimated Creatinine Clearance: 39.7 mL/min (by C-G formula based on SCr of 0.96 mg/dL). Liver Function Tests:  Recent Labs Lab 04/18/16 1659 04/18/16 2232 04/19/16 0434  AST 17 24 23   ALT 13 9* 19  ALKPHOS 46 44 43  BILITOT 0.3 0.3 0.4  PROT 6.0 6.1* 5.9*  ALBUMIN 3.4* 3.3* 3.3*   No results for input(s): LIPASE, AMYLASE in the last 168 hours. No results for input(s): AMMONIA in the last 168 hours. Coagulation Profile: No results for input(s): INR, PROTIME in the last 168 hours. Cardiac Enzymes:  Recent Labs Lab 04/19/16 0620 04/19/16 1538  TROPONINI <0.03 <0.03   BNP (last 3 results) No results for input(s): PROBNP in the last 8760  hours. HbA1C: No results for input(s): HGBA1C in the last 72 hours. CBG:  Recent Labs Lab 04/23/16 0458 04/24/16 0537 04/24/16 2020 04/25/16 0542 04/25/16 0751  GLUCAP 112* 98 164* 155* 79   Lipid Profile: No results for input(s): CHOL, HDL, LDLCALC, TRIG, CHOLHDL, LDLDIRECT in the last 72 hours. Thyroid Function Tests:  Recent Labs  04/24/16 1517  TSH 2.297  FREET4 1.33*   Anemia Panel: No results for input(s): VITAMINB12, FOLATE, FERRITIN, TIBC, IRON, RETICCTPCT in the last 72 hours. Sepsis Labs: No results for input(s): PROCALCITON, LATICACIDVEN in the last 168 hours.  Recent Results (from the past 240 hour(s))  MRSA PCR Screening     Status: None   Collection Time: 04/19/16  4:04 AM  Result Value Ref Range Status   MRSA by PCR NEGATIVE NEGATIVE Final    Comment:        The GeneXpert MRSA Assay (FDA approved for NASAL specimens only), is one component of a comprehensive MRSA colonization surveillance program. It is not intended to diagnose MRSA infection nor to guide or monitor  treatment for MRSA infections.          Radiology Studies: No results found.      Scheduled Meds: . carbidopa-levodopa  1 tablet Oral TID  . enoxaparin (LOVENOX) injection  40 mg Subcutaneous Q24H  . famotidine  20 mg Oral Daily  . feeding supplement (PRO-STAT SUGAR FREE 64)  30 mL Oral BID  . fludrocortisone  0.1 mg Oral Daily  . hydrocortisone  10 mg Oral q1800  . hydrocortisone  20 mg Oral q morning - 10a  . megestrol  800 mg Oral Daily  . multivitamin with minerals  1 tablet Oral Daily  . mupirocin ointment  1 application Nasal BID  . psyllium  1 packet Oral TID  . sodium chloride flush  3 mL Intravenous Q12H  . vitamin B-12  1,000 mcg Oral Daily   Continuous Infusions:     LOS: 6 days    Time spent: 40 minutes     Domenic Polite, MD [facility] Pager [phone]  If 7PM-7AM, please contact night-coverage [email or web address] Password [email or web address] 04/25/2016, 1:16 PM

## 2016-04-25 NOTE — Progress Notes (Signed)
Nutrition Follow-up  DOCUMENTATION CODES:   Not applicable  INTERVENTION:   -Continue 30 ml Prostat BID, each supplement will provide 100 kcals and 15 grams protein -Continue MVI daily  NUTRITION DIAGNOSIS:   Increased nutrient needs related to wound healing as evidenced by estimated needs.  Ongoing  GOAL:   Patient will meet greater than or equal to 90% of their needs  Progressing  MONITOR:   PO intake, Supplement acceptance, Labs, Weight trends, Skin, I & O's  REASON FOR ASSESSMENT:   Malnutrition Screening Tool    ASSESSMENT:   Lawrence Turner is a 82 y.o. male with Parkinson's disease, hypertension has been experiencing episodes of staring spells and loss of consciousness. Patient had followed up with Dr. Jannifer Turner neurologist yesterday for possible seizure and had a similar episode at the neurologist office where patient had a sudden loss of consciousness and was found to have blood pressure systolic in the [id number].   Pt resting in recliner at time of RD visit with lunch tray in front of him.   Per chart review, pt positive for adrenal insufficiency per MD.   Pt continues to have very good appetite. Meal completion 50-100%. Pt also continues to accept Prostat supplements per [facility].   Reviewed CWOCN note from 04/23/16; pt remains with stage II to sacrum/coccyx.   CSW following; plan to d/c back to [facility] one medically stable.   Labs reviewed: CBGS: 79-164.  Diet Order:  Diet Heart Room service appropriate? Yes; Fluid consistency: Thin  Skin:  Wound (see comment) (stage II coccyx)  Last BM:  04/24/16  Height:   Ht Readings from Last 1 Encounters:  04/19/16 5' 1.2" (1.554 m)    Weight:   Wt Readings from Last 1 Encounters:  04/25/16 136 lb 14.4 oz (62.1 kg)    Ideal Body Weight:  50.9 kg  BMI:  Body mass index is 25.7 kg/m.  Estimated Nutritional Needs:   Kcal:  1550-1750  Protein:  60-75 grams  Fluid:  1.5-1.7 L  EDUCATION NEEDS:   No  education needs identified at this time  Lawrence Turner Lawrence Turner, RD, LDN, CDE Pager: [phone] After hours Pager: [phone]

## 2016-04-26 ENCOUNTER — Telehealth: Payer: Self-pay | Admitting: Neurology

## 2016-04-26 LAB — BASIC METABOLIC PANEL
Anion gap: 6 (ref 5–15)
BUN: 27 mg/dL — AB (ref 6–20)
CALCIUM: 8.6 mg/dL — AB (ref 8.9–10.3)
CO2: 21 mmol/L — ABNORMAL LOW (ref 22–32)
CREATININE: 0.96 mg/dL (ref 0.61–1.24)
Chloride: 110 mmol/L (ref 101–111)
GFR calc Af Amer: >60 mL/min (ref >60)
GLUCOSE: 105 mg/dL — AB (ref 65–99)
Potassium: 3.7 mmol/L (ref 3.5–5.1)
SODIUM: 137 mmol/L (ref 135–145)

## 2016-04-26 LAB — CBC
HCT: 30.2 % — ABNORMAL LOW (ref 39.0–52.0)
Hemoglobin: 9.8 g/dL — ABNORMAL LOW (ref 13.0–17.0)
MCH: 30.9 pg (ref 26.0–34.0)
MCHC: 32.5 g/dL (ref 30.0–36.0)
MCV: 95.3 fL (ref 78.0–100.0)
PLATELETS: 156 10*3/uL (ref 150–400)
RBC: 3.17 MIL/uL — ABNORMAL LOW (ref 4.22–5.81)
RDW: 15.2 % (ref 11.5–15.5)
WBC: 9.1 10*3/uL (ref 4.0–10.5)

## 2016-04-26 LAB — GLUCOSE, CAPILLARY: Glucose-Capillary: 102 mg/dL — ABNORMAL HIGH (ref 65–99)

## 2016-04-26 MED ORDER — CARBIDOPA-LEVODOPA 25-100 MG PO TABS
0.5000 | ORAL_TABLET | Freq: Three times a day (TID) | ORAL | Status: DC
Start: 2016-04-26 — End: 2016-04-30
  Administered 2016-04-26 – 2016-04-30 (×13): 0.5 via ORAL
  Filled 2016-04-26 (×13): qty 1

## 2016-04-26 NOTE — Care Management Important Message (Signed)
Important Message  Patient Details  Name: Lawrence Turner MRN: [id number] Date of Birth: 1926/06/21   Medicare Important Message Given:  Yes    Nathen May 04/26/2016, 10:47 AM

## 2016-04-26 NOTE — Progress Notes (Signed)
CM notified by attending of drop in BP this AM, patient unstable for DC at this time. CM consulted CSW to assess availability of SNF placement at [facility] where patient is currently a resident at the ALF level of care.

## 2016-04-26 NOTE — Telephone Encounter (Signed)
I talk with Dr. Jacinta Shoe. 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 Even when sitting, the blood pressure seems to vary widely. They have started him on Florinef, they have reduce the Sinemet taking the 25/100 mg tablet 3 times daily. The patient has been given IV fluid hydration.  We discussed reducing the Sinemet further to 0.5 mg 3 times daily. The patient will need compression stockings, may consider addition of low-dose Mestinon, and always keep the bed at 30 or more while lying down, never let him lie down flat.  Orthostatic hypotension can be disabling and some patients with Parkinson's disease or multisystem atrophy.

## 2016-04-26 NOTE — Telephone Encounter (Signed)
Dr Jacinta Shoe Joseph/[facility] [phone] request Dr Viona Gilmore to call at his earliest convenience.

## 2016-04-26 NOTE — Progress Notes (Signed)
PROGRESS NOTE    Lawrence Turner  [id number] DOB: 1926/07/29 DOA: 04/18/2016 PCP: Jeanmarie Hubert, MD   Brief Narrative:  Lawrence Turner is a 82 y.o. male with Parkinson's disease, hypertension who has been experiencing episodes of staring spells and loss of consciousness. He states this has been going on for 2 weeks. He states that he does not feel the episodes before they happen. He is not sure if he loses consciousness during the episodes but is told by witnesses that he has staring spells, confusion, and sometimes loss of consciousness. He denies as vision loss, focal weakness, loss of control of bladder or bowel, tongue biting. He does not believe he has hit his head. He followed up with his neurologist Dr. Jannifer Franklin in office on 8/30 for evaluation for these episodes. During his appointment, he had another episode where he was observed to be dyspneic, decreased responsiveness, and his orthostatic BP showed a 50 point drop from sitting to standing. Dr. Jannifer Franklin ordered 2D echo as well as CXR. However, patient had another episode at home which prompted his son to bring patient into the ED. ED physician discussed with on-call neurologist Dr. Leonel Ramsay who recommended EEG as well.   During hospitalization, he has been diagnosed with adrenal insufficency. Case was discussed over the phone with endocrinology and patient was started on cortef 20mg  in morning, 10mg  in evening. Patient continued to have significant orthostatic hypotension. His home medications remeron and sinemet which can have side effect of orthostatic hypotension were stopped. With stopped sinemet, his tremor significantly worsened, so this was added back at a lower dose. ACTH resulted low, consistent with secondary adrenal insufficiency. Patient was started on Florinef 0.1mg  as his orthostatic hypotension did not improve.   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-given wide fluctuations in BP sometimes positional and sometimes not -Tele showing NSR with PACs, EKG NSR rate 90 -EEG - normal, Echo - normal  -started on Hydrocortisone per Endocrine recs on 9/1 -after d/w Neuro Dr.Penumalli lowered dose Sinement 1 tab TID (home dose is 1.5 tab TID) on 9/4 -TED hose  -started on 0.1mg  Florinef 9/5 -continues to have Wide fluctuations in BP, [id number] to 80 systolic this am (supine and sitting): suspect Autonomic neuropathy playing a significant role, will ask Neurology for input given limited success with current Rx   Secondary adrenal insufficiency -Random cortisol was 1.2 -ACTH stim test completed, ACTH level low at 1.3 -Dr.CHoi discussed with Endocrine Dr.Kumar x2, started on hydrocortisone 20mg  in morning, 10mg  in evening -she d/w Dr. Dwyane Dee again 9/5 as patient's orthostatic hypotension has not improved, he recommended Florinef 0.1mg  daily - monitor BP as patient has supine hypertension as well   -prolactin, TSH, T4, IGF, Lh, these are WNL, testosterone level is low -needs close FU with Dr.Kumar  Parkinson's disease -Follows with Dr. Jannifer Franklin, neurology -Dr.CHoi d/w Dr. Leta Baptist ([facility]) 9/5 -Continue lower dose Sinement 1 tab TID (home dose is 1.5 tab TID)  -left msg for Dr.Willis this am  AKI, possibly dehydration  - resolved. Now at baseline   Chronic normocytic anemia -stable  Left foot ulcer, present on admission  -topical wound care  Pressure ulcer stage 2, posterior coccyx, present on admission per family  -wound care  Constipation  Bowel regimen   DVT prophylaxis: Lovenox Code Status: DNR Family Communication: called and d/w son [name] 9/7 and 9/6 Disposition Plan: Complicated management as patient has significant supine  hypertension with symptomatic orthostatic hypotension with underlying adrenal insufficency and Parkinsonism, SNF when stable   Consultants:   D/w  NEurology  Procedures:   None   Antimicrobials:   None    Subjective: Ok some unsteadiness, not very dizzy this am, walked in halls yesterday  Objective: Vitals:   04/25/16 2125 04/25/16 2138 04/26/16 0549 04/26/16 1047  BP: (!) 195/99 (!) 151/78 (!) 196/89 (!) 80/47  Pulse: 81  64 90  Resp: 18  18 18   Temp: 98.2 F (36.8 C)  97.8 F (36.6 C) 97.7 F (36.5 C)  TempSrc: Oral  Oral Oral  SpO2: 100%  99% 99%  Weight:      Height:        Intake/Output Summary (Last 24 hours) at 04/26/16 1123 Last data filed at 04/26/16 0837  Gross per 24 hour  Intake              243 ml  Output              650 ml  Net             -407 ml   Filed Weights   04/22/16 0500 04/23/16 0502 04/25/16 0544  Weight: 60.7 kg (133 lb 14.4 oz) 60.6 kg (133 lb 9.6 oz) 62.1 kg (136 lb 14.4 oz)    Examination:  General exam: Appears calm and comfortable, flat affect Respiratory system: Clear to auscultation. Respiratory effort normal. Cardiovascular system: S1 & S2 heard, RRR. No JVD, murmurs, rubs, gallops or clicks. No pedal edema. Gastrointestinal system: Abdomen is nondistended, soft and nontender. No organomegaly or masses felt. Normal bowel sounds heard. Central nervous system: Alert and oriented. CN 2-12 grossly in tact, finger-nose symmetric and appropriate, answers all questions appropriately, moves all extremities spontaneously. Resting and intention tremor bilateral upper extremities  Extremities: Symmetric 5 x 5 power.  Skin: No rashes. Small clean ulcer on plantar surface left 5th metatarsal head - present on admission  Psychiatry: Judgement and insight appear normal. Mood & affect appropriate.   Data Reviewed: I have personally reviewed following labs and imaging studies  CBC:  Recent Labs Lab 04/20/16 0643 04/21/16 0411 04/22/16 0523 04/23/16 0357 04/24/16 0654 04/26/16 0400  WBC 9.1 9.1 9.4 9.3 10.0 9.1  NEUTROABS 6.9 7.3 7.5 7.2 8.0*  --   HGB 10.5* 10.8* 10.2* 10.4*  10.7* 9.8*  HCT 33.1* 32.8* 31.5* 31.0* 32.6* 30.2*  MCV 96.5 94.8 95.2 94.2 94.8 95.3  PLT 168 163 161 164 149* [id number]   Basic Metabolic Panel:  Recent Labs Lab 04/21/16 0411 04/22/16 0523 04/23/16 0357 04/24/16 0654 04/26/16 0400  NA 138 135 136 137 137  K 4.0 4.0 3.7 3.9 3.7  CL 112* 109 109 110 110  CO2 18* 21* 22 21* 21*  GLUCOSE 104* 103* 117* 90 105*  BUN 24* 27* 32* 29* 27*  CREATININE 0.96 1.06 1.19 0.96 0.96  CALCIUM 8.9 8.6* 8.5* 8.8* 8.6*   GFR: Estimated Creatinine Clearance: 39.7 mL/min (by C-G formula based on SCr of 0.96 mg/dL). Liver Function Tests: No results for input(s): AST, ALT, ALKPHOS, BILITOT, PROT, ALBUMIN in the last 168 hours. No results for input(s): LIPASE, AMYLASE in the last 168 hours. No results for input(s): AMMONIA in the last 168 hours. Coagulation Profile: No results for input(s): INR, PROTIME in the last 168 hours. Cardiac Enzymes:  Recent Labs Lab 04/19/16 1538  TROPONINI <0.03   BNP (last 3 results) No results for input(s): PROBNP in the last 8760  hours. HbA1C: No results for input(s): HGBA1C in the last 72 hours. CBG:  Recent Labs Lab 04/24/16 0537 04/24/16 2020 04/25/16 0542 04/25/16 0751 04/26/16 0810  GLUCAP 98 164* 155* 79 102*   Lipid Profile: No results for input(s): CHOL, HDL, LDLCALC, TRIG, CHOLHDL, LDLDIRECT in the last 72 hours. Thyroid Function Tests:  Recent Labs  04/24/16 1517  TSH 2.297  FREET4 1.33*   Anemia Panel: No results for input(s): VITAMINB12, FOLATE, FERRITIN, TIBC, IRON, RETICCTPCT in the last 72 hours. Sepsis Labs: No results for input(s): PROCALCITON, LATICACIDVEN in the last 168 hours.  Recent Results (from the past 240 hour(s))  MRSA PCR Screening     Status: None   Collection Time: 04/19/16  4:04 AM  Result Value Ref Range Status   MRSA by PCR NEGATIVE NEGATIVE Final    Comment:        The GeneXpert MRSA Assay (FDA approved for NASAL specimens only), is one component of  a comprehensive MRSA colonization surveillance program. It is not intended to diagnose MRSA infection nor to guide or monitor treatment for MRSA infections.          Radiology Studies: No results found.      Scheduled Meds: . carbidopa-levodopa  1 tablet Oral TID  . enoxaparin (LOVENOX) injection  40 mg Subcutaneous Q24H  . famotidine  20 mg Oral Daily  . feeding supplement (PRO-STAT SUGAR FREE 64)  30 mL Oral BID  . fludrocortisone  0.1 mg Oral Daily  . hydrocortisone  10 mg Oral q1800  . hydrocortisone  20 mg Oral q morning - 10a  . megestrol  800 mg Oral Daily  . multivitamin with minerals  1 tablet Oral Daily  . mupirocin ointment  1 application Nasal BID  . psyllium  1 packet Oral TID  . sodium chloride flush  3 mL Intravenous Q12H  . vitamin B-12  1,000 mcg Oral Daily   Continuous Infusions:     LOS: 7 days    Time spent: 40 minutes     Domenic Polite, MD [facility] Pager [phone]  If 7PM-7AM, please contact night-coverage [email or web address] Password [email or web address] 04/26/2016, 11:23 AM

## 2016-04-27 LAB — GLUCOSE, CAPILLARY: Glucose-Capillary: 102 mg/dL — ABNORMAL HIGH (ref 65–99)

## 2016-04-27 MED ORDER — PSYLLIUM 95 % PO PACK: 1.0000 | PACK | Freq: Three times a day (TID) | ORAL | Status: DC

## 2016-04-27 MED ORDER — FLUDROCORTISONE ACETATE 0.1 MG PO TABS
0.0500 mg | ORAL_TABLET | Freq: Every day | ORAL | Status: DC
  Administered 2016-04-27 – 2016-04-30 (×4): 0.05 mg via ORAL
  Filled 2016-04-27 (×4): qty 0.5

## 2016-04-27 NOTE — Progress Notes (Signed)
Physical Therapy Treatment Patient Details Name: Lawrence Turner MRN: [id number] DOB: [birth year] Today's Date: 04/27/2016    History of Present Illness  Lawrence Turner is a 82 y.o. male with Parkinson's disease, hypertension has been experiencing episodes of staring spells and loss of consciousness  Work up includes orthostatics and tachycardia.    PT Comments    Patient continues with orthostasis with changes in position.  Also with posterior bias at times and high fall risk.  May benefit from SNF rehab at facility upon d/c prior to returning to ALF.  Will follow actuely.  Follow Up Recommendations  SNF     Equipment Recommendations  None recommended by PT    Recommendations for Other Services       Precautions / Restrictions Precautions Precautions: Fall Restrictions Weight Bearing Restrictions: No    Mobility  Bed Mobility Overal bed mobility: Needs Assistance Bed Mobility: Supine to Sit     Supine to sit: Min assist     General bed mobility comments: asks for a hand in lifting trunk, slow and effortful, cues to scoot to EOB  Transfers Overall transfer level: Needs assistance Equipment used: Rolling walker (2 wheeled) Transfers: Sit to/from Stand Sit to Stand: Min assist         General transfer comment: increased time, c/o feeling unsteady so support given  Ambulation/Gait Ambulation/Gait assistance: Min guard Ambulation Distance (Feet): 160 Feet Assistive device: Rolling walker (2 wheeled) Gait Pattern/deviations: Step-through pattern;Decreased stride length;Shuffle;Trunk flexed     General Gait Details: mild drift from walker on turns, cues for safety, assist for balance, c/o SOB with ambulation, feels it is due to his BP   Stairs            Wheelchair Mobility    Modified Rankin (Stroke Patients Only)       Balance Overall balance assessment: Needs assistance         Standing balance support: Bilateral upper extremity supported Standing  balance-Leahy Scale: Poor Standing balance comment: needs UE support for safety, MD reports recent episode falling back into chair from standing                    Cognition Arousal/Alertness: Awake/alert Behavior During Therapy: Flat affect Overall Cognitive Status: Within Functional Limits for tasks assessed                      Exercises      General Comments General comments (skin integrity, edema, etc.): BP seated after ambulation 123/68 HR 94, then standing 95/54 HR 99      Pertinent Vitals/Pain Faces Pain Scale: Hurts a little bit Pain Location: feet Pain Descriptors / Indicators: Burning Pain Intervention(s): Monitored during session    Home Living                      Prior Function            PT Goals (current goals can now be found in the care plan section) Acute Rehab PT Goals Time For Goal Achievement: 05/04/16 Progress towards PT goals: Progressing toward goals    Frequency  Min 3X/week    PT Plan Discharge plan needs to be updated    Co-evaluation             End of Session Equipment Utilized During Treatment: Gait belt Activity Tolerance: Patient limited by fatigue Patient left: in chair;with call bell/phone within reach;with chair alarm set     Time:  [id number] PT Time Calculation (min) (ACUTE ONLY): 26 min  Charges:  $Gait Training: 8-22 mins $Therapeutic Activity: 8-22 mins                    G Codes:      Reginia Naas May 09, 2016, 2:01 PM  Magda Kiel, Charlo 05/09/2016

## 2016-04-27 NOTE — NC FL2 (Signed)
[place] LEVEL OF CARE SCREENING TOOL     IDENTIFICATION  Patient Name: Lawrence Turner Birthdate: 10-09-1925 Sex: male Admission Date (Current Location): 04/18/2016  [facility] and [place] Number:  Herbalist and Address:  [facility], [place] [address], [place], [place] [address]      Provider Number: [id number]  Attending Physician Name and Address:  Domenic Polite, MD  Relative Name and Phone Number:       Current Level of Care: Hospital Recommended Level of Care: [place] Prior Approval Number:    Date Approved/Denied:   PASRR Number:   [id number]   Discharge Plan: SNF    Current Diagnoses: Patient Active Problem List   Diagnosis Date Noted  . Pressure ulcer stage II 04/21/2016  . Secondary adrenal insufficiency ([place]) 04/21/2016  . Syncope 04/19/2016  . Orthostatic hypotension 04/18/2016  . Seizures ([place]) 04/17/2016  . Ulcer of foot ([place]) 04/03/2016  . Weight loss 01/24/2016  . Hammer toe of left foot 10/25/2015  . Abnormality of gait 10/19/2015  . Constipation 10/05/2014  . Corn 10/05/2014  . Parkinson's disease ([place]) 02/02/2014  . Uncontrolled hypertension   . Esophageal reflux   . Peripheral neuropathy ([place])   . Hypertrophy of prostate without urinary obstruction and other lower urinary tract symptoms (LUTS)   . Other B-complex deficiencies   . Normocytic anemia   . Unspecified vitamin D deficiency   . Pure hypercholesterolemia   . Benign neoplasm of colon     Orientation RESPIRATION BLADDER Height & Weight        Normal Continent Weight: 62.1 kg (136 lb 14.4 oz) Height:  5' 1.2" (155.4 cm)  BEHAVIORAL SYMPTOMS/MOOD NEUROLOGICAL BOWEL NUTRITION STATUS      Continent Diet (cardiac)  AMBULATORY STATUS COMMUNICATION OF NEEDS Skin   Limited Assist Verbally PU Stage and Appropriate Care   PU Stage 2 Dressing:  (coccyx)                   Personal Care Assistance Level of Assistance   Bathing, Dressing Bathing Assistance: Limited assistance   Dressing Assistance: Limited assistance     Functional Limitations Info             SPECIAL CARE FACTORS FREQUENCY  PT (By licensed PT), OT (By licensed OT)     PT Frequency: 5/wk OT Frequency: 5/wk            Contractures      Additional Factors Info  Code Status, Allergies Code Status Info: DNR Allergies Info: Lyrica Pregabalin           Current Medications (04/27/2016):  This is the current hospital active medication list Current Facility-Administered Medications  Medication Dose Route Frequency Provider Last Rate Last Dose  . carbidopa-levodopa (SINEMET IR) 25-100 MG per tablet immediate release 0.5 tablet  0.5 tablet Oral TID Domenic Polite, MD   0.5 tablet at 04/27/16 1602  . enoxaparin (LOVENOX) injection 40 mg  40 mg Subcutaneous Q24H Rise Patience, MD   40 mg at 04/27/16 1049  . famotidine (PEPCID) tablet 20 mg  20 mg Oral Daily Rise Patience, MD   20 mg at 04/27/16 1048  . feeding supplement (PRO-STAT SUGAR FREE 64) liquid 30 mL  30 mL Oral BID Rise Patience, MD   30 mL at 04/27/16 1049  . fludrocortisone (FLORINEF) tablet 0.05 mg  0.05 mg Oral Daily Domenic Polite, MD   0.05 mg at 04/27/16  1051  . hydrocortisone (CORTEF) tablet 10 mg  10 mg Oral q1800 Shon Millet, DO   10 mg at 04/26/16 1729  . hydrocortisone (CORTEF) tablet 20 mg  20 mg Oral q morning - 10a Shon Millet, DO   20 mg at 04/27/16 1048  . lactulose (CHRONULAC) 10 GM/15ML solution 40 g  40 g Oral Daily PRN Rise Patience, MD   40 g at 04/19/16 2115  . liver oil-zinc oxide (DESITIN) 40 % ointment 1 application  1 application Topical BID PRN Rise Patience, MD      . megestrol (MEGACE) 400 MG/10ML suspension 800 mg  800 mg Oral Daily Rise Patience, MD   800 mg at 04/27/16 1048  . multivitamin with minerals tablet 1 tablet  1 tablet Oral Daily Rise Patience, MD   1 tablet at  04/27/16 1047  . mupirocin ointment (BACTROBAN) 2 % 1 application  1 application Nasal BID Rise Patience, MD   1 application at [id number]  . polyethylene glycol (MIRALAX / GLYCOLAX) packet 17 g  17 g Oral Daily PRN Rise Patience, MD   17 g at 04/21/16 1631  . psyllium (HYDROCIL/METAMUCIL) packet 1 packet  1 packet Oral TID Shon Millet, DO   1 packet at 04/27/16 1602  . sodium chloride flush (NS) 0.9 % injection 3 mL  3 mL Intravenous Q12H Rise Patience, MD   3 mL at 04/27/16 1050  . vitamin B-12 (CYANOCOBALAMIN) tablet 1,000 mcg  1,000 mcg Oral Daily Rise Patience, MD   1,000 mcg at 04/27/16 1048     Discharge Medications: Please see discharge summary for a list of discharge medications.  Relevant Imaging Results:  Relevant Lab Results:   Additional Information SS#: [id number]  Jorge Ny, LCSW

## 2016-04-27 NOTE — Evaluation (Signed)
Clinical/Bedside Swallow Evaluation Patient Details  Name: Lawrence Turner MRN: [id number] Date of Birth: 11/28/1925  Today's Date: 04/27/2016 Time: SLP Start Time (ACUTE ONLY): [id number] SLP Stop Time (ACUTE ONLY): [id number] SLP Time Calculation (min) (ACUTE ONLY): 16 min  Past Medical History:  Past Medical History:  Diagnosis Date  . Abnormality of gait 10/19/2015  . Anemia, unspecified   . Benign neoplasm of colon    pre-cancerous polyps removed at colonoscopy 2008  . Contact dermatitis and other eczema, due to unspecified cause   . Diverticulosis of colon (without mention of hemorrhage)   . Esophageal reflux   . Hypertrophy of prostate without urinary obstruction and other lower urinary tract symptoms (LUTS)   . Mononeuritis of unspecified site   . Orthostatic hypotension 04/18/2016  . Osteoarthrosis, unspecified whether generalized or localized, pelvic region and thigh   . Other B-complex deficiencies   . Other pulmonary embolism and infarction    following cholecystectomy 2003  . Parkinson disease ([place]) 10/19/2015  . Peripheral neuropathy ([place])   . Pure hypercholesterolemia   . Rosacea   . Seizures ([place]) 04/17/2016  . 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 Syncope and collapse 04/18/2016  . Ulcer of foot ([place]) 04/03/2016  . Unspecified constipation   . Unspecified essential hypertension   . Unspecified gastritis and gastroduodenitis without mention of hemorrhage 08/29/2006  . Unspecified hemorrhoids without mention of complication   . Unspecified vitamin D deficiency    Past Surgical History:  Past Surgical History:  Procedure Laterality Date  . CATARACT EXTRACTION W/ INTRAOCULAR LENS IMPLANT Left 5/20//2013  . CHOLECYSTECTOMY, LAPAROSCOPIC  2003  . colonic polyp removal  1965  . COLONOSCOPY  08/29/2006   hemorrhoids, 3 small polyps (hyperplastic) & diverticulosis  . HEMORRHOID SURGERY  1953   HPI:  Ritter Sandefer a 82 y.o.malewith Parkinson's disease,  hypertension who has been experiencing episodes of staring spells and loss of consciousness. Found to have orthostatic hypotension. Pts lungs are clear and has not reported any difficutly swallowing, but SLP outside of room observed struggle and MD agreeable to eval. Pt reports new dx of Parkinsons, but has not started any SLP treatment yet.    Assessment / Plan / Recommendation Clinical Impression  Pt demosntrates signs of a mild Parkinson's related dysphagia with intermittent throat clearing and wet vocal quality indicative of pharyngeal residuals. Pt is not aware of any difficulty, but did notice wet voice after SLP pointed it out. He was able to complete an intermittent throat clear and second swallow, but is unlikely to carry over without assist. He also has low volume and rabid articulation characterisitc of Parkinson's. Pt will condsider appropriate f/u with a home health SLP, which could help him and his family with education, maintence of function and a home exercise program for speech and swallowing. No diet modification needed at this time given no current deliterious effects of mild dysphagia, but strongly recommend f/u with home health SLP. Will f/u acutely to reinforce precautions. Discussed with MD.     Aspiration Risk  Moderate aspiration risk    Diet Recommendation     Medication Administration: Whole meds with liquid Supervision: Patient able to self feed Compensations: Clear throat intermittently;Multiple dry swallows after each bite/sip Postural Changes: Seated upright at 90 degrees    Other  Recommendations Oral Care Recommendations: Oral care BID   Follow up Recommendations  Home health SLP    Frequency and Duration min 2x/week  2 weeks  Prognosis        Swallow Study   General HPI: Roxana Peavey a 82 y.o.malewith Parkinson's disease, hypertension who has been experiencing episodes of staring spells and loss of consciousness. Found to have orthostatic  hypotension. Pts lungs are clear and has not reported any difficutly swallowing, but SLP outside of room observed struggle and MD agreeable to eval. Pt reports new dx of Parkinsons, but has not started any SLP treatment yet.  Type of Study: Bedside Swallow Evaluation Previous Swallow Assessment: none Diet Prior to this Study: Regular;Thin liquids Temperature Spikes Noted: No Respiratory Status: Room air History of Recent Intubation: No Behavior/Cognition: Alert;Cooperative;Pleasant mood Oral Cavity Assessment: Within Functional Limits Oral Care Completed by SLP: No Vision: Functional for self-feeding Self-Feeding Abilities: Able to feed self Patient Positioning: Upright in chair Baseline Vocal Quality: Low vocal intensity Volitional Cough: Strong Volitional Swallow: Able to elicit    Oral/Motor/Sensory Function Overall Oral Motor/Sensory Function: Generalized oral weakness   Ice Chips     Thin Liquid Thin Liquid: Impaired Presentation: Cup;Self Fed Pharyngeal  Phase Impairments: Wet Vocal Quality;Throat Clearing - Delayed    Nectar Thick Nectar Thick Liquid: Not tested   Honey Thick Honey Thick Liquid: Not tested   Puree Puree: Within functional limits Presentation: Self Fed   Solid   GO   Solid: Within functional limits Presentation: Self Essie Hart, MA CCC-SLP [phone]  Lizzett Nobile, Katherene Ponto 04/27/2016,10:58 AM

## 2016-04-27 NOTE — Progress Notes (Signed)
PROGRESS NOTE    Lawrence Turner  [id number] DOB: 19-May-1926 DOA: 04/18/2016 PCP: Jeanmarie Hubert, MD   Brief Narrative:  Lawrence Turner is a 82 y.o. male with Parkinson's disease, hypertension who has been experiencing episodes of staring spells and loss of consciousness. He states this has been going on for 2 weeks. He states that he does not feel the episodes before they happen. He is not sure if he loses consciousness during the episodes but is told by witnesses that he has staring spells, confusion, and sometimes loss of consciousness. He denies as vision loss, focal weakness, loss of control of bladder or bowel, tongue biting. He does not believe he has hit his head. He followed up with his neurologist Dr. Jannifer Franklin in office on 8/30 for evaluation for these episodes. During his appointment, he had another episode where he was observed to be dyspneic, decreased responsiveness, and his orthostatic BP showed a 50 point drop from sitting to standing. Dr. Jannifer Franklin ordered 2D echo as well as CXR. However, patient had another episode at home which prompted his son to bring patient into the ED. ED physician discussed with on-call neurologist Dr. Leonel Ramsay who recommended EEG as well.   During hospitalization, he has been diagnosed with adrenal insufficency. Case was discussed over the phone with endocrinology and patient was started on cortef 20mg  in morning, 10mg  in evening. Patient continued to have significant orthostatic hypotension. His home medications remeron and sinemet which can have side effect of orthostatic hypotension were stopped. With stopped sinemet, his tremor significantly worsened, so this was added back at a lower dose. ACTH resulted low, consistent with secondary adrenal insufficiency. Patient was started on Florinef 0.1mg  as his orthostatic hypotension did not improve.   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-given wide fluctuations in BP sometimes positional and sometimes not -Tele showing NSR with PACs, EKG NSR rate 90 -EEG - normal, Echo - normal  -started on Hydrocortisone per Endocrine recs on 9/1 -I called and d/w Dr.Willis, his Neurologist, and we cut down his sinemet to 0.5 tab TID -TED hose  -started on 0.1mg  Florinef 9/5, this was cut down in half due to worsening supine hypertension -continues to have Wide fluctuations in BP, [id number] to 80 systolic this am (supine and sitting): suspect Autonomic neuropathy playing a significant role, I called and d/w Neuro Dr.Willis and Dr.Shikman   Secondary adrenal insufficiency -Random cortisol was 1.2 -ACTH stim test completed, ACTH level low at 1.3 -Dr.CHoi discussed with Endocrine Dr.Kumar x2, started on hydrocortisone 20mg  in morning, 10mg  in evening -she d/w Dr. Dwyane Dee again 9/5 as patient's orthostatic hypotension has not improved, he recommended Florinef 0.1mg  daily - monitor BP as patient has supine hypertension as well   -prolactin, TSH, T4, IGF, Lh, these are WNL, testosterone level is low -needs close FU with Dr.Kumar  Parkinson's disease -Follows with Dr. Jannifer Franklin, neurology -Dr.CHoi d/w Dr. Leta Baptist ([facility]) 9/5 -d/w Dr.Willis 9/7, Continue lower dose Sinement 0.5 tab TID  AKI, possibly dehydration  - resolved. Now at baseline   Chronic normocytic anemia -stable  Left foot ulcer, present on admission  -topical wound care  Pressure ulcer stage 2, posterior coccyx, present on admission per family  -wound care  Constipation  Bowel regimen   DVT prophylaxis: Lovenox Code Status: DNR Family Communication: called and d/w [name] 9/7 and 9/6 Disposition Plan: Complicated management as patient has significant supine hypertension with symptomatic  orthostatic hypotension with underlying adrenal insufficency and Parkinsonism, SNF when stable   Consultants:   D/w NEurology  Procedures:     None   Antimicrobials:   None    Subjective: Ok some unsteadiness, not very dizzy this am, walked in halls yesterday  Objective: Vitals:   04/27/16 0420 04/27/16 0602 04/27/16 1023 04/27/16 1436  BP: (!) 185/91 (!) 167/99 (!) 142/64 (!) 99/52  Pulse: 88 73 90 85  Resp: 18 18 18 18   Temp: 97.5 F (36.4 C) 97.5 F (36.4 C) 98.4 F (36.9 C) 98 F (36.7 C)  TempSrc: Oral Oral Oral Oral  SpO2: 100% 100%  98%  Weight:      Height:        Intake/Output Summary (Last 24 hours) at 04/27/16 1533 Last data filed at 04/27/16 0500  Gross per 24 hour  Intake                0 ml  Output              720 ml  Net             -720 ml   Filed Weights   04/22/16 0500 04/23/16 0502 04/25/16 0544  Weight: 60.7 kg (133 lb 14.4 oz) 60.6 kg (133 lb 9.6 oz) 62.1 kg (136 lb 14.4 oz)    Examination:  General exam: Appears calm and comfortable, flat affect Respiratory system: Clear to auscultation. Respiratory effort normal. Cardiovascular system: S1 & S2 heard, RRR. No JVD, murmurs, rubs, gallops or clicks. No pedal edema. Gastrointestinal system: Abdomen is nondistended, soft and nontender. No organomegaly or masses felt. Normal bowel sounds heard. Central nervous system: Alert and oriented. CN 2-12 grossly in tact, finger-nose symmetric and appropriate, answers all questions appropriately, moves all extremities spontaneously. Resting and intention tremor bilateral upper extremities  Extremities: Symmetric 5 x 5 power.  Skin: No rashes. Small clean ulcer on plantar surface left 5th metatarsal head - present on admission  Psychiatry: Judgement and insight appear normal. Mood & affect appropriate.   Data Reviewed: I have personally reviewed following labs and imaging studies  CBC:  Recent Labs Lab 04/21/16 0411 04/22/16 0523 04/23/16 0357 04/24/16 0654 04/26/16 0400  WBC 9.1 9.4 9.3 10.0 9.1  NEUTROABS 7.3 7.5 7.2 8.0*  --   HGB 10.8* 10.2* 10.4* 10.7* 9.8*  HCT 32.8* 31.5*  31.0* 32.6* 30.2*  MCV 94.8 95.2 94.2 94.8 95.3  PLT 163 161 164 149* [id number]   Basic Metabolic Panel:  Recent Labs Lab 04/21/16 0411 04/22/16 0523 04/23/16 0357 04/24/16 0654 04/26/16 0400  NA 138 135 136 137 137  K 4.0 4.0 3.7 3.9 3.7  CL 112* 109 109 110 110  CO2 18* 21* 22 21* 21*  GLUCOSE 104* 103* 117* 90 105*  BUN 24* 27* 32* 29* 27*  CREATININE 0.96 1.06 1.19 0.96 0.96  CALCIUM 8.9 8.6* 8.5* 8.8* 8.6*   GFR: Estimated Creatinine Clearance: 39.7 mL/min (by C-G formula based on SCr of 0.96 mg/dL). Liver Function Tests: No results for input(s): AST, ALT, ALKPHOS, BILITOT, PROT, ALBUMIN in the last 168 hours. No results for input(s): LIPASE, AMYLASE in the last 168 hours. No results for input(s): AMMONIA in the last 168 hours. Coagulation Profile: No results for input(s): INR, PROTIME in the last 168 hours. Cardiac Enzymes: No results for input(s): CKTOTAL, CKMB, CKMBINDEX, TROPONINI in the last 168 hours. BNP (last 3 results) No results for input(s): PROBNP in the last 8760 hours. HbA1C: No  results for input(s): HGBA1C in the last 72 hours. CBG:  Recent Labs Lab 04/24/16 2020 04/25/16 0542 04/25/16 0751 04/26/16 0810 04/27/16 0551  GLUCAP 164* 155* 79 102* 102*   Lipid Profile: No results for input(s): CHOL, HDL, LDLCALC, TRIG, CHOLHDL, LDLDIRECT in the last 72 hours. Thyroid Function Tests: No results for input(s): TSH, T4TOTAL, FREET4, T3FREE, THYROIDAB in the last 72 hours. Anemia Panel: No results for input(s): VITAMINB12, FOLATE, FERRITIN, TIBC, IRON, RETICCTPCT in the last 72 hours. Sepsis Labs: No results for input(s): PROCALCITON, LATICACIDVEN in the last 168 hours.  Recent Results (from the past 240 hour(s))  MRSA PCR Screening     Status: None   Collection Time: 04/19/16  4:04 AM  Result Value Ref Range Status   MRSA by PCR NEGATIVE NEGATIVE Final    Comment:        The GeneXpert MRSA Assay (FDA approved for NASAL specimens only), is one  component of a comprehensive MRSA colonization surveillance program. It is not intended to diagnose MRSA infection nor to guide or monitor treatment for MRSA infections.          Radiology Studies: No results found.      Scheduled Meds: . carbidopa-levodopa  0.5 tablet Oral TID  . enoxaparin (LOVENOX) injection  40 mg Subcutaneous Q24H  . famotidine  20 mg Oral Daily  . feeding supplement (PRO-STAT SUGAR FREE 64)  30 mL Oral BID  . fludrocortisone  0.05 mg Oral Daily  . hydrocortisone  10 mg Oral q1800  . hydrocortisone  20 mg Oral q morning - 10a  . megestrol  800 mg Oral Daily  . multivitamin with minerals  1 tablet Oral Daily  . mupirocin ointment  1 application Nasal BID  . psyllium  1 packet Oral TID  . sodium chloride flush  3 mL Intravenous Q12H  . vitamin B-12  1,000 mcg Oral Daily   Continuous Infusions:     LOS: 8 days    Time spent: 40 minutes     Domenic Polite, MD [facility] Pager [phone]  If 7PM-7AM, please contact night-coverage [email or web address] Password [email or web address] 04/27/2016, 3:33 PM

## 2016-04-28 NOTE — Progress Notes (Signed)
PROGRESS NOTE    Lawrence Turner  [id number] DOB: [birth year] DOA: 04/18/2016 PCP: Jeanmarie Hubert, MD   Brief Narrative:  Lawrence Turner is a 82 y.o. male with Parkinson's disease, hypertension who has been experiencing episodes of staring spells and loss of consciousness. He states this has been going on for 2 weeks. He states that he does not feel the episodes before they happen. He is not sure if he loses consciousness during the episodes but is told by witnesses that he has staring spells, confusion, and sometimes loss of consciousness. He denies as vision loss, focal weakness, loss of control of bladder or bowel, tongue biting. He does not believe he has hit his head. He followed up with his neurologist Dr. Jannifer Franklin in office on 8/30 for evaluation for these episodes. During his appointment, he had another episode where he was observed to be dyspneic, decreased responsiveness, and his orthostatic BP showed a 50 point drop from sitting to standing. Dr. Jannifer Franklin ordered 2D echo as well as CXR. However, patient had another episode at home which prompted his son to bring patient into the ED. ED physician discussed with on-call neurologist Dr. Leonel Ramsay who recommended EEG as well.   During hospitalization, he has been diagnosed with adrenal insufficency. Case was discussed over the phone with endocrinology and patient was started on cortef 20mg  in morning, 10mg  in evening. Patient continued to have significant orthostatic hypotension. His home medications remeron and sinemet which can have side effect of orthostatic hypotension were stopped. With stopped sinemet, his tremor significantly worsened, so this was added back at a lower dose. ACTH resulted low, consistent with secondary adrenal insufficiency. Patient was started on Florinef 0.1mg  as his orthostatic hypotension did not improve.   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-given wide fluctuations in BP sometimes positional and sometimes not -Tele showing NSR with PACs, EKG NSR rate 90 -EEG - normal, Echo - normal  -started on Hydrocortisone per Endocrine recs on 9/1 -I called and d/w Dr.Willis, his Neurologist, and we cut down his sinemet to 0.5 tab TID -TED hose  -started on 0.1mg  Florinef 9/5, this was cut down in half due to worsening supine hypertension -continues to have Wide fluctuations in BP, [id number] to 80 systolic: suspect Autonomic neuropathy playing a significant role, I called and d/w Neuro Dr.Willis and Dr.Shikman -symptom wise doing better, ambulating with PT without much symptomatology   Secondary adrenal insufficiency -Random cortisol was 1.2 -ACTH stim test completed, ACTH level low at 1.3 -Dr.CHoi discussed with Endocrine Dr.Kumar x2, started on hydrocortisone 20mg  in morning, 10mg  in evening -she d/w Dr. Dwyane Dee again 9/5 as patient's orthostatic hypotension has not improved, he recommended Florinef 0.1mg  daily - monitor BP as patient has supine hypertension as well   -prolactin, TSH, T4, IGF, LH, these are WNL, testosterone level is low -needs close FU with Dr.Kumar  Parkinson's disease -Follows with Dr. Jannifer Franklin, neurology -Dr.CHoi d/w Dr. Leta Baptist ([facility]) 9/5 -d/w Dr.Willis 9/7, Continue lower dose Sinement 0.5 tab TID -9/8 cut down FLorinef due to worsening supine HTN  AKI, possibly dehydration  - resolved. Now at baseline   Chronic normocytic anemia -stable  Left foot ulcer, present on admission  -topical wound care  Pressure ulcer stage 2, posterior coccyx, present on admission per family  -wound care  Constipation  Bowel regimen   DVT prophylaxis: Lovenox Code Status: DNR Family Communication: called and d/w son [name] 9/7,  9/6 and 9/9 Disposition Plan: Complicated management as patient has significant supine hypertension with symptomatic orthostatic hypotension with underlying  adrenal insufficency and Parkinsonism, SNF on Monday   Consultants:   D/w NEurology  Procedures:   None   Antimicrobials:   None    Subjective: Feels ok, ambulated with Pt in halls today  Objective: Vitals:   04/27/16 1023 04/27/16 1436 04/27/16 2131 04/28/16 0557  BP: (!) 142/64 (!) 99/52 (!) 180/82 (!) 207/97  Pulse: 90 85 80 77  Resp: 18 18 19 18   Temp: 98.4 F (36.9 C) 98 F (36.7 C) 98.2 F (36.8 C) 97.4 F (36.3 C)  TempSrc: Oral Oral  Oral  SpO2:  98% 98% 99%  Weight:      Height:        Intake/Output Summary (Last 24 hours) at 04/28/16 1140 Last data filed at 04/28/16 0943  Gross per 24 hour  Intake              600 ml  Output             1000 ml  Net             -400 ml   Filed Weights   04/22/16 0500 04/23/16 0502 04/25/16 0544  Weight: 60.7 kg (133 lb 14.4 oz) 60.6 kg (133 lb 9.6 oz) 62.1 kg (136 lb 14.4 oz)    Examination:  General exam: Appears calm and comfortable, flat affect Respiratory system: Clear to auscultation. Respiratory effort normal. Cardiovascular system: S1 & S2 heard, RRR. No JVD, murmurs, rubs, gallops or clicks. No pedal edema. Gastrointestinal system: Abdomen is nondistended, soft and nontender. No organomegaly or masses felt. Normal bowel sounds heard. Central nervous system: Alert and oriented. CN 2-12 grossly in tact, finger-nose symmetric and appropriate, answers all questions appropriately, moves all extremities spontaneously. Resting and intention tremor bilateral upper extremities  Extremities: Symmetric 5 x 5 power.  Skin: No rashes. Small clean ulcer on plantar surface left 5th metatarsal head - present on admission  Psychiatry: Judgement and insight appear normal. Mood & affect appropriate.   Data Reviewed: I have personally reviewed following labs and imaging studies  CBC:  Recent Labs Lab 04/22/16 0523 04/23/16 0357 04/24/16 0654 04/26/16 0400  WBC 9.4 9.3 10.0 9.1  NEUTROABS 7.5 7.2 8.0*  --   HGB 10.2*  10.4* 10.7* 9.8*  HCT 31.5* 31.0* 32.6* 30.2*  MCV 95.2 94.2 94.8 95.3  PLT 161 164 149* [id number]   Basic Metabolic Panel:  Recent Labs Lab 04/22/16 0523 04/23/16 0357 04/24/16 0654 04/26/16 0400  NA 135 136 137 137  K 4.0 3.7 3.9 3.7  CL 109 109 110 110  CO2 21* 22 21* 21*  GLUCOSE 103* 117* 90 105*  BUN 27* 32* 29* 27*  CREATININE 1.06 1.19 0.96 0.96  CALCIUM 8.6* 8.5* 8.8* 8.6*   GFR: Estimated Creatinine Clearance: 39.7 mL/min (by C-G formula based on SCr of 0.96 mg/dL). Liver Function Tests: No results for input(s): AST, ALT, ALKPHOS, BILITOT, PROT, ALBUMIN in the last 168 hours. No results for input(s): LIPASE, AMYLASE in the last 168 hours. No results for input(s): AMMONIA in the last 168 hours. Coagulation Profile: No results for input(s): INR, PROTIME in the last 168 hours. Cardiac Enzymes: No results for input(s): CKTOTAL, CKMB, CKMBINDEX, TROPONINI in the last 168 hours. BNP (last 3 results) No results for input(s): PROBNP in the last 8760 hours. HbA1C: No results for input(s): HGBA1C in the last 72 hours. CBG:  Recent Labs Lab 04/24/16 2020 04/25/16 0542 04/25/16 0751 04/26/16 0810 04/27/16 0551  GLUCAP 164* 155* 79 102* 102*   Lipid Profile: No results for input(s): CHOL, HDL, LDLCALC, TRIG, CHOLHDL, LDLDIRECT in the last 72 hours. Thyroid Function Tests: No results for input(s): TSH, T4TOTAL, FREET4, T3FREE, THYROIDAB in the last 72 hours. Anemia Panel: No results for input(s): VITAMINB12, FOLATE, FERRITIN, TIBC, IRON, RETICCTPCT in the last 72 hours. Sepsis Labs: No results for input(s): PROCALCITON, LATICACIDVEN in the last 168 hours.  Recent Results (from the past 240 hour(s))  MRSA PCR Screening     Status: None   Collection Time: 04/19/16  4:04 AM  Result Value Ref Range Status   MRSA by PCR NEGATIVE NEGATIVE Final    Comment:        The GeneXpert MRSA Assay (FDA approved for NASAL specimens only), is one component of a comprehensive MRSA  colonization surveillance program. It is not intended to diagnose MRSA infection nor to guide or monitor treatment for MRSA infections.          Radiology Studies: No results found.      Scheduled Meds: . carbidopa-levodopa  0.5 tablet Oral TID  . enoxaparin (LOVENOX) injection  40 mg Subcutaneous Q24H  . famotidine  20 mg Oral Daily  . feeding supplement (PRO-STAT SUGAR FREE 64)  30 mL Oral BID  . fludrocortisone  0.05 mg Oral Daily  . hydrocortisone  10 mg Oral q1800  . hydrocortisone  20 mg Oral q morning - 10a  . megestrol  800 mg Oral Daily  . multivitamin with minerals  1 tablet Oral Daily  . mupirocin ointment  1 application Nasal BID  . psyllium  1 packet Oral TID  . sodium chloride flush  3 mL Intravenous Q12H  . vitamin B-12  1,000 mcg Oral Daily   Continuous Infusions:     LOS: 9 days    Time spent: 35 minutes     Domenic Polite, MD [facility] Pager [phone]  If 7PM-7AM, please contact night-coverage [email or web address] Password [email or web address] 04/28/2016, 11:40 AM

## 2016-04-29 MED ORDER — CYCLOBENZAPRINE HCL 10 MG PO TABS
10.0000 mg | ORAL_TABLET | Freq: Once | ORAL | Status: AC
  Administered 2016-04-30: 10 mg via ORAL
  Filled 2016-04-29 (×2): qty 1

## 2016-04-29 NOTE — Plan of Care (Signed)
Problem: Skin Integrity: Goal: Risk for impaired skin integrity will decrease Outcome: Progressing Pt being turned q2h  Problem: Activity: Goal: Risk for activity intolerance will decrease Outcome: Progressing Pt up in chair today

## 2016-04-29 NOTE — Progress Notes (Signed)
PROGRESS NOTE    Lawrence Turner  [id number] DOB: 06/17/1926 DOA: 04/18/2016 PCP: Jeanmarie Hubert, MD   Brief Narrative:  Lawrence Turner is a 82 y.o. male with Parkinson's disease, hypertension who has been experiencing episodes of staring spells and loss of consciousness. He states this has been going on for 2 weeks. He states that he does not feel the episodes before they happen. He is not sure if he loses consciousness during the episodes but is told by witnesses that he has staring spells, confusion, and sometimes loss of consciousness. He denies as vision loss, focal weakness, loss of control of bladder or bowel, tongue biting. He does not believe he has hit his head. He followed up with his neurologist Dr. Jannifer Franklin in office on 8/30 for evaluation for these episodes. During his appointment, he had another episode where he was observed to be dyspneic, decreased responsiveness, and his orthostatic BP showed a 50 point drop from sitting to standing. Dr. Jannifer Franklin ordered 2D echo as well as CXR. However, patient had another episode at home which prompted his son to bring patient into the ED. ED physician discussed with on-call neurologist Dr. Leonel Ramsay who recommended EEG as well.  During hospitalization, he has been diagnosed with adrenal insufficency. Case was discussed over the phone with endocrinology and patient was started on cortef 20mg  in morning, 10mg  in evening. Patient continued to have significant orthostatic hypotension. His home medications remeron and sinemet which can have side effect of orthostatic hypotension were stopped. With stopped sinemet, his tremor significantly worsened, so this was added back at a lower dose. ACTH resulted low, consistent with secondary adrenal insufficiency. Patient was started on Florinef 0.1mg  as his orthostatic hypotension did not improve.   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-given wide fluctuations in BP sometimes positional and sometimes not -Tele showing NSR with PACs, EKG NSR rate 90 -EEG - normal, Echo - normal  -started on Hydrocortisone per Endocrine recs on 9/1 -I called and d/w Dr.Willis, his Neurologist, and we cut down his sinemet to 0.5 tab TID -TED hose  -started on 0.1mg  Florinef 9/5, this was cut down in half due to worsening supine hypertension -continues to have Wide fluctuations in BP, [id number] to 80 systolic: suspect Autonomic neuropathy playing a significant role, I called and d/w Neuro Dr.Willis and Dr.Shikman -symptom wise doing better, ambulating with PT without much symptomatology -plan for discharge to SNF tomorrow if stable with symptoms   Secondary adrenal insufficiency -Random cortisol was 1.2 -ACTH stim test completed, ACTH level low at 1.3 -Dr.CHoi discussed with Endocrine Dr.Kumar x2, started on hydrocortisone 20mg  in morning, 10mg  in evening -she d/w Dr. Dwyane Dee again 9/5 as patient's orthostatic hypotension has not improved, he recommended Florinef 0.1mg  daily - monitor BP as patient has supine hypertension as well   -prolactin, TSH, T4, IGF, LH, these are WNL, testosterone level is low -needs close FU with Dr.Kumar  Parkinson's disease -Follows with Dr. Jannifer Franklin, neurology -Dr.CHoi d/w Dr. Leta Baptist ([facility]) 9/5 -d/w Dr.Willis 9/7, Continue lower dose Sinement 0.5 tab TID -9/8 cut down FLorinef due to worsening supine HTN -no significant worsening of symptoms with changes to Sinemet  AKI, possibly dehydration  - resolved. Now at baseline   Chronic normocytic anemia -stable  Left foot ulcer, present on admission  -topical wound care  Pressure ulcer stage 2, posterior coccyx, present on admission per family  -wound care  Constipation  Bowel regimen   DVT prophylaxis: Lovenox Code Status: DNR Family Communication: called and d/w son [name] 9/7, 9/6 and 9/9 Disposition Plan:  Complicated management as patient has significant supine hypertension with symptomatic orthostatic hypotension with underlying adrenal insufficency and Parkinsonism, SNF on Monday   Consultants:   D/w NEurology  Procedures:   None   Antimicrobials:   None    Subjective: Feels ok, ambulated with Pt in halls today  Objective: Vitals:   04/28/16 1233 04/28/16 1235 04/28/16 2158 04/29/16 0558  BP: 128/76 (!) 109/52 (!) 184/88 (!) 212/97  Pulse: 84 93 74 71  Resp: 18  18 18   Temp:   97.9 F (36.6 C) 97.8 F (36.6 C)  TempSrc:   Oral Oral  SpO2: 97% 99% 99% 100%  Weight:    61.5 kg (135 lb 9.6 oz)  Height:        Intake/Output Summary (Last 24 hours) at 04/29/16 1131 Last data filed at 04/29/16 0600  Gross per 24 hour  Intake              360 ml  Output             1200 ml  Net             -840 ml   Filed Weights   04/23/16 0502 04/25/16 0544 04/29/16 0558  Weight: 60.6 kg (133 lb 9.6 oz) 62.1 kg (136 lb 14.4 oz) 61.5 kg (135 lb 9.6 oz)    Examination:  General exam: Appears calm and comfortable, flat affect Respiratory system: Clear to auscultation. Respiratory effort normal. Cardiovascular system: S1 & S2 heard, RRR. No JVD, murmurs, rubs, gallops or clicks. No pedal edema. Gastrointestinal system: Abdomen is nondistended, soft and nontender. No organomegaly or masses felt. Normal bowel sounds heard. Central nervous system: Alert and oriented. CN 2-12 grossly in tact, finger-nose symmetric and appropriate, answers all questions appropriately, moves all extremities spontaneously. Resting and intention tremor bilateral upper extremities  Extremities: Symmetric 5 x 5 power.  Skin: No rashes. Small clean ulcer on plantar surface left 5th metatarsal head - present on admission  Psychiatry: Judgement and insight appear normal. Mood & affect appropriate.   Data Reviewed: I have personally reviewed following labs and imaging studies  CBC:  Recent Labs Lab  04/23/16 0357 04/24/16 0654 04/26/16 0400  WBC 9.3 10.0 9.1  NEUTROABS 7.2 8.0*  --   HGB 10.4* 10.7* 9.8*  HCT 31.0* 32.6* 30.2*  MCV 94.2 94.8 95.3  PLT 164 149* [id number]   Basic Metabolic Panel:  Recent Labs Lab 04/23/16 0357 04/24/16 0654 04/26/16 0400  NA 136 137 137  K 3.7 3.9 3.7  CL 109 110 110  CO2 22 21* 21*  GLUCOSE 117* 90 105*  BUN 32* 29* 27*  CREATININE 1.19 0.96 0.96  CALCIUM 8.5* 8.8* 8.6*   GFR: Estimated Creatinine Clearance: 39.7 mL/min (by C-G formula based on SCr of 0.96 mg/dL). Liver Function Tests: No results for input(s): AST, ALT, ALKPHOS, BILITOT, PROT, ALBUMIN in the last 168 hours. No results for input(s): LIPASE, AMYLASE in the last 168 hours. No results for input(s): AMMONIA in the last 168 hours. Coagulation Profile: No results for input(s): INR, PROTIME in the last 168 hours. Cardiac Enzymes: No results for input(s): CKTOTAL, CKMB, CKMBINDEX, TROPONINI in the last 168 hours. BNP (last 3 results) No results for input(s): PROBNP in the last 8760 hours. HbA1C: No results for input(s): HGBA1C in the last 72 hours. CBG:  Recent Labs  Lab 04/24/16 2020 04/25/16 0542 04/25/16 0751 04/26/16 0810 04/27/16 0551  GLUCAP 164* 155* 79 102* 102*   Lipid Profile: No results for input(s): CHOL, HDL, LDLCALC, TRIG, CHOLHDL, LDLDIRECT in the last 72 hours. Thyroid Function Tests: No results for input(s): TSH, T4TOTAL, FREET4, T3FREE, THYROIDAB in the last 72 hours. Anemia Panel: No results for input(s): VITAMINB12, FOLATE, FERRITIN, TIBC, IRON, RETICCTPCT in the last 72 hours. Sepsis Labs: No results for input(s): PROCALCITON, LATICACIDVEN in the last 168 hours.  No results found for this or any previous visit (from the past 240 hour(s)).       Radiology Studies: No results found.      Scheduled Meds: . carbidopa-levodopa  0.5 tablet Oral TID  . enoxaparin (LOVENOX) injection  40 mg Subcutaneous Q24H  . famotidine  20 mg Oral Daily   . feeding supplement (PRO-STAT SUGAR FREE 64)  30 mL Oral BID  . fludrocortisone  0.05 mg Oral Daily  . hydrocortisone  10 mg Oral q1800  . hydrocortisone  20 mg Oral q morning - 10a  . megestrol  800 mg Oral Daily  . multivitamin with minerals  1 tablet Oral Daily  . mupirocin ointment  1 application Nasal BID  . psyllium  1 packet Oral TID  . sodium chloride flush  3 mL Intravenous Q12H  . vitamin B-12  1,000 mcg Oral Daily   Continuous Infusions:     LOS: 10 days    Time spent: 35 minutes     Domenic Polite, MD [facility] Pager [phone]  If 7PM-7AM, please contact night-coverage [email or web address] Password [email or web address] 04/29/2016, 11:31 AM

## 2016-04-29 NOTE — Progress Notes (Signed)
Orthostatic VS positive. See Flowsheet. Dr Broadus John aware. No new orders given. Pt denies symptoms and is sitting in chair, eating lunch.

## 2016-04-30 DIAGNOSIS — M79652 Pain in left thigh: Secondary | ICD-10-CM | POA: Diagnosis not present

## 2016-04-30 DIAGNOSIS — M2042 Other hammer toe(s) (acquired), left foot: Secondary | ICD-10-CM | POA: Diagnosis not present

## 2016-04-30 DIAGNOSIS — L84 Corns and callosities: Secondary | ICD-10-CM | POA: Diagnosis not present

## 2016-04-30 DIAGNOSIS — K5900 Constipation, unspecified: Secondary | ICD-10-CM | POA: Diagnosis not present

## 2016-04-30 DIAGNOSIS — R609 Edema, unspecified: Secondary | ICD-10-CM | POA: Diagnosis not present

## 2016-04-30 DIAGNOSIS — L97501 Non-pressure chronic ulcer of other part of unspecified foot limited to breakdown of skin: Secondary | ICD-10-CM | POA: Diagnosis not present

## 2016-04-30 DIAGNOSIS — Z5181 Encounter for therapeutic drug level monitoring: Secondary | ICD-10-CM | POA: Diagnosis not present

## 2016-04-30 DIAGNOSIS — R2681 Unsteadiness on feet: Secondary | ICD-10-CM | POA: Diagnosis not present

## 2016-04-30 DIAGNOSIS — M1990 Unspecified osteoarthritis, unspecified site: Secondary | ICD-10-CM | POA: Diagnosis not present

## 2016-04-30 DIAGNOSIS — G20 Parkinson's disease: Secondary | ICD-10-CM | POA: Diagnosis not present

## 2016-04-30 DIAGNOSIS — G629 Polyneuropathy, unspecified: Secondary | ICD-10-CM | POA: Diagnosis not present

## 2016-04-30 DIAGNOSIS — D649 Anemia, unspecified: Secondary | ICD-10-CM | POA: Diagnosis not present

## 2016-04-30 DIAGNOSIS — E538 Deficiency of other specified B group vitamins: Secondary | ICD-10-CM | POA: Diagnosis not present

## 2016-04-30 DIAGNOSIS — H47013 Ischemic optic neuropathy, bilateral: Secondary | ICD-10-CM | POA: Diagnosis not present

## 2016-04-30 DIAGNOSIS — E2749 Other adrenocortical insufficiency: Secondary | ICD-10-CM | POA: Diagnosis not present

## 2016-04-30 DIAGNOSIS — R55 Syncope and collapse: Secondary | ICD-10-CM | POA: Diagnosis not present

## 2016-04-30 DIAGNOSIS — I959 Hypotension, unspecified: Secondary | ICD-10-CM | POA: Diagnosis not present

## 2016-04-30 DIAGNOSIS — R269 Unspecified abnormalities of gait and mobility: Secondary | ICD-10-CM | POA: Diagnosis not present

## 2016-04-30 DIAGNOSIS — L97509 Non-pressure chronic ulcer of other part of unspecified foot with unspecified severity: Secondary | ICD-10-CM | POA: Diagnosis not present

## 2016-04-30 DIAGNOSIS — R1312 Dysphagia, oropharyngeal phase: Secondary | ICD-10-CM | POA: Diagnosis not present

## 2016-04-30 DIAGNOSIS — K219 Gastro-esophageal reflux disease without esophagitis: Secondary | ICD-10-CM | POA: Diagnosis not present

## 2016-04-30 DIAGNOSIS — N400 Enlarged prostate without lower urinary tract symptoms: Secondary | ICD-10-CM | POA: Diagnosis not present

## 2016-04-30 DIAGNOSIS — I10 Essential (primary) hypertension: Secondary | ICD-10-CM | POA: Diagnosis not present

## 2016-04-30 DIAGNOSIS — D126 Benign neoplasm of colon, unspecified: Secondary | ICD-10-CM | POA: Diagnosis not present

## 2016-04-30 DIAGNOSIS — R634 Abnormal weight loss: Secondary | ICD-10-CM | POA: Diagnosis not present

## 2016-04-30 DIAGNOSIS — R262 Difficulty in walking, not elsewhere classified: Secondary | ICD-10-CM | POA: Diagnosis not present

## 2016-04-30 DIAGNOSIS — I951 Orthostatic hypotension: Secondary | ICD-10-CM | POA: Diagnosis not present

## 2016-04-30 DIAGNOSIS — E7800 Pure hypercholesterolemia, unspecified: Secondary | ICD-10-CM | POA: Diagnosis not present

## 2016-04-30 DIAGNOSIS — D519 Vitamin B12 deficiency anemia, unspecified: Secondary | ICD-10-CM | POA: Diagnosis not present

## 2016-04-30 DIAGNOSIS — M25552 Pain in left hip: Secondary | ICD-10-CM | POA: Diagnosis not present

## 2016-04-30 DIAGNOSIS — M6281 Muscle weakness (generalized): Secondary | ICD-10-CM | POA: Diagnosis not present

## 2016-04-30 DIAGNOSIS — R569 Unspecified convulsions: Secondary | ICD-10-CM | POA: Diagnosis not present

## 2016-04-30 DIAGNOSIS — M545 Low back pain: Secondary | ICD-10-CM | POA: Diagnosis not present

## 2016-04-30 DIAGNOSIS — R479 Unspecified speech disturbances: Secondary | ICD-10-CM | POA: Diagnosis not present

## 2016-04-30 DIAGNOSIS — Z7409 Other reduced mobility: Secondary | ICD-10-CM | POA: Diagnosis not present

## 2016-04-30 LAB — GLUCOSE, CAPILLARY: Glucose-Capillary: 113 mg/dL — ABNORMAL HIGH (ref 65–99)

## 2016-04-30 MED ORDER — HYDROCORTISONE 20 MG PO TABS: 20.0000 mg | ORAL_TABLET | Freq: Every morning | ORAL | 0 refills | Status: DC

## 2016-04-30 MED ORDER — ACETAMINOPHEN 325 MG PO TABS
650.0000 mg | ORAL_TABLET | Freq: Four times a day (QID) | ORAL | Status: DC | PRN
Start: 2016-04-30 — End: 2016-04-30

## 2016-04-30 MED ORDER — CARBIDOPA-LEVODOPA 25-100 MG PO TABS: 0.5000 | ORAL_TABLET | Freq: Three times a day (TID) | ORAL | Status: DC

## 2016-04-30 MED ORDER — HYDROCORTISONE 10 MG PO TABS: 10.0000 mg | ORAL_TABLET | Freq: Every day | ORAL | 0 refills | Status: DC

## 2016-04-30 MED ORDER — CYCLOBENZAPRINE HCL 5 MG PO TABS: 5.0000 mg | ORAL_TABLET | Freq: Three times a day (TID) | ORAL | 0 refills | Status: DC | PRN

## 2016-04-30 MED ORDER — FLUDROCORTISONE ACETATE 0.1 MG PO TABS: 0.0500 mg | ORAL_TABLET | Freq: Every day | ORAL | 0 refills | Status: DC

## 2016-04-30 MED ORDER — CYCLOBENZAPRINE HCL 5 MG PO TABS
7.5000 mg | ORAL_TABLET | Freq: Three times a day (TID) | ORAL | Status: DC | PRN
Start: 2016-04-30 — End: 2016-04-30
  Administered 2016-04-30: 7.5 mg via ORAL
  Filled 2016-04-30: qty 2

## 2016-04-30 MED ORDER — IBUPROFEN 400 MG PO TABS: 400.0000 mg | ORAL_TABLET | Freq: Four times a day (QID) | ORAL | 0 refills | Status: AC | PRN

## 2016-04-30 MED ORDER — IBUPROFEN 400 MG PO TABS
400.0000 mg | ORAL_TABLET | Freq: Four times a day (QID) | ORAL | Status: DC | PRN
  Administered 2016-04-30: 400 mg via ORAL
  Filled 2016-04-30: qty 1

## 2016-04-30 NOTE — Progress Notes (Signed)
Patient will discharge to [facility]  Anticipated discharge date: 9/11 Family notified: pt son Transportation by [employer]- scheduled for 4:45pm  CSW signing off.  Jorge Ny, [place] Social Worker [phone]

## 2016-04-30 NOTE — Discharge Summary (Signed)
Physician Discharge Summary  Lawrence Turner [id number] DOB: 1926/07/02 DOA: 04/18/2016  PCP: Jeanmarie Hubert, MD  Admit date: 04/18/2016 Discharge date: 04/30/2016  Time spent: 45 minutes  Recommendations for Outpatient Follow-up:  1. Do not add antihypertensives for elevated supine BP, unless standing and and sitting BPs are also high, very labile BPs due to autonomic neuropathy from Parkinson's disease 2. Wear ted hose during daytime 3. Keep Head of bed up 30degress when laying down 4. FU with Neurologist Dr.Willis in 1week 5. FU with Dr.Ajay Kumar Endocrinology in 1 month for secondary adrenal insufficiency    Discharge Diagnoses:    Autonomic neuropathy   Severe Orthostatic hypotension   Extremely labile BPs   Parkinsons disease   Secondary adrenal insufficiency (HCC)   Uncontrolled hypertension   Peripheral neuropathy (HCC)   Normocytic anemia   Parkinson's disease (HCC)   Constipation   Ulcer of foot (HCC)   Orthostatic hypotension   Syncope   Pressure ulcer stage II   Discharge Condition: improved  Diet recommendation: regular diet  Filed Weights   04/25/16 0544 04/29/16 0558 04/30/16 0536  Weight: 62.1 kg (136 lb 14.4 oz) 61.5 kg (135 lb 9.6 oz) 63 kg (138 lb 14.2 oz)    History of present illness:  Chief Complaint: Loss of consciousness. HPI: Lawrence Turner is a 82 y.o. male with Parkinson's disease, hypertension has been experiencing episodes of loss of consciousness, 3 episodes in 3 weeks. Patient had followed up with Dr. Jannifer Franklin neurologist yesterday for possible seizure and had a similar episode at the neurologist office where patient had a sudden loss of consciousness and was found to have blood pressure systolic in the [id number] sitting  Hospital Course:  Orthostatic hypotension  -due to adrenal insufficiency, Dopa agonist-sinemet and PRIMARILY Autonomic Neuropathy due to Parkinsons-given wide fluctuations in BP sometimes positional and sometimes not. -initially was  hydrated for several days -Tele showed NSR with PACs, EKG NSR rate 90 -EEG - normal, Echo - normal  -Also found to have low random cortisol and low ACTH and hence started on Hydrocortisone per Endocrine recommendations per Kateri Plummer on 9/1 -I called and d/w Dr.Willis, his Neurologist, and we cut down his sinemet to 0.5 tab TID -TED hose added -started on 0.1mg  Florinef on 9/5, due to persistent orthostatic hypotension and this was cut down in half due to worsening supine hypertension -continues to have Wide fluctuations in BP, [id number] to 80 systolic: suspect Autonomic neuropathy playing a significant role. We have been tolerating high supine BPs due to persistent orthostatic drop in BPs -symptom wise doing better, ambulating with PT without much symptomatology -plan for discharge to SNF today with close Follow up with Dr.Willis   Secondary adrenal insufficiency -Random cortisol was noted to be 1.2 -ACTH stim test completed, ACTH level low at 1.3 -Dr.CHoi discussed with Endocrine Dr.Kumar x2, started on hydrocortisone 20mg  in morning, 10mg  in evening -she d/w Dr. Dwyane Dee again 9/5 as patient's orthostatic hypotension has not improved, he recommended Florinef 0.1mg  daily, this was cut down due to high supine BPs   -prolactin, TSH, T4, IGF, LH, these are WNL, testosterone level is low -needs close FU with Dr.Kumar  Parkinson's disease -Followed by  Dr. Jannifer Franklin, neurology -Dr.CHoi d/w Dr. Leta Baptist ([facility]) 9/5 -I called and d/w Dr.Willis 9/7, we cut down Sinement to 0.5 tab TID -no significant worsening of parkinsons symptoms with changes to Sinemet  AKI, possibly dehydration  - resolved. Now at baseline   Chronic normocytic anemia -stable  Left  foot ulcer, present on admission  -topical wound care  Pressure ulcer stage 2, posterior coccyx, present on admission per family  -wound care  Constipation                       Bowel regimen    Consultations:  Neurology  Discharge Exam: Vitals:   04/30/16 0900 04/30/16 1500  BP: (!) 145/76 (!) 186/88  Pulse: (!) 105 72  Resp: 20 16  Temp: 97.5 F (36.4 C) 98.4 F (36.9 C)    General: AAOx3 Cardiovascular: S1S2/RRR Respiratory: CTAB  Discharge Instructions   Discharge Instructions    Diet general    Complete by:  As directed   Increase activity slowly    Complete by:  As directed     Current Discharge Medication List    START taking these medications   Details  cyclobenzaprine (FLEXERIL) 5 MG tablet Take 1 tablet (5 mg total) by mouth 3 (three) times daily as needed for muscle spasms (muscle pain and stiffness). Qty: 15 tablet, Refills: 0    fludrocortisone (FLORINEF) 0.1 MG tablet Take 0.5 tablets (0.05 mg total) by mouth daily. Qty: 30 tablet, Refills: 0    !! hydrocortisone (CORTEF) 10 MG tablet Take 1 tablet (10 mg total) by mouth daily at 6 PM. Every evening Qty: 30 tablet, Refills: 0    !! hydrocortisone (CORTEF) 20 MG tablet Take 1 tablet (20 mg total) by mouth every morning. Every morning Qty: 30 tablet, Refills: 0    ibuprofen (ADVIL,MOTRIN) 400 MG tablet Take 1 tablet (400 mg total) by mouth every 6 (six) hours as needed for moderate pain. Refills: 0     !! - Potential duplicate medications found. Please discuss with provider.    CONTINUE these medications which have CHANGED   Details  carbidopa-levodopa (SINEMET IR) 25-100 MG tablet Take 0.5 tablets by mouth 3 (three) times daily.      CONTINUE these medications which have NOT CHANGED   Details  acetaminophen (TYLENOL) 325 MG tablet Take 650 mg by mouth every 4 (four) hours as needed for mild pain.     Amino Acids-Protein Hydrolys (FEEDING SUPPLEMENT, PRO-STAT SUGAR FREE 64,) LIQD Take 30 mLs by mouth 2 (two) times daily. 30 ml twice daily to promote wound healing     lactulose (CHRONULAC) 10 GM/15ML solution Take 40 g by mouth daily as needed for mild constipation. Take 60 ml as  needed for constipation     megestrol (MEGACE) 40 MG/ML suspension Take 35ml daily to help stimulate appetite    mirtazapine (REMERON) 15 MG tablet Take 30 mg by mouth at bedtime.     Multiple Vitamin (MULTIVITAMIN) tablet Take 1 tablet by mouth daily.    Nutritional Supplements (BOOST PLUS PO) Take 1 Can by mouth 3 (three) times daily. One can three times a day with meals     polyethylene glycol (MIRALAX / GLYCOLAX) packet Take 17 g by mouth daily as needed for mild constipation.    psyllium (METAMUCIL) 58.6 % powder Take 1 packet by mouth daily. One tablespoon in 4 oz liquid daily     ranitidine (ZANTAC) 150 MG tablet Take 150 mg by mouth at bedtime.     vitamin B-12 (CYANOCOBALAMIN) 1000 MCG tablet Take 1,000 mcg by mouth daily.    zinc oxide 20 % ointment Apply 1 application topically 2 (two) times daily as needed for irritation.    mupirocin ointment (BACTROBAN) 2 % Place 1 application into the nose.  Apply to left 5th toe ulcer after soaking       Allergies  Allergen Reactions  . Lyrica [Pregabalin]     *Anticonvulsants*, fatique   Follow-up Information    Jeanmarie Hubert, MD. Schedule an appointment as soon as possible for a visit in 1 week(s).   Specialty:  Internal Medicine Contact information: [place] [address] [phone]        Lenor Coffin, MD. Schedule an appointment as soon as possible for a visit in 1 week(s).   Specialty:  Neurology Contact information: [address] [place] [address] [phone]            The results of significant diagnostics from this hospitalization (including imaging, microbiology, ancillary and laboratory) are listed below for reference.    Significant Diagnostic Studies: Dg Chest 2 View  Result Date: 04/18/2016 CLINICAL DATA:  Syncope.  Weakness and dyspnea tonight. EXAM: CHEST  2 VIEW COMPARISON:  None. FINDINGS: The lungs are clear. The pulmonary vasculature is normal.  Heart size is normal. Hilar and mediastinal contours are unremarkable. There is no pleural effusion. IMPRESSION: No active cardiopulmonary disease. Electronically Signed   By: Andreas Newport M.D.   On: 04/18/2016 22:50    Microbiology: No results found for this or any previous visit (from the past 240 hour(s)).   Labs: Basic Metabolic Panel:  Recent Labs Lab 04/24/16 0654 04/26/16 0400  NA 137 137  K 3.9 3.7  CL 110 110  CO2 21* 21*  GLUCOSE 90 105*  BUN 29* 27*  CREATININE 0.96 0.96  CALCIUM 8.8* 8.6*   Liver Function Tests: No results for input(s): AST, ALT, ALKPHOS, BILITOT, PROT, ALBUMIN in the last 168 hours. No results for input(s): LIPASE, AMYLASE in the last 168 hours. No results for input(s): AMMONIA in the last 168 hours. CBC:  Recent Labs Lab 04/24/16 0654 04/26/16 0400  WBC 10.0 9.1  NEUTROABS 8.0*  --   HGB 10.7* 9.8*  HCT 32.6* 30.2*  MCV 94.8 95.3  PLT 149* 156   Cardiac Enzymes: No results for input(s): CKTOTAL, CKMB, CKMBINDEX, TROPONINI in the last 168 hours. BNP: BNP (last 3 results) No results for input(s): BNP in the last 8760 hours.  ProBNP (last 3 results) No results for input(s): PROBNP in the last 8760 hours.  CBG:  Recent Labs Lab 04/25/16 0542 04/25/16 0751 04/26/16 0810 04/27/16 0551 04/30/16 0535  GLUCAP 155* 79 102* 102* 113*       SignedDomenic Polite MD.  [facility] 04/30/2016, 3:41 PM

## 2016-04-30 NOTE — Care Management Important Message (Signed)
Important Message  Patient Details  Name: Lawrence Turner MRN: [id number] Date of Birth: 07/31/1926   Medicare Important Message Given:  Yes    Orbie Pyo 04/30/2016, 2:28 PM

## 2016-04-30 NOTE — Progress Notes (Signed)
Lawrence Turner discharged Skilled nursing facility per MD order.  Report called to receiving Ysuing, RN at [facility].     Medication List    TAKE these medications   acetaminophen 325 MG tablet Commonly known as:  TYLENOL Take 650 mg by mouth every 4 (four) hours as needed for mild pain.   BOOST PLUS PO Take 1 Can by mouth 3 (three) times daily. One can three times a day with meals   carbidopa-levodopa 25-100 MG tablet Commonly known as:  SINEMET IR Take 0.5 tablets by mouth 3 (three) times daily. What changed:  how much to take   cyclobenzaprine 5 MG tablet Commonly known as:  FLEXERIL Take 1 tablet (5 mg total) by mouth 3 (three) times daily as needed for muscle spasms (muscle pain and stiffness).   feeding supplement (PRO-STAT SUGAR FREE 64) Liqd Take 30 mLs by mouth 2 (two) times daily. 30 ml twice daily to promote wound healing   fludrocortisone 0.1 MG tablet Commonly known as:  FLORINEF Take 0.5 tablets (0.05 mg total) by mouth daily.   hydrocortisone 10 MG tablet Commonly known as:  CORTEF Take 1 tablet (10 mg total) by mouth daily at 6 PM. Every evening   hydrocortisone 20 MG tablet Commonly known as:  CORTEF Take 1 tablet (20 mg total) by mouth every morning. Every morning   ibuprofen 400 MG tablet Commonly known as:  ADVIL,MOTRIN Take 1 tablet (400 mg total) by mouth every 6 (six) hours as needed for moderate pain.   lactulose 10 GM/15ML solution Commonly known as:  CHRONULAC Take 40 g by mouth daily as needed for mild constipation. Take 60 ml as needed for constipation   megestrol 40 MG/ML suspension Commonly known as:  MEGACE Take 32ml daily to help stimulate appetite   mirtazapine 15 MG tablet Commonly known as:  REMERON Take 30 mg by mouth at bedtime.   multivitamin tablet Take 1 tablet by mouth daily.   mupirocin ointment 2 % Commonly known as:  BACTROBAN Place 1 application into the nose. Apply to left 5th toe ulcer after soaking    polyethylene glycol packet Commonly known as:  MIRALAX / GLYCOLAX Take 17 g by mouth daily as needed for mild constipation.   psyllium 58.6 % powder Commonly known as:  METAMUCIL Take 1 packet by mouth daily. One tablespoon in 4 oz liquid daily   ranitidine 150 MG tablet Commonly known as:  ZANTAC Take 150 mg by mouth at bedtime.   vitamin B-12 1000 MCG tablet Commonly known as:  CYANOCOBALAMIN Take 1,000 mcg by mouth daily.   zinc oxide 20 % ointment Apply 1 application topically 2 (two) times daily as needed for irritation.       Patients skin is clean, dry with stage 2 to sacrum. IV site discontinued and catheter remains intact. Site without signs and symptoms of complications. Dressing and pressure applied.  Patient transported on a stretcher by non emergent EMS,  no distress noted upon discharge.  Lawrence Turner, Lawrence Turner 04/30/2016 5:53 PM

## 2016-04-30 NOTE — Progress Notes (Signed)
Speech Language Pathology Treatment: Dysphagia  Patient Details Name: Lawrence Turner MRN: [id number] DOB: Sep 20, 1925 Today's Date: 04/30/2016 Time: [id number] SLP Time Calculation (min) (ACUTE ONLY): 18 min  Assessment / Plan / Recommendation Clinical Impression  Followed up with pt to reinforce compensatory strategies with swallow during am meal. With moderate fading to min verbal cueing pt was able to clear throat and swallow intermittently during intake of solids and liquids with improved vocal quality during meal, suggestive of preventative expectoration of residuals or penetrates. Also discussed probable therapeutic interventions for low volume and pts perception of slurred speech at next level of care. Pt unable to attempt today due to pain following meal. Recommend f/u at next level of care, which now seems to be SNF.    HPI HPI: Lawrence Turner a 82 y.o.malewith Parkinson's disease, hypertension who has been experiencing episodes of staring spells and loss of consciousness. Found to have orthostatic hypotension. Pts lungs are clear and has not reported any difficutly swallowing, but SLP outside of room observed struggle and MD agreeable to eval. Pt reports new dx of Parkinsons, but has not started any SLP treatment yet.       SLP Plan  Continue with current plan of care     Recommendations  Diet recommendations: Regular;Thin liquid Liquids provided via: Cup;Straw Medication Administration: Whole meds with liquid Supervision: Patient able to self feed Compensations: Clear throat intermittently;Multiple dry swallows after each bite/sip Postural Changes and/or Swallow Maneuvers: Seated upright 90 degrees             Oral Care Recommendations: Oral care BID Follow up Recommendations: Skilled Nursing facility Plan: Continue with current plan of care     Lawrence Miraj Truss, MA CCC-SLP [id number]  Lawrence Turner 04/30/2016, 9:59 AM

## 2016-04-30 NOTE — Progress Notes (Signed)
Physical Therapy Treatment Patient Details Name: Lawrence Turner MRN: [id number] DOB: [birth year] Today's Date: 04/30/2016    History of Present Illness  Lawrence Turner is a 82 y.o. male with Parkinson's disease, hypertension has been experiencing episodes of staring spells and loss of consciousness  Work up includes orthostatics and tachycardia.    PT Comments    Patient continues to be high fall risk and demo shuffling/festinating gait this session. Tolerated gait however very fatigued end of session. BP with ambulation 141/81 and SpO2 98% with no c/o dizziness during session. Current plan remains appropriate.   Follow Up Recommendations  SNF     Equipment Recommendations  None recommended by PT    Recommendations for Other Services       Precautions / Restrictions Precautions Precautions: Fall Restrictions Weight Bearing Restrictions: No    Mobility  Bed Mobility Overal bed mobility: Needs Assistance Bed Mobility: Supine to Sit;Sit to Supine     Supine to sit: Min assist Sit to supine: Mod assist   General bed mobility comments: cues for sequencing/technique; use of rail; increased time with HOB elevated; assist required to elevate trunk into sitting; assist to elevate bilat LE into bed  Transfers Overall transfer level: Needs assistance Equipment used: Rolling walker (2 wheeled) Transfers: Sit to/from Stand Sit to Stand: Min assist         General transfer comment: assist to power up into standing; safe hand placement  Ambulation/Gait Ambulation/Gait assistance: Min assist Ambulation Distance (Feet): 200 Feet Assistive device: Rolling walker (2 wheeled) Gait Pattern/deviations: Step-through pattern;Shuffle;Festinating     General Gait Details: able to demo step through gait at times with short bilat step lengths; grossly shuffling gait with some festinating   Stairs            Wheelchair Mobility    Modified Rankin (Stroke Patients Only)        Balance     Sitting balance-Leahy Scale: Good       Standing balance-Leahy Scale: Poor                      Cognition Arousal/Alertness: Awake/alert Behavior During Therapy: Flat affect Overall Cognitive Status: Within Functional Limits for tasks assessed                      Exercises      General Comments        Pertinent Vitals/Pain Pain Assessment: Faces Faces Pain Scale: Hurts little more Pain Location: bilat anterior thighs Pain Descriptors / Indicators: Sore Pain Intervention(s): Limited activity within patient's tolerance;Monitored during session;Repositioned    Home Living                      Prior Function            PT Goals (current goals can now be found in the care plan section) Acute Rehab PT Goals Patient Stated Goal: go to rehab before going home Progress towards PT goals: Progressing toward goals    Frequency  Min 3X/week    PT Plan Current plan remains appropriate    Co-evaluation             End of Session Equipment Utilized During Treatment: Gait belt Activity Tolerance: Patient limited by fatigue Patient left: in bed;with call bell/phone within reach     Time: 1548-1616 PT Time Calculation (min) (ACUTE ONLY): 28 min  Charges:  $Gait Training: 8-22 mins $Therapeutic Activity: 8-22 mins  G Codes:      Salina April, PTA Pager: [phone]    04/30/2016, 4:41 PM

## 2016-04-30 NOTE — Care Management Important Message (Signed)
Important Message  Patient Details  Name: Lawrence Turner MRN: [id number] Date of Birth: August 14, 1926   Medicare Important Message Given:  Yes    Orbie Pyo 04/30/2016, 2:27 PM

## 2016-05-01 ENCOUNTER — Non-Acute Institutional Stay (SKILLED_NURSING_FACILITY): Payer: Medicare Other | Admitting: Internal Medicine

## 2016-05-01 ENCOUNTER — Encounter: Payer: Self-pay | Admitting: Internal Medicine

## 2016-05-01 DIAGNOSIS — R269 Unspecified abnormalities of gait and mobility: Secondary | ICD-10-CM | POA: Diagnosis not present

## 2016-05-01 DIAGNOSIS — I10 Essential (primary) hypertension: Secondary | ICD-10-CM

## 2016-05-01 DIAGNOSIS — L97501 Non-pressure chronic ulcer of other part of unspecified foot limited to breakdown of skin: Secondary | ICD-10-CM | POA: Diagnosis not present

## 2016-05-01 DIAGNOSIS — E2749 Other adrenocortical insufficiency: Secondary | ICD-10-CM | POA: Diagnosis not present

## 2016-05-01 DIAGNOSIS — G20 Parkinson's disease: Secondary | ICD-10-CM

## 2016-05-01 DIAGNOSIS — R609 Edema, unspecified: Secondary | ICD-10-CM | POA: Diagnosis not present

## 2016-05-01 DIAGNOSIS — I951 Orthostatic hypotension: Secondary | ICD-10-CM

## 2016-05-01 NOTE — Progress Notes (Signed)
History and Physical    Location:   [facility] Room Number: 25 Place of Service:  SNF [phone]) Provider:  Jeanmarie Hubert, MD  Patient Care Team: Estill Dooms, MD as PCP - General (Internal Medicine) Man Otho Darner, NP as Nurse Practitioner (Nurse Practitioner) Kathrynn Ducking, MD as Consulting Physician (Neurology)  Extended Emergency Contact Information Primary Emergency Contact: Lucita Ferrara Address: [address]           [place], [place] [address] [name] of [place] Phone: [phone] Relation: Spouse Secondary Emergency Contact: Barrientez,Clifford  [place] Phone: [phone] Relation: Son  Code Status:  DNR Goals of care: Advanced Directive information Advanced Directives 05/01/2016  Does patient have an advance directive? Yes  Type of Advance Directive Living will;Out of facility DNR (pink MOST or yellow form)  Does patient want to make changes to advanced directive? No - Patient declined  Copy of advanced directive(s) in chart? Yes     Chief Complaint  Patient presents with  . Readmit To SNF    HPI:  Pt is a 82 y.o. male seen for readmission to [facility]  Following hospitalization from   04/18/16 to 04/30/16. Patient presented with multiple episodes of loss of consciousness at his nursing home. Immediately prior to hospitalization, he had an episode of LOC in office of Dr. Eugenie Birks. SBP was 70 at that time.  During the hospital stay, he was found to have a significant postural drop in BP, but the SBP was high when recumbent.  He had a random cortisol that was low and a low ACTH.  Sinemet, used for his Parkinsonism was reduced and he was started on Florinef. TED hos were recommended.  Since return, he still feels weak, but there have not been any further syncopal episodes.    EEG and Echo were normal.   Past Medical History:  Diagnosis Date  . Abnormality of gait 10/19/2015  . Anemia, unspecified   . Benign  neoplasm of colon    pre-cancerous polyps removed at colonoscopy 2008  . Contact dermatitis and other eczema, due to unspecified cause   . Diverticulosis of colon (without mention of hemorrhage)   . Esophageal reflux   . Hypertrophy of prostate without urinary obstruction and other lower urinary tract symptoms (LUTS)   . Mononeuritis of unspecified site   . Orthostatic hypotension 04/18/2016  . Osteoarthrosis, unspecified whether generalized or localized, pelvic region and thigh   . Other B-complex deficiencies   . Other pulmonary embolism and infarction    following cholecystectomy 2003  . Parkinson disease ([place]) 10/19/2015  . Peripheral neuropathy ([place])   . Pure hypercholesterolemia   . Rosacea   . Seizures ([place]) 04/17/2016  . 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 Syncope and collapse 04/18/2016  . Ulcer of foot ([place]) 04/03/2016  . Unspecified constipation   . Unspecified essential hypertension   . Unspecified gastritis and gastroduodenitis without mention of hemorrhage 08/29/2006  . Unspecified hemorrhoids without mention of complication   . Unspecified vitamin D deficiency    Past Surgical History:  Procedure Laterality Date  . CATARACT EXTRACTION W/ INTRAOCULAR LENS IMPLANT Left 5/20//2013  . CHOLECYSTECTOMY, LAPAROSCOPIC  2003  . colonic polyp removal  1965  . COLONOSCOPY  08/29/2006   hemorrhoids, 3 small polyps (hyperplastic) & diverticulosis  . HEMORRHOID SURGERY  1953    Allergies  Allergen Reactions  . Lyrica [Pregabalin]     *  Anticonvulsants*, fatique      Medication List       Accurate as of 05/01/16  3:59 PM. Always use your most recent med list.          acetaminophen 325 MG tablet Commonly known as:  TYLENOL Take 650 mg by mouth every 4 (four) hours as needed for mild pain.   BOOST PLUS PO Take 1 Can by mouth 3 (three) times daily. One can three times a day with meals   carbidopa-levodopa 25-100 MG tablet Commonly known as:   SINEMET IR Take 0.5 tablets by mouth 3 (three) times daily.   cyclobenzaprine 5 MG tablet Commonly known as:  FLEXERIL Take 1 tablet (5 mg total) by mouth 3 (three) times daily as needed for muscle spasms (muscle pain and stiffness).   feeding supplement (PRO-STAT SUGAR FREE 64) Liqd Take 30 mLs by mouth 2 (two) times daily. 30 ml twice daily to promote wound healing   fludrocortisone 0.1 MG tablet Commonly known as:  FLORINEF Take 0.5 tablets (0.05 mg total) by mouth daily.   hydrocortisone 10 MG tablet Commonly known as:  CORTEF Take 1 tablet (10 mg total) by mouth daily at 6 PM. Every evening   hydrocortisone 20 MG tablet Commonly known as:  CORTEF Take 1 tablet (20 mg total) by mouth every morning. Every morning   ibuprofen 400 MG tablet Commonly known as:  ADVIL,MOTRIN Take 1 tablet (400 mg total) by mouth every 6 (six) hours as needed for moderate pain.   lactulose 10 GM/15ML solution Commonly known as:  CHRONULAC Take 40 g by mouth daily as needed for mild constipation. Take 60 ml as needed for constipation   megestrol 40 MG/ML suspension Commonly known as:  MEGACE Take 63ml daily to help stimulate appetite   mirtazapine 15 MG tablet Commonly known as:  REMERON Take 30 mg by mouth at bedtime.   multivitamin tablet Take 1 tablet by mouth daily.   polyethylene glycol packet Commonly known as:  MIRALAX / GLYCOLAX Take 17 g by mouth daily as needed for mild constipation.   psyllium 58.6 % powder Commonly known as:  METAMUCIL Take 1 packet by mouth daily. One tablespoon in 4 oz liquid daily   ranitidine 150 MG tablet Commonly known as:  ZANTAC Take 150 mg by mouth at bedtime.   vitamin B-12 1000 MCG tablet Commonly known as:  CYANOCOBALAMIN Take 1,000 mcg by mouth daily.   zinc oxide 20 % ointment Apply 1 application topically 2 (two) times daily as needed for irritation.       Review of Systems  Constitutional: Positive for unexpected weight change  (losiing weight). Negative for chills, diaphoresis and fever.  HENT: Negative for congestion, ear pain and hearing loss.   Eyes: Negative for photophobia, pain, discharge and redness.  Respiratory: Negative for cough, shortness of breath and wheezing.   Cardiovascular: Positive for leg swelling. Negative for chest pain and palpitations.       BLE edema  Gastrointestinal: Positive for constipation and diarrhea. Negative for abdominal pain, blood in stool and nausea.  Genitourinary: Negative.   Musculoskeletal: Negative for back pain, myalgias and neck pain.  Skin:       Multiple seborrheic keratoses.  Corn on the tip pf the left 3nd toe.  Callus and Ulcer of the left fifth metatarsal area distally since mid-July 2017.  Neurological: Positive for seizures (Altered consciousness with sparing but no generalized tonic-clonic activity.) and weakness. Negative for tremors and headaches.  Complaint of loss of motor skills and changes in. He is unstable with walking. He has a known peripheral neuropathy of uncertain origin. Micrographia. Pressured, soft speech that is nearly inaudible. History of parkinsonism currently treated with Sinemet.  Hematological:       History of anemia  Psychiatric/Behavioral: The patient is nervous/anxious.     Immunization History  Administered Date(s) Administered  . DTaP 09/01/2007  . Influenza-Unspecified 07/04/2014, 05/19/2015  . Pneumococcal Polysaccharide-23 06/11/2002   Pertinent  Health Maintenance Due  Topic Date Due  . PNA vac Low Risk Adult (2 of 2 - PCV13) 06/12/2003  . INFLUENZA VACCINE  03/20/2016   Fall Risk  04/03/2016 02/14/2016 01/24/2016 01/24/2016 10/25/2015  Falls in the past year? No No Yes No No  Number falls in past yr: - - 2 or more - -  Injury with Fall? - - No - -  Risk Factor Category  - - - - -  Risk for fall due to : - - - - -  Risk for fall due to (comments): - - - - -  Follow up - - - - -   Functional Status Survey:     Vitals:   05/01/16 1531 05/01/16 1535  BP: (!) 156/80 (!) 98/56  Pulse: 88   Resp: 16   Temp: 98.9 F (37.2 C)   Weight: 138 lb 12.8 oz (63 kg)   Height: 5\' 10"  (1.778 m)    Body mass index is 19.92 kg/m. Physical Exam  Constitutional: He is oriented to person, place, and time.  Thin. Elderly. Protein calorie insufficiency with malnourishment.  HENT:  Right Ear: External ear normal.  Left Ear: External ear normal.  Nose: Nose normal.  Mouth/Throat: Oropharynx is clear and moist. No oropharyngeal exudate.  Eyes: Conjunctivae are normal. Pupils are equal, round, and reactive to light.  Neck: No JVD present. No tracheal deviation present. No thyromegaly present.  Cardiovascular: Normal rate, regular rhythm, normal heart sounds and intact distal pulses.  Exam reveals no gallop and no friction rub.   No murmur heard. Pulmonary/Chest: No respiratory distress. He has no wheezes. He has no rales. He exhibits no tenderness.  Abdominal: He exhibits no distension and no mass. There is no tenderness.  Musculoskeletal: Normal range of motion. He exhibits edema (1+ ipedal). He exhibits no tenderness.  unstable gait. 2+BLE edema.   Lymphadenopathy:    He has no cervical adenopathy.  Neurological: He is alert and oriented to person, place, and time. He displays abnormal reflex (Absent at both knees). No cranial nerve deficit. Coordination normal.  Cogwheeling most evident in the right wrist. Gait is mildly abnormal with a suggestion of a festinating gait. Speech is soft and mildly pressured. There is a mild tremor at rest. There is no focal weakness. Fails to extinguish glabellar reflex. 1/14//15 MMSE 30/30. Passed clock drawing test.  Skin: No rash noted. No erythema. No pallor.  Corn at the tip of the left s third toe is healing. Callus at the distal end of the left fifth metatarsal..  6 mm sacral decubitus   Psychiatric:  Patient appears mildly anxious.     Labs reviewed:  Recent  Labs  04/19/16 0434  04/23/16 0357 04/24/16 0654 04/26/16 0400  NA 141  < > 136 137 137  K 4.1  < > 3.7 3.9 3.7  CL 111  < > 109 110 110  CO2 22  < > 22 21* 21*  GLUCOSE 96  < > 117* 90 105*  BUN 26*  < > 32* 29* 27*  CREATININE 1.16  < > 1.19 0.96 0.96  CALCIUM 9.0  < > 8.5* 8.8* 8.6*  MG 2.1  --   --   --   --   < > = values in this interval not displayed.  Recent Labs  04/18/16 1659 04/18/16 2232 04/19/16 0434  AST 17 24 23   ALT 13 9* 19  ALKPHOS 46 44 43  BILITOT 0.3 0.3 0.4  PROT 6.0 6.1* 5.9*  ALBUMIN 3.4* 3.3* 3.3*    Recent Labs  04/22/16 0523 04/23/16 0357 04/24/16 0654 04/26/16 0400  WBC 9.4 9.3 10.0 9.1  NEUTROABS 7.5 7.2 8.0*  --   HGB 10.2* 10.4* 10.7* 9.8*  HCT 31.5* 31.0* 32.6* 30.2*  MCV 95.2 94.2 94.8 95.3  PLT 161 164 149* 156   Lab Results  Component Value Date   TSH 2.297 04/24/2016   No results found for: HGBA1C Lab Results  Component Value Date   CHOL 166 05/24/2014   HDL 45 05/24/2014   LDLCALC 110 05/24/2014   TRIG 54 05/24/2014    Significant Diagnostic Results in last 30 days:  Dg Chest 2 View  Result Date: 04/18/2016 CLINICAL DATA:  Syncope.  Weakness and dyspnea tonight. EXAM: CHEST  2 VIEW COMPARISON:  None. FINDINGS: The lungs are clear. The pulmonary vasculature is normal. Heart size is normal. Hilar and mediastinal contours are unremarkable. There is no pleural effusion. IMPRESSION: No active cardiopulmonary disease. Electronically Signed   By: Andreas Newport M.D.   On: 04/18/2016 22:50    Assessment/Plan 1. Uncontrolled hypertension Wide fluctuations in BP. Thought related to his Parkinsonism.  2. Orthostatic hypotension Wear TEd. Currently on Forinef  3. Secondary adrenal insufficiency ([facility]) Appt with Dr. Dwyane Dee today  4. Edema, unspecified type Increased per patient and his son Etiology uncertain, but I think most likely venous insufficiency  5. Parkinson's disease ([place]) Appears to as well controlled on  the lower dose of Sinemet  6. Ulcer of foot, limited to breakdown of skin, unspecified laterality ([place]) Improved. Still with a lot of callus ot the left 5th head.  7. Abnormality of gait *complains of weakness of his legs, L>R.

## 2016-05-02 ENCOUNTER — Telehealth: Payer: Self-pay | Admitting: Neurology

## 2016-05-02 NOTE — Telephone Encounter (Signed)
[name] called regarding October 5th appointment, would like for patient to be seen by Dr. Jannifer Franklin ASAP, states patient has Parkinson's and legs are cramping up a lot and can't walk. Please call [phone].

## 2016-05-03 ENCOUNTER — Non-Acute Institutional Stay (SKILLED_NURSING_FACILITY): Payer: Medicare Other | Admitting: Nurse Practitioner

## 2016-05-03 ENCOUNTER — Encounter: Payer: Self-pay | Admitting: Nurse Practitioner

## 2016-05-03 DIAGNOSIS — K5900 Constipation, unspecified: Secondary | ICD-10-CM

## 2016-05-03 DIAGNOSIS — R569 Unspecified convulsions: Secondary | ICD-10-CM | POA: Diagnosis not present

## 2016-05-03 DIAGNOSIS — M25552 Pain in left hip: Secondary | ICD-10-CM

## 2016-05-03 DIAGNOSIS — R269 Unspecified abnormalities of gait and mobility: Secondary | ICD-10-CM

## 2016-05-03 DIAGNOSIS — E2749 Other adrenocortical insufficiency: Secondary | ICD-10-CM

## 2016-05-03 DIAGNOSIS — G20 Parkinson's disease: Secondary | ICD-10-CM | POA: Diagnosis not present

## 2016-05-03 DIAGNOSIS — R634 Abnormal weight loss: Secondary | ICD-10-CM | POA: Diagnosis not present

## 2016-05-03 DIAGNOSIS — N400 Enlarged prostate without lower urinary tract symptoms: Secondary | ICD-10-CM

## 2016-05-03 DIAGNOSIS — I951 Orthostatic hypotension: Secondary | ICD-10-CM

## 2016-05-03 DIAGNOSIS — D649 Anemia, unspecified: Secondary | ICD-10-CM | POA: Diagnosis not present

## 2016-05-03 DIAGNOSIS — K219 Gastro-esophageal reflux disease without esophagitis: Secondary | ICD-10-CM | POA: Diagnosis not present

## 2016-05-03 NOTE — Assessment & Plan Note (Signed)
[facility] 05/02/16, increased frailty contributory, close supervision for safety.

## 2016-05-03 NOTE — Assessment & Plan Note (Signed)
No active seizure activity since SNF admission

## 2016-05-03 NOTE — Assessment & Plan Note (Signed)
Obtain X-ray lumbar spine, left hip, left thigh, continue Tylenol and Ibuprofen prn for pain.

## 2016-05-03 NOTE — Assessment & Plan Note (Addendum)
Blood pressure is in control, continue Florinef 0.05mg  daily. 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 F/u Neurology.

## 2016-05-03 NOTE — Progress Notes (Signed)
Patient ID: Lawrence Turner, male   DOB: [birth year], 82 y.o.   MRN: [id number]  Location:  SNF  [facility]  Provider:  Marlana Latus NP  Code Status:  DNR  Goals of care: Advanced Directive information    Chief Complaint  Patient presents with  . Acute Visit    fall 05/02/16     HPI: Patient is a 82 y.o. male seen in the SNF at [facility] today for evaluation of s/p fall 05/02/16, c/o left hip thigh pain with movement, but still able to bear weight w/o worsening of pain, the patient stated his leg gave away and fell.    Hx of constipation, MiraLax, Lactulose prn, Metamucil daily,  08/19/15 Colon with water solution contrast showed excluding a complete sigmoid obstruction. Orthostatic hypotension, maintained on Florinef 0.05mg  daily. Parkinson's dx, on Sinemet 25/100 1/2 tid. Mood is appetite is manage with Mirtazapine 30mg , Megace.   Review of Systems  Constitutional: Negative for chills, diaphoresis and fever.  HENT: Negative for congestion, ear pain and hearing loss.   Eyes: Negative for photophobia, pain, discharge and redness.  Respiratory: Negative for cough, shortness of breath and wheezing.   Cardiovascular: Positive for leg swelling. Negative for chest pain and palpitations.       BLE edema  Gastrointestinal: Positive for constipation. Negative for abdominal pain, blood in stool, diarrhea and nausea.  Genitourinary: Negative.   Musculoskeletal: Positive for falls and joint pain. Negative for back pain and neck pain.       Left hip and thigh pain with movement.   Skin:       Multiple seborrheic keratoses. Corn on the tip pf the left 3rd toe that has a hemorrhage close to the nail.Marland Kitchen Has become much more painful since he stopped his toe while "working out".  Neurological: Positive for weakness. Negative for tremors, seizures and headaches.       Complaint of loss of motor skills and changes in. He is unstable with walking. He has a known peripheral neuropathy of uncertain  origin. Micrographia.  Endo/Heme/Allergies:       History of anemia  Psychiatric/Behavioral: The patient is nervous/anxious.     Past Medical History:  Diagnosis Date  . Abnormality of gait 10/19/2015  . Anemia, unspecified   . Benign neoplasm of colon    pre-cancerous polyps removed at colonoscopy 2008  . Contact dermatitis and other eczema, due to unspecified cause   . Diverticulosis of colon (without mention of hemorrhage)   . Esophageal reflux   . Hypertrophy of prostate without urinary obstruction and other lower urinary tract symptoms (LUTS)   . Mononeuritis of unspecified site   . Orthostatic hypotension 04/18/2016  . Osteoarthrosis, unspecified whether generalized or localized, pelvic region and thigh   . Other B-complex deficiencies   . Other pulmonary embolism and infarction    following cholecystectomy 2003  . Parkinson disease ([place]) 10/19/2015  . Peripheral neuropathy ([place])   . Pure hypercholesterolemia   . Rosacea   . Seizures ([place]) 04/17/2016  . 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 Syncope and collapse 04/18/2016  . Ulcer of foot ([place]) 04/03/2016  . Unspecified constipation   . Unspecified essential hypertension   . Unspecified gastritis and gastroduodenitis without mention of hemorrhage 08/29/2006  . Unspecified hemorrhoids without mention of complication   . Unspecified vitamin D deficiency     Patient Active Problem List   Diagnosis Date Noted  . Pressure ulcer stage II 04/21/2016  .  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 Syncope 04/19/2016  . Orthostatic hypotension 04/18/2016  . Seizures ([place]) 04/17/2016  . Ulcer of foot ([place]) 04/03/2016  . Weight loss 01/24/2016  . Hammer toe of left foot 10/25/2015  . Abnormality of gait 10/19/2015  . Left hip pain 08/09/2015  . Constipation 10/05/2014  . Corn 10/05/2014  . Parkinson's disease ([place]) 02/02/2014  . Uncontrolled hypertension   . Esophageal reflux   . Peripheral  neuropathy ([place])   . BPH (benign prostatic hyperplasia)   . Other B-complex deficiencies   . Normocytic anemia   . Unspecified vitamin D deficiency   . Pure hypercholesterolemia   . Benign neoplasm of colon     Allergies  Allergen Reactions  . Lyrica [Pregabalin]     *Anticonvulsants*, fatique    Medications: Patient's Medications  New Prescriptions   No medications on file  Previous Medications   ACETAMINOPHEN (TYLENOL) 325 MG TABLET    Take 650 mg by mouth every 4 (four) hours as needed for mild pain.    AMINO ACIDS-PROTEIN HYDROLYS (FEEDING SUPPLEMENT, PRO-STAT SUGAR FREE 64,) LIQD    Take 30 mLs by mouth 2 (two) times daily. 30 ml twice daily to promote wound healing    CARBIDOPA-LEVODOPA (SINEMET IR) 25-100 MG TABLET    Take 0.5 tablets by mouth 3 (three) times daily.   CYCLOBENZAPRINE (FLEXERIL) 5 MG TABLET    Take 1 tablet (5 mg total) by mouth 3 (three) times daily as needed for muscle spasms (muscle pain and stiffness).   FLUDROCORTISONE (FLORINEF) 0.1 MG TABLET    Take 0.5 tablets (0.05 mg total) by mouth daily.   HYDROCORTISONE (CORTEF) 10 MG TABLET    Take 1 tablet (10 mg total) by mouth daily at 6 PM. Every evening   HYDROCORTISONE (CORTEF) 20 MG TABLET    Take 1 tablet (20 mg total) by mouth every morning. Every morning   IBUPROFEN (ADVIL,MOTRIN) 400 MG TABLET    Take 1 tablet (400 mg total) by mouth every 6 (six) hours as needed for moderate pain.   LACTULOSE (CHRONULAC) 10 GM/15ML SOLUTION    Take 40 g by mouth daily as needed for mild constipation. Take 60 ml as needed for constipation    MEGESTROL (MEGACE) 40 MG/ML SUSPENSION    Take 95ml daily to help stimulate appetite   MIRTAZAPINE (REMERON) 15 MG TABLET    Take 30 mg by mouth at bedtime.    MULTIPLE VITAMIN (MULTIVITAMIN) TABLET    Take 1 tablet by mouth daily.   NUTRITIONAL SUPPLEMENTS (BOOST PLUS PO)    Take 1 Can by mouth 3 (three) times daily. One can three times a day with meals    POLYETHYLENE GLYCOL  (MIRALAX / GLYCOLAX) PACKET    Take 17 g by mouth daily as needed for mild constipation.   PSYLLIUM (METAMUCIL) 58.6 % POWDER    Take 1 packet by mouth daily. One tablespoon in 4 oz liquid daily    RANITIDINE (ZANTAC) 150 MG TABLET    Take 150 mg by mouth at bedtime.    VITAMIN B-12 (CYANOCOBALAMIN) 1000 MCG TABLET    Take 1,000 mcg by mouth daily.   ZINC OXIDE 20 % OINTMENT    Apply 1 application topically 2 (two) times daily as needed for irritation.  Modified Medications   No medications on file  Discontinued Medications   No medications on file    Physical Exam: Vitals:   05/03/16 1411  BP: 131/85  Pulse: 73  Resp: 18  Temp: 98.4 F (36.9 C)  SpO2: 94%   There is no height or weight on file to calculate BMI.  Physical Exam  Constitutional: He is oriented to person, place, and time.  Thin. Elderly.  HENT:  Right Ear: External ear normal.  Left Ear: External ear normal.  Nose: Nose normal.  Mouth/Throat: Oropharynx is clear and moist. No oropharyngeal exudate.  Eyes: Conjunctivae are normal. Pupils are equal, round, and reactive to light.  Neck: No JVD present. No tracheal deviation present. No thyromegaly present.  Cardiovascular: Normal rate, regular rhythm, normal heart sounds and intact distal pulses.  Exam reveals no gallop and no friction rub.   No murmur heard. Pulmonary/Chest: No respiratory distress. He has no wheezes. He has no rales. He exhibits no tenderness.  Abdominal: He exhibits no distension and no mass. There is no tenderness.  Musculoskeletal: Normal range of motion. He exhibits edema (1+ ipedal) and tenderness.  uunstable gait. Trace edema BLE. Pain with movement of the left hip and thigh  Lymphadenopathy:    He has no cervical adenopathy.  Neurological: He is alert and oriented to person, place, and time. He displays abnormal reflex (Absent at both knees). No cranial nerve deficit. Coordination normal.  Cogwheeling most evident in the right wrist. Gait  is mildly abnormal with a suggestion of a festinating gait. Speech is soft and mildly pressured. There is a mild tremor at rest. There is no focal weakness. 1/14//15 MMSE 30/30. Passed clock drawing test.  Skin: No rash noted. No erythema. No pallor.  Psychiatric:  Patient appears mildly anxious.     Labs reviewed: Basic Metabolic Panel:  Recent Labs  04/19/16 0434  04/23/16 0357 04/24/16 0654 04/26/16 0400  NA 141  < > 136 137 137  K 4.1  < > 3.7 3.9 3.7  CL 111  < > 109 110 110  CO2 22  < > 22 21* 21*  GLUCOSE 96  < > 117* 90 105*  BUN 26*  < > 32* 29* 27*  CREATININE 1.16  < > 1.19 0.96 0.96  CALCIUM 9.0  < > 8.5* 8.8* 8.6*  MG 2.1  --   --   --   --   < > = values in this interval not displayed.  Liver Function Tests:  Recent Labs  04/18/16 1659 04/18/16 2232 04/19/16 0434  AST 17 24 23   ALT 13 9* 19  ALKPHOS 46 44 43  BILITOT 0.3 0.3 0.4  PROT 6.0 6.1* 5.9*  ALBUMIN 3.4* 3.3* 3.3*    CBC:  Recent Labs  04/22/16 0523 04/23/16 0357 04/24/16 0654 04/26/16 0400  WBC 9.4 9.3 10.0 9.1  NEUTROABS 7.5 7.2 8.0*  --   HGB 10.2* 10.4* 10.7* 9.8*  HCT 31.5* 31.0* 32.6* 30.2*  MCV 95.2 94.2 94.8 95.3  PLT 161 164 149* 156    Lab Results  Component Value Date   TSH 2.297 04/24/2016   No results found for: HGBA1C Lab Results  Component Value Date   CHOL 166 05/24/2014   HDL 45 05/24/2014   LDLCALC 110 05/24/2014   TRIG 54 05/24/2014    Significant Diagnostic Results since last visit: none  Patient Care Team: Estill Dooms, MD as PCP - General (Internal Medicine) Francelia Mclaren Otho Darner, NP as Nurse Practitioner (Nurse Practitioner) Kathrynn Ducking, MD as Consulting Physician (Neurology)  Assessment/Plan Problem List Items Addressed This Visit    Weight loss (Chronic)    Continue Megace  Seizures ([place])    No active seizure activity since SNF admission      Secondary adrenal insufficiency ([place])    F/u endocrinology in a month.       Parkinson's  disease ([place]) (Chronic)    Cogwheeling of the right wrist. Pressured speech. Soft-spoken. Change in handwriting. Unstable gait. Continue Sinemet       Orthostatic hypotension    Blood pressure is in control, continue Florinef 0.05mg  daily. 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 F/u Neurology.       Normocytic anemia    Hgb 9.9, his baseline 10s, continue Vit B12      Left hip pain    Obtain X-ray lumbar spine, left hip, left thigh, continue Tylenol and Ibuprofen prn for pain.       Esophageal reflux    Stable, continue Protonix daily.       Constipation (Chronic)    Stable, continue prn MiraLax, Lactulose, daily Metamucil.       BPH (benign prostatic hyperplasia)    No urinary retention.       Abnormality of gait    [place] 05/02/16, increased frailty contributory, close supervision for safety.        Other Visit Diagnoses   None.      Family/ staff Communication: hx of constipation.   Labs/tests ordered: X-ray lumbar spine, left hip, left thigh  [facility] Cyndie Woodbeck NP [facility] [address], [place] [address] On Call:  [phone] & follow prompts after 5pm & weekends Office Phone:  [phone] Office Fax:  [phone]

## 2016-05-03 NOTE — Assessment & Plan Note (Signed)
Hgb 9.9, his baseline 10s, continue Vit B12

## 2016-05-03 NOTE — Assessment & Plan Note (Signed)
Stable, continue Protonix daily.  

## 2016-05-03 NOTE — Assessment & Plan Note (Signed)
Stable, continue prn MiraLax, Lactulose, daily Metamucil.

## 2016-05-03 NOTE — Assessment & Plan Note (Signed)
Continue Megace

## 2016-05-03 NOTE — Assessment & Plan Note (Signed)
No urinary retention.  

## 2016-05-03 NOTE — Assessment & Plan Note (Signed)
F/u endocrinology in a month.

## 2016-05-03 NOTE — Assessment & Plan Note (Signed)
Cogwheeling of the right wrist. Pressured speech. Soft-spoken. Change in handwriting. Unstable gait. Continue Sinemet

## 2016-05-04 DIAGNOSIS — M79652 Pain in left thigh: Secondary | ICD-10-CM | POA: Diagnosis not present

## 2016-05-04 DIAGNOSIS — M25552 Pain in left hip: Secondary | ICD-10-CM | POA: Diagnosis not present

## 2016-05-05 DIAGNOSIS — M545 Low back pain: Secondary | ICD-10-CM | POA: Diagnosis not present

## 2016-05-07 NOTE — Telephone Encounter (Signed)
Returned call and spoke to pt's son. Sooner appt scheduled for tomorrow. Agreed to arrive 15 min prior to appt time.

## 2016-05-08 ENCOUNTER — Encounter: Payer: Self-pay | Admitting: Neurology

## 2016-05-08 ENCOUNTER — Ambulatory Visit (INDEPENDENT_AMBULATORY_CARE_PROVIDER_SITE_OTHER): Payer: Medicare Other | Admitting: Neurology

## 2016-05-08 VITALS — BP 132/60 | HR 84 | Ht 70.0 in | Wt 138.0 lb

## 2016-05-08 DIAGNOSIS — Z5181 Encounter for therapeutic drug level monitoring: Secondary | ICD-10-CM | POA: Diagnosis not present

## 2016-05-08 MED ORDER — CARBIDOPA-LEVODOPA 25-100 MG PO TABS: 1.0000 | ORAL_TABLET | Freq: Three times a day (TID) | ORAL | Status: DC

## 2016-05-08 NOTE — Patient Instructions (Signed)
We will go back on Sinemet 25/100 mg tablet three times a day.

## 2016-05-08 NOTE — Progress Notes (Signed)
Reason for visit:  Parkinson's disease  Lawrence Turner is an 82 y.o. male  History of present illness:   Lawrence Turner is an 82 year old left-handed white male with a history of Parkinson's disease. The patient was found to be significantly orthostatic when last seen on April 18 2016 resulting in episodes of altered awareness and syncope. The patient went into the hospital, he was found to be addisonian. The patient has been placed on hydrocortisone taking a total of 30 mg a day. He has also been placed on low-doe of Florinef taking one half of a 0.1 mg tablet daily. The Sinemet was cut back to half of a 25/100 mg tablet 3 times daily, the patient has had some issues with cramping in the legs, and decreased ability to walk. The patient is in an extended care facility and he is getting physical therapy for ambulation. The patient has had some issues with depression, he is on low-dose Remeron 15 mg at night. The patient is eating better, his appetite has improved some. The patient returns to this office for an evaluation.  Past Medical History:  Diagnosis Date  . Abnormality of gait 10/19/2015  . Anemia, unspecified   . Benign neoplasm of colon    pre-cancerous polyps removed at colonoscopy 2008  . Contact dermatitis and other eczema, due to unspecified cause   . Diverticulosis of colon (without mention of hemorrhage)   . Esophageal reflux   . Hypertrophy of prostate without urinary obstruction and other lower urinary tract symptoms (LUTS)   . Mononeuritis of unspecified site   . Orthostatic hypotension 04/18/2016  . Osteoarthrosis, unspecified whether generalized or localized, pelvic region and thigh   . Other B-complex deficiencies   . Other pulmonary embolism and infarction    following cholecystectomy 2003  . Parkinson disease ([place]) 10/19/2015  . Peripheral neuropathy ([place])   . Pure hypercholesterolemia   . Rosacea   . Seizures ([place]) 04/17/2016  . Spondylosis of unspecified site without  mention of myelopathy   . Syncope and collapse 04/18/2016  . Ulcer of foot ([place]) 04/03/2016  . Unspecified constipation   . Unspecified essential hypertension   . Unspecified gastritis and gastroduodenitis without mention of hemorrhage 08/29/2006  . Unspecified hemorrhoids without mention of complication   . Unspecified vitamin D deficiency     Past Surgical History:  Procedure Laterality Date  . CATARACT EXTRACTION W/ INTRAOCULAR LENS IMPLANT Left 5/20//2013  . CHOLECYSTECTOMY, LAPAROSCOPIC  2003  . colonic polyp removal  1965  . COLONOSCOPY  08/29/2006   hemorrhoids, 3 small polyps (hyperplastic) & diverticulosis  . HEMORRHOID SURGERY  1953    Family History  Problem Relation Age of Onset  . Cancer Mother     breast  . Pneumonia Father   . Cancer Brother     Social history:  reports that he quit smoking about 53 years ago. His smoking use included Cigarettes. He quit after 15.00 years of use. He has never used smokeless tobacco. He reports that he drinks about 1.2 oz of alcohol per week . He reports that he does not use drugs.    Allergies  Allergen Reactions  . Lyrica [Pregabalin]     *Anticonvulsants*, fatique    Medications:  Prior to Admission medications   Medication Sig Start Date End Date Taking? Authorizing Provider  acetaminophen (TYLENOL) 325 MG tablet Take 650 mg by mouth 2 (two) times daily.  05/01/16  Yes Historical Provider, MD  Amino Acids-Protein Hydrolys (FEEDING SUPPLEMENT, PRO-STAT  SUGAR FREE 64,) LIQD Take 30 mLs by mouth 2 (two) times daily. 30 ml twice daily to promote wound healing    Yes Historical Provider, MD  carbidopa-levodopa (SINEMET IR) 25-100 MG tablet Take 0.5 tablets by mouth 3 (three) times daily. 04/30/16  Yes Domenic Polite, MD  cyclobenzaprine (FLEXERIL) 5 MG tablet Take 1 tablet (5 mg total) by mouth 3 (three) times daily as needed for muscle spasms (muscle pain and stiffness). 04/30/16  Yes Domenic Polite, MD  fludrocortisone (FLORINEF)  0.1 MG tablet Take 0.5 tablets (0.05 mg total) by mouth daily. 04/30/16  Yes Domenic Polite, MD  hydrocortisone (CORTEF) 10 MG tablet Take 1 tablet (10 mg total) by mouth daily at 6 PM. Every evening 04/30/16  Yes Domenic Polite, MD  hydrocortisone (CORTEF) 20 MG tablet Take 1 tablet (20 mg total) by mouth every morning. Every morning 04/30/16  Yes Domenic Polite, MD  ibuprofen (ADVIL,MOTRIN) 400 MG tablet Take 1 tablet (400 mg total) by mouth every 6 (six) hours as needed for moderate pain. 04/30/16  Yes Domenic Polite, MD  lactulose (CHRONULAC) 10 GM/15ML solution Take 40 g by mouth daily as needed for mild constipation. Take 60 ml as needed for constipation    Yes Historical Provider, MD  megestrol (MEGACE) 40 MG/ML suspension Take 66ml daily to help stimulate appetite 03/20/16  Yes Historical Provider, MD  mirtazapine (REMERON) 15 MG tablet Take 30 mg by mouth at bedtime.    Yes Historical Provider, MD  Multiple Vitamin (MULTIVITAMIN) tablet Take 1 tablet by mouth daily.   Yes Historical Provider, MD  Nutritional Supplements (BOOST PLUS PO) Take 1 Can by mouth 3 (three) times daily. One can three times a day with meals    Yes Historical Provider, MD  polyethylene glycol (MIRALAX / GLYCOLAX) packet Take 17 g by mouth daily as needed for mild constipation.   Yes Historical Provider, MD  psyllium (METAMUCIL) 58.6 % powder Take 1 packet by mouth daily. One tablespoon in 4 oz liquid daily    Yes Historical Provider, MD  ranitidine (ZANTAC) 150 MG tablet Take 150 mg by mouth at bedtime.  01/31/16  Yes Historical Provider, MD  vitamin B-12 (CYANOCOBALAMIN) 1000 MCG tablet Take 1,000 mcg by mouth daily.   Yes Historical Provider, MD  zinc oxide 20 % ointment Apply 1 application topically 2 (two) times daily as needed for irritation.   Yes Historical Provider, MD    ROS:  Out of a complete 14 system review of symptoms, the patient complains only of the following symptoms, and all other reviewed systems are  negative.   Fatigue  Shortness of breath  Speech difficulty  Blood pressure (!) 142/78, pulse 84, height 5\' 10"  (1.778 m), weight 138 lb (62.6 kg).  Physical Exam  General: The patient is alert and cooperative at the time of the examination.  Skin: 2+ edema of the ankles is seen.   Neurologic Exam  Mental status: The patient is alert and oriented x 3 at the time of the examination. The patient has apparent normal recent and remote memory, with an apparently normal attention span and concentration ability.   Cranial nerves: Facial symmetry is present. Speech is normal, no aphasia or dysarthria is noted. Extraocular movements are full. Visual fields are full. Masking of the face is seen.  Motor: The patient has good strength in all 4 extremities.  Sensory examination: Soft touch sensation is symmetric on the face, arms, and legs.  Coordination: The patient has good finger-nose-finger and  heel-to-shin bilaterally.  Gait and station: The patient requires some assistance with standing. He is able to walk short distances with assistance.  Reflexes: Deep tendon reflexes are symmetric.   Assessment/Plan:   1. Parkinson's disease   2. Addison's disease   3. Orthostatic hypotension   4. Gait disorder   The patient seems to be doing better with his orthostatic hypotension with medications and with supplementation with hydrocortisone. The patient will be followed by an endocrinologist. The patient will be increased on Sinemet taking 25/100 mg tablet 3 times daily. Hopefully this will help his ability to ambulate and help his leg cramps. The patient will have blood work done today. He will follow-up in 2 months. He will continue physical therapy.  Jill Alexanders MD 05/08/2016 4:11 PM  [facility] [address] [place], [place] [address]  Phone [phone] Fax [phone]

## 2016-05-09 ENCOUNTER — Telehealth: Payer: Self-pay | Admitting: Neurology

## 2016-05-09 LAB — COMPREHENSIVE METABOLIC PANEL
ALBUMIN: 3.2 g/dL — AB (ref 3.5–4.7)
ALT: 30 IU/L (ref 0–44)
AST: 27 IU/L (ref 0–40)
Albumin/Globulin Ratio: 1.6 (ref 1.2–2.2)
Alkaline Phosphatase: 39 IU/L (ref 39–117)
BILIRUBIN TOTAL: 0.3 mg/dL (ref 0.0–1.2)
BUN / CREAT RATIO: 32 — AB (ref 10–24)
BUN: 33 mg/dL — AB (ref 8–27)
CALCIUM: 8.2 mg/dL — AB (ref 8.6–10.2)
CO2: 22 mmol/L (ref 18–29)
CREATININE: 1.03 mg/dL (ref 0.76–1.27)
Chloride: 106 mmol/L (ref 96–106)
GFR, EST AFRICAN AMERICAN: 75 mL/min/{1.73_m2} (ref >59)
GFR, EST NON AFRICAN AMERICAN: 65 mL/min/{1.73_m2} (ref >59)
GLUCOSE: 153 mg/dL — AB (ref 65–99)
Globulin, Total: 2 g/dL (ref 1.5–4.5)
Potassium: 3.6 mmol/L (ref 3.5–5.2)
Sodium: 141 mmol/L (ref 134–144)
TOTAL PROTEIN: 5.2 g/dL — AB (ref 6.0–8.5)

## 2016-05-09 NOTE — Telephone Encounter (Signed)
I called the son. The blood work showed a normal sodium level, TP and Alb are slightly low. Overall OK. BUN is up slightly.

## 2016-05-15 ENCOUNTER — Non-Acute Institutional Stay (SKILLED_NURSING_FACILITY): Payer: Medicare Other | Admitting: Nurse Practitioner

## 2016-05-15 ENCOUNTER — Encounter: Payer: Self-pay | Admitting: Nurse Practitioner

## 2016-05-15 DIAGNOSIS — I951 Orthostatic hypotension: Secondary | ICD-10-CM

## 2016-05-15 DIAGNOSIS — K219 Gastro-esophageal reflux disease without esophagitis: Secondary | ICD-10-CM | POA: Diagnosis not present

## 2016-05-15 DIAGNOSIS — R609 Edema, unspecified: Secondary | ICD-10-CM

## 2016-05-15 DIAGNOSIS — G629 Polyneuropathy, unspecified: Secondary | ICD-10-CM | POA: Diagnosis not present

## 2016-05-15 DIAGNOSIS — N400 Enlarged prostate without lower urinary tract symptoms: Secondary | ICD-10-CM | POA: Diagnosis not present

## 2016-05-15 DIAGNOSIS — G20 Parkinson's disease: Secondary | ICD-10-CM

## 2016-05-15 DIAGNOSIS — K5900 Constipation, unspecified: Secondary | ICD-10-CM | POA: Diagnosis not present

## 2016-05-15 DIAGNOSIS — L84 Corns and callosities: Secondary | ICD-10-CM

## 2016-05-15 DIAGNOSIS — L97501 Non-pressure chronic ulcer of other part of unspecified foot limited to breakdown of skin: Secondary | ICD-10-CM

## 2016-05-15 LAB — BASIC METABOLIC PANEL
BUN: 20 mg/dL (ref 4–21)
CREATININE: 0.9 mg/dL (ref <=1.3)
GLUCOSE: 80 mg/dL
Potassium: 3.3 mmol/L — AB (ref 3.4–5.3)
SODIUM: 141 mmol/L (ref 137–147)

## 2016-05-15 LAB — CBC AND DIFFERENTIAL
HEMATOCRIT: 28 % — AB (ref 41–53)
Hemoglobin: 93 g/dL — AB (ref 13.5–17.5)
Platelets: 167 10*3/uL (ref 150–399)
WBC: 6.4 10^3/mL

## 2016-05-15 LAB — HEPATIC FUNCTION PANEL
ALK PHOS: 27 U/L (ref 25–125)
ALT: 29 U/L (ref 10–40)
AST: 22 U/L (ref 14–40)
BILIRUBIN, TOTAL: 0.5 mg/dL

## 2016-05-15 NOTE — Assessment & Plan Note (Signed)
Stable, continue Protonix daily.  

## 2016-05-15 NOTE — Assessment & Plan Note (Addendum)
Healed

## 2016-05-15 NOTE — Assessment & Plan Note (Signed)
Blood pressure is elevated at tims, continue Florinef 0.05mg  daily, hold for Bp>160/90.  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 F/u Neurology.

## 2016-05-15 NOTE — Progress Notes (Signed)
Location:  [facility] Room Number: 36 Place of Service:  SNF (31) Provider:  Culley Hedeen, Manxie  NP  Jeanmarie Hubert, MD  Patient Care Team: Estill Dooms, MD as PCP - General (Internal Medicine) Tamula Morrical Otho Darner, NP as Nurse Practitioner (Nurse Practitioner) Kathrynn Ducking, MD as Consulting Physician (Neurology)  Extended Emergency Contact Information Primary Emergency Contact: Lucita Ferrara Address: [address]           [place], [place] [address] [name] of [place] Phone: [phone] Relation: Spouse Secondary Emergency Contact: Doiron,Clifford  [place] Phone: [phone] Relation: Son  Code Status:  DNR Goals of care: Advanced Directive information Advanced Directives 05/15/2016  Does patient have an advance directive? Yes  Type of Advance Directive Out of facility DNR (pink MOST or yellow form)  Does patient want to make changes to advanced directive? No - Patient declined  Copy of advanced directive(s) in chart? Yes     Chief Complaint  Patient presents with  . Acute Visit    bilateral lower extremities swollen, left foot has redness and callous skin on the sole     HPI:  Pt is a 82 y.o. male seen today for an acute visit for chronic BLE edema, not on diuretics, daily weight showed fluctuating, denied SOB, cough, O2 desaturation, phlegm production, PND, chest pain, or palpitation.    Hx of constipation, MiraLax, Lactulose prn, Metamucil daily,  08/19/15 Colon with water solution contrast showed excluding a complete sigmoid obstruction. Orthostatic hypotension, maintained on Florinef 0.05mg  daily. Parkinson's dx, on Sinemet 25/100 1/2 tid. Mood is appetite is manage with Mirtazapine 30mg , Megace. A corn seen left plantar near the left 5th toe.     Past Medical History:  Diagnosis Date  . Abnormality of gait 10/19/2015  . Anemia, unspecified   . Benign neoplasm of colon    pre-cancerous polyps removed at  colonoscopy 2008  . Contact dermatitis and other eczema, due to unspecified cause   . Diverticulosis of colon (without mention of hemorrhage)   . Esophageal reflux   . Hypertrophy of prostate without urinary obstruction and other lower urinary tract symptoms (LUTS)   . Mononeuritis of unspecified site   . Orthostatic hypotension 04/18/2016  . Osteoarthrosis, unspecified whether generalized or localized, pelvic region and thigh   . Other B-complex deficiencies   . Other pulmonary embolism and infarction    following cholecystectomy 2003  . Parkinson disease ([place]) 10/19/2015  . Peripheral neuropathy ([place])   . Pure hypercholesterolemia   . Rosacea   . Seizures ([place]) 04/17/2016  . 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 Syncope and collapse 04/18/2016  . Ulcer of foot ([place]) 04/03/2016  . Unspecified constipation   . Unspecified essential hypertension   . Unspecified gastritis and gastroduodenitis without mention of hemorrhage 08/29/2006  . Unspecified hemorrhoids without mention of complication   . Unspecified vitamin D deficiency    Past Surgical History:  Procedure Laterality Date  . CATARACT EXTRACTION W/ INTRAOCULAR LENS IMPLANT Left 5/20//2013  . CHOLECYSTECTOMY, LAPAROSCOPIC  2003  . colonic polyp removal  1965  . COLONOSCOPY  08/29/2006   hemorrhoids, 3 small polyps (hyperplastic) & diverticulosis  . HEMORRHOID SURGERY  1953    Allergies  Allergen Reactions  . Lyrica [Pregabalin]     *Anticonvulsants*, fatique      Medication List       Accurate as of 05/15/16  2:30 PM. Always use your most  recent med list.          acetaminophen 325 MG tablet Commonly known as:  TYLENOL Take 650 mg by mouth 2 (two) times daily.   BOOST PLUS PO Take 1 Can by mouth 3 (three) times daily. One can three times a day with meals   carbidopa-levodopa 25-100 MG tablet Commonly known as:  SINEMET IR Take 1 tablet by mouth 3 (three) times daily.   cyclobenzaprine  5 MG tablet Commonly known as:  FLEXERIL Take 1 tablet (5 mg total) by mouth 3 (three) times daily as needed for muscle spasms (muscle pain and stiffness).   feeding supplement (PRO-STAT SUGAR FREE 64) Liqd Take 30 mLs by mouth 2 (two) times daily. 30 ml twice daily to promote wound healing   fludrocortisone 0.1 MG tablet Commonly known as:  FLORINEF Take 0.5 tablets (0.05 mg total) by mouth daily.   hydrocortisone 10 MG tablet Commonly known as:  CORTEF Take 1 tablet (10 mg total) by mouth daily at 6 PM. Every evening   hydrocortisone 20 MG tablet Commonly known as:  CORTEF Take 1 tablet (20 mg total) by mouth every morning. Every morning   ibuprofen 400 MG tablet Commonly known as:  ADVIL,MOTRIN Take 1 tablet (400 mg total) by mouth every 6 (six) hours as needed for moderate pain.   lactulose 10 GM/15ML solution Commonly known as:  CHRONULAC Take 40 g by mouth daily as needed for mild constipation. Take 60 ml as needed for constipation   megestrol 40 MG/ML suspension Commonly known as:  MEGACE Take 54ml daily to help stimulate appetite   mirtazapine 15 MG tablet Commonly known as:  REMERON Take 15 mg by mouth at bedtime.   multivitamin tablet Take 1 tablet by mouth daily.   polyethylene glycol packet Commonly known as:  MIRALAX / GLYCOLAX Take 17 g by mouth daily as needed for mild constipation.   psyllium 58.6 % powder Commonly known as:  METAMUCIL Take 1 packet by mouth daily. One tablespoon in 4 oz liquid daily   ranitidine 150 MG tablet Commonly known as:  ZANTAC Take 150 mg by mouth at bedtime.   vitamin B-12 1000 MCG tablet Commonly known as:  CYANOCOBALAMIN Take 1,000 mcg by mouth daily.   zinc oxide 20 % ointment Apply 1 application topically 2 (two) times daily as needed for irritation.       Review of Systems  Constitutional: Positive for unexpected weight change (losiing weight). Negative for chills, diaphoresis and fever.  HENT: Negative for  congestion, ear pain and hearing loss.   Eyes: Negative for photophobia, pain, discharge and redness.  Respiratory: Negative for cough, shortness of breath and wheezing.   Cardiovascular: Positive for leg swelling. Negative for chest pain and palpitations.       BLE edema  Gastrointestinal: Positive for constipation and diarrhea. Negative for abdominal pain, blood in stool and nausea.  Genitourinary: Negative.   Musculoskeletal: Negative for back pain, myalgias and neck pain.  Skin:       Multiple seborrheic keratoses.  Corn on the plantar aspect of the left fifth metatarsal area distally since mid-July 2017.  Neurological: Positive for seizures (Altered consciousness with sparing but no generalized tonic-clonic activity.) and weakness. Negative for tremors and headaches.       Complaint of loss of motor skills and changes in. He is unstable with walking. He has a known peripheral neuropathy of uncertain origin. Micrographia. Pressured, soft speech that is nearly inaudible. History of parkinsonism currently  treated with Sinemet.  Hematological:       History of anemia  Psychiatric/Behavioral: The patient is nervous/anxious.     Immunization History  Administered Date(s) Administered  . DTaP 09/01/2007  . Influenza-Unspecified 07/04/2014, 05/19/2015  . Pneumococcal Polysaccharide-23 06/11/2002   Pertinent  Health Maintenance Due  Topic Date Due  . PNA vac Low Risk Adult (2 of 2 - PCV13) 06/12/2003  . INFLUENZA VACCINE  03/20/2016   Fall Risk  04/03/2016 02/14/2016 01/24/2016 01/24/2016 10/25/2015  Falls in the past year? No No Yes No No  Number falls in past yr: - - 2 or more - -  Injury with Fall? - - No - -  Risk Factor Category  - - - - -  Risk for fall due to : - - - - -  Risk for fall due to (comments): - - - - -  Follow up - - - - -   Functional Status Survey:    Vitals:   05/15/16 1330  BP: (!) 178/80  Pulse: 72  Resp: 16  Temp: 99.3 F (37.4 C)  SpO2: 97%  Weight:  142 lb 11.2 oz (64.7 kg)  Height: 5\' 10"  (1.778 m)   Body mass index is 20.48 kg/m. Physical Exam  Constitutional: He is oriented to person, place, and time.  Thin. Elderly.  HENT:  Right Ear: External ear normal.  Left Ear: External ear normal.  Nose: Nose normal.  Mouth/Throat: Oropharynx is clear and moist. No oropharyngeal exudate.  Eyes: Conjunctivae are normal. Pupils are equal, round, and reactive to light.  Neck: No JVD present. No tracheal deviation present. No thyromegaly present.  Cardiovascular: Normal rate, regular rhythm, normal heart sounds and intact distal pulses.  Exam reveals no gallop and no friction rub.   No murmur heard. Pulmonary/Chest: No respiratory distress. He has no wheezes. He has no rales. He exhibits no tenderness.  Abdominal: He exhibits no distension and no mass. There is no tenderness.  Musculoskeletal: Normal range of motion. He exhibits edema (1+ ipedal) and tenderness.  uunstable gait. Trace edema BLE. Pain with movement of the left hip and thigh  Lymphadenopathy:    He has no cervical adenopathy.  Neurological: He is alert and oriented to person, place, and time. He displays abnormal reflex (Absent at both knees). No cranial nerve deficit. Coordination normal.  Cogwheeling most evident in the right wrist. Gait is mildly abnormal with a suggestion of a festinating gait. Speech is soft and mildly pressured. There is a mild tremor at rest. There is no focal weakness. 1/14//15 MMSE 30/30. Passed clock drawing test.  Skin: No rash noted. No erythema. No pallor.  A corn at the plantar aspect of the the left 5th MTJ  Psychiatric:  Patient appears mildly anxious.     Labs reviewed:  Recent Labs  04/19/16 0434  04/24/16 0654 04/26/16 0400 05/08/16 1653  NA 141  < > 137 137 141  K 4.1  < > 3.9 3.7 3.6  CL 111  < > 110 110 106  CO2 22  < > 21* 21* 22  GLUCOSE 96  < > 90 105* 153*  BUN 26*  < > 29* 27* 33*  CREATININE 1.16  < > 0.96 0.96 1.03    CALCIUM 9.0  < > 8.8* 8.6* 8.2*  MG 2.1  --   --   --   --   < > = values in this interval not displayed.  Recent Labs  04/18/16 2232 04/19/16  0434 05/08/16 1653  AST 24 23 27   ALT 9* 19 30  ALKPHOS 44 43 39  BILITOT 0.3 0.4 0.3  PROT 6.1* 5.9* 5.2*  ALBUMIN 3.3* 3.3* 3.2*    Recent Labs  04/22/16 0523 04/23/16 0357 04/24/16 0654 04/26/16 0400  WBC 9.4 9.3 10.0 9.1  NEUTROABS 7.5 7.2 8.0*  --   HGB 10.2* 10.4* 10.7* 9.8*  HCT 31.5* 31.0* 32.6* 30.2*  MCV 95.2 94.2 94.8 95.3  PLT 161 164 149* 156   Lab Results  Component Value Date   TSH 2.297 04/24/2016   No results found for: HGBA1C Lab Results  Component Value Date   CHOL 166 05/24/2014   HDL 45 05/24/2014   LDLCALC 110 05/24/2014   TRIG 54 05/24/2014    Significant Diagnostic Results in last 30 days:  Dg Chest 2 View  Result Date: 04/18/2016 CLINICAL DATA:  Syncope.  Weakness and dyspnea tonight. EXAM: CHEST  2 VIEW COMPARISON:  None. FINDINGS: The lungs are clear. The pulmonary vasculature is normal. Heart size is normal. Hilar and mediastinal contours are unremarkable. There is no pleural effusion. IMPRESSION: No active cardiopulmonary disease. Electronically Signed   By: Andreas Newport M.D.   On: 04/18/2016 22:50    Assessment/Plan There are no diagnoses linked to this encounter.Ulcer of foot ([place]) Healed.    Orthostatic hypotension Blood pressure is elevated at tims, continue Florinef 0.05mg  daily, hold for Bp>160/90.  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 F/u Neurology.    Constipation Stable, continue prn MiraLax, Lactulose, daily Metamucil.   Esophageal reflux Stable, continue Protonix daily  Peripheral neuropathy ([facility]) Pain is controlled, continue Gabapentin 300mg  bid.   Parkinson's disease ([place]) Cogwheeling of the right wrist. Pressured speech. Soft-spoken. Change in handwriting. Unstable gait. Continue Sinemet tid increased 05/09/16 per  Neurology   BPH (benign prostatic hyperplasia) No urinary retention.    Edema Mainly in ankles. Continue wt daily, update CBC, CMP, BNP in am  Corn A corn at the plantar aspect of the the left 5th MTJ, hydrocolloid dressing for now, plan to shave.       Family/ staff Communication: continue SNF for care assistance.   Labs/tests ordered:  CBC CMP, BNP

## 2016-05-15 NOTE — Assessment & Plan Note (Signed)
No urinary retention.  

## 2016-05-15 NOTE — Assessment & Plan Note (Signed)
Pain is controlled, continue Gabapentin 300mg  bid.

## 2016-05-15 NOTE — Assessment & Plan Note (Addendum)
A corn at the plantar aspect of the the left 5th MTJ, hydrocolloid dressing for now, plan to shave.

## 2016-05-15 NOTE — Assessment & Plan Note (Addendum)
Mainly in ankles. Continue wt daily, update CBC, CMP, BNP in am

## 2016-05-15 NOTE — Assessment & Plan Note (Signed)
Cogwheeling of the right wrist. Pressured speech. Soft-spoken. Change in handwriting. Unstable gait. Continue Sinemet tid increased 05/09/16 per Neurology

## 2016-05-15 NOTE — Assessment & Plan Note (Signed)
Stable, continue prn MiraLax, Lactulose, daily Metamucil.

## 2016-05-20 DIAGNOSIS — D649 Anemia, unspecified: Secondary | ICD-10-CM | POA: Diagnosis not present

## 2016-05-20 DIAGNOSIS — M1990 Unspecified osteoarthritis, unspecified site: Secondary | ICD-10-CM | POA: Diagnosis not present

## 2016-05-20 DIAGNOSIS — E538 Deficiency of other specified B group vitamins: Secondary | ICD-10-CM | POA: Diagnosis not present

## 2016-05-20 DIAGNOSIS — I10 Essential (primary) hypertension: Secondary | ICD-10-CM | POA: Diagnosis not present

## 2016-05-20 DIAGNOSIS — R634 Abnormal weight loss: Secondary | ICD-10-CM | POA: Diagnosis not present

## 2016-05-20 DIAGNOSIS — L84 Corns and callosities: Secondary | ICD-10-CM | POA: Diagnosis not present

## 2016-05-20 DIAGNOSIS — E7800 Pure hypercholesterolemia, unspecified: Secondary | ICD-10-CM | POA: Diagnosis not present

## 2016-05-20 DIAGNOSIS — M2042 Other hammer toe(s) (acquired), left foot: Secondary | ICD-10-CM | POA: Diagnosis not present

## 2016-05-20 DIAGNOSIS — I951 Orthostatic hypotension: Secondary | ICD-10-CM | POA: Diagnosis not present

## 2016-05-20 DIAGNOSIS — K5900 Constipation, unspecified: Secondary | ICD-10-CM | POA: Diagnosis not present

## 2016-05-20 DIAGNOSIS — D519 Vitamin B12 deficiency anemia, unspecified: Secondary | ICD-10-CM | POA: Diagnosis not present

## 2016-05-20 DIAGNOSIS — R1312 Dysphagia, oropharyngeal phase: Secondary | ICD-10-CM | POA: Diagnosis not present

## 2016-05-20 DIAGNOSIS — N400 Enlarged prostate without lower urinary tract symptoms: Secondary | ICD-10-CM | POA: Diagnosis not present

## 2016-05-20 DIAGNOSIS — M6281 Muscle weakness (generalized): Secondary | ICD-10-CM | POA: Diagnosis not present

## 2016-05-20 DIAGNOSIS — R2681 Unsteadiness on feet: Secondary | ICD-10-CM | POA: Diagnosis not present

## 2016-05-20 DIAGNOSIS — E2749 Other adrenocortical insufficiency: Secondary | ICD-10-CM | POA: Diagnosis not present

## 2016-05-20 DIAGNOSIS — R479 Unspecified speech disturbances: Secondary | ICD-10-CM | POA: Diagnosis not present

## 2016-05-20 DIAGNOSIS — R269 Unspecified abnormalities of gait and mobility: Secondary | ICD-10-CM | POA: Diagnosis not present

## 2016-05-20 DIAGNOSIS — K219 Gastro-esophageal reflux disease without esophagitis: Secondary | ICD-10-CM | POA: Diagnosis not present

## 2016-05-20 DIAGNOSIS — R569 Unspecified convulsions: Secondary | ICD-10-CM | POA: Diagnosis not present

## 2016-05-20 DIAGNOSIS — G20 Parkinson's disease: Secondary | ICD-10-CM | POA: Diagnosis not present

## 2016-05-20 DIAGNOSIS — R262 Difficulty in walking, not elsewhere classified: Secondary | ICD-10-CM | POA: Diagnosis not present

## 2016-05-20 DIAGNOSIS — D126 Benign neoplasm of colon, unspecified: Secondary | ICD-10-CM | POA: Diagnosis not present

## 2016-05-20 DIAGNOSIS — H47013 Ischemic optic neuropathy, bilateral: Secondary | ICD-10-CM | POA: Diagnosis not present

## 2016-05-20 DIAGNOSIS — L97509 Non-pressure chronic ulcer of other part of unspecified foot with unspecified severity: Secondary | ICD-10-CM | POA: Diagnosis not present

## 2016-05-20 DIAGNOSIS — R55 Syncope and collapse: Secondary | ICD-10-CM | POA: Diagnosis not present

## 2016-05-22 ENCOUNTER — Other Ambulatory Visit: Payer: Self-pay | Admitting: *Deleted

## 2016-05-22 ENCOUNTER — Encounter: Payer: Self-pay | Admitting: Nurse Practitioner

## 2016-05-22 ENCOUNTER — Encounter: Payer: Self-pay | Admitting: Internal Medicine

## 2016-05-22 DIAGNOSIS — I509 Heart failure, unspecified: Secondary | ICD-10-CM | POA: Insufficient documentation

## 2016-05-24 ENCOUNTER — Ambulatory Visit (INDEPENDENT_AMBULATORY_CARE_PROVIDER_SITE_OTHER): Payer: Medicare Other | Admitting: Endocrinology

## 2016-05-24 ENCOUNTER — Ambulatory Visit: Payer: Medicare Other | Admitting: Neurology

## 2016-05-24 ENCOUNTER — Encounter: Payer: Self-pay | Admitting: Internal Medicine

## 2016-05-24 ENCOUNTER — Encounter: Payer: Self-pay | Admitting: Endocrinology

## 2016-05-24 ENCOUNTER — Non-Acute Institutional Stay (SKILLED_NURSING_FACILITY): Payer: Medicare Other | Admitting: Internal Medicine

## 2016-05-24 VITALS — BP 110/58 | HR 90 | Temp 98.1°F | Resp 14 | Ht 70.0 in | Wt 151.4 lb

## 2016-05-24 DIAGNOSIS — R55 Syncope and collapse: Secondary | ICD-10-CM

## 2016-05-24 DIAGNOSIS — E2749 Other adrenocortical insufficiency: Secondary | ICD-10-CM

## 2016-05-24 DIAGNOSIS — D649 Anemia, unspecified: Secondary | ICD-10-CM

## 2016-05-24 DIAGNOSIS — R269 Unspecified abnormalities of gait and mobility: Secondary | ICD-10-CM | POA: Diagnosis not present

## 2016-05-24 DIAGNOSIS — I951 Orthostatic hypotension: Secondary | ICD-10-CM

## 2016-05-24 DIAGNOSIS — I10 Essential (primary) hypertension: Secondary | ICD-10-CM | POA: Diagnosis not present

## 2016-05-24 DIAGNOSIS — G20 Parkinson's disease: Secondary | ICD-10-CM | POA: Diagnosis not present

## 2016-05-24 LAB — COMPREHENSIVE METABOLIC PANEL
ALT: 10 U/L (ref 0–53)
AST: 29 U/L (ref 0–37)
Albumin: 3 g/dL — ABNORMAL LOW (ref 3.5–5.2)
Alkaline Phosphatase: 39 U/L (ref 39–117)
BILIRUBIN TOTAL: 0.5 mg/dL (ref 0.2–1.2)
BUN: 27 mg/dL — ABNORMAL HIGH (ref 6–23)
CALCIUM: 8 mg/dL — AB (ref 8.4–10.5)
CHLORIDE: 106 meq/L (ref 96–112)
CO2: 25 meq/L (ref 19–32)
Creatinine, Ser: 0.95 mg/dL (ref 0.40–1.50)
GFR: 79.4 mL/min (ref >60.00)
Glucose, Bld: 141 mg/dL — ABNORMAL HIGH (ref 70–99)
Potassium: 3.6 mEq/L (ref 3.5–5.1)
Sodium: 139 mEq/L (ref 135–145)
Total Protein: 5.6 g/dL — ABNORMAL LOW (ref 6.0–8.3)

## 2016-05-24 NOTE — Progress Notes (Addendum)
Patient ID: Lawrence Turner, male   DOB: 1925/11/09, 82 y.o.   MRN: [id number]              Chief complaint: Adrenal insufficiency   History of Present Illness:   The patient was diagnosed to have adrenal insufficiency when he was admitted for syncopal episodes on 04/18/16 Because of his orthostatic low blood pressures he was evaluated in the hospital Apparently also for about a year he had been losing weight He also had decreased appetite and generalized weakness  He had not been on any steroids previously  Patient was evaluated with initial cortisol level of 1.2 Subsequently Cortrosyn stimulation showed baseline of 1.0 and peak level of 5.8 ACTH  level was 1.3  Because of his continued orthostatic hypotension he was started on Florinef in addition to his hydrocortisone in the hospital However with 0.1 mg Florinef his blood pressure was fairly high lying down and this was reduced to half a tablet  Blood pressure readings from the nursing home in the last week have been ranging from 0000000 up to 0000000 systolic and [id number] diastolic Not clear which readings are standing readings, lowest reading is 109/58 but usually not below [id number] systolic  Currently has had much improved appetite and has no lightheadedness, syncopal episodes or nausea He has had a little swelling of his ankle recently and now is taking Aldactone 25 mg daily since 05/22/16    Past Medical History:  Diagnosis Date  . Abnormality of gait 10/19/2015  . Anemia, unspecified   . Benign neoplasm of colon    pre-cancerous polyps removed at colonoscopy 2008  . Contact dermatitis and other eczema, due to unspecified cause   . Diverticulosis of colon (without mention of hemorrhage)   . Esophageal reflux   . Hypertrophy of prostate without urinary obstruction and other lower urinary tract symptoms (LUTS)   . Mononeuritis of unspecified site   . Orthostatic hypotension 04/18/2016  . Osteoarthrosis, unspecified whether generalized or  localized, pelvic region and thigh   . Other B-complex deficiencies   . Other pulmonary embolism and infarction    following cholecystectomy 2003  . Parkinson disease ([place]) 10/19/2015  . Peripheral neuropathy ([place])   . Pure hypercholesterolemia   . Rosacea   . Seizures ([place]) 04/17/2016  . 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 Syncope and collapse 04/18/2016  . Ulcer of foot ([place]) 04/03/2016  . Unspecified constipation   . Unspecified essential hypertension   . Unspecified gastritis and gastroduodenitis without mention of hemorrhage 08/29/2006  . Unspecified hemorrhoids without mention of complication   . Unspecified vitamin D deficiency     Past Surgical History:  Procedure Laterality Date  . CATARACT EXTRACTION W/ INTRAOCULAR LENS IMPLANT Left 5/20//2013  . CHOLECYSTECTOMY, LAPAROSCOPIC  2003  . colonic polyp removal  1965  . COLONOSCOPY  08/29/2006   hemorrhoids, 3 small polyps (hyperplastic) & diverticulosis  . HEMORRHOID SURGERY  1953    Family History  Problem Relation Age of Onset  . Cancer Mother     breast  . Pneumonia Father   . Cancer Brother     Social History:  reports that he quit smoking about 53 years ago. His smoking use included Cigarettes. He quit after 15.00 years of use. He has never used smokeless tobacco. He reports that he drinks about 1.2 oz of alcohol per week . He reports that he does not use drugs.  Allergies:  Allergies  Allergen Reactions  . Lyrica [  Pregabalin]     *Anticonvulsants*, fatique      Medication List       Accurate as of 05/24/16  3:29 PM. Always use your most recent med list.          acetaminophen 325 MG tablet Commonly known as:  TYLENOL Take 650 mg by mouth 2 (two) times daily.   BOOST PLUS PO Take 1 Can by mouth 3 (three) times daily. One can three times a day with meals   carbidopa-levodopa 25-100 MG tablet Commonly known as:  SINEMET IR Take 1 tablet by mouth 3 (three) times daily.     cyclobenzaprine 5 MG tablet Commonly known as:  FLEXERIL Take 1 tablet (5 mg total) by mouth 3 (three) times daily as needed for muscle spasms (muscle pain and stiffness).   feeding supplement (PRO-STAT SUGAR FREE 64) Liqd Take 30 mLs by mouth 2 (two) times daily. 30 ml twice daily to promote wound healing   fludrocortisone 0.1 MG tablet Commonly known as:  FLORINEF Take 0.5 tablets (0.05 mg total) by mouth daily.   hydrocortisone 10 MG tablet Commonly known as:  CORTEF Take 1 tablet (10 mg total) by mouth daily at 6 PM. Every evening   hydrocortisone 20 MG tablet Commonly known as:  CORTEF Take 1 tablet (20 mg total) by mouth every morning. Every morning   ibuprofen 400 MG tablet Commonly known as:  ADVIL,MOTRIN Take 1 tablet (400 mg total) by mouth every 6 (six) hours as needed for moderate pain.   lactulose 10 GM/15ML solution Commonly known as:  CHRONULAC Take 40 g by mouth daily as needed for mild constipation. Take 60 ml as needed for constipation   megestrol 40 MG/ML suspension Commonly known as:  MEGACE Take 47ml daily to help stimulate appetite   mirtazapine 15 MG tablet Commonly known as:  REMERON Take 15 mg by mouth at bedtime.   multivitamin tablet Take 1 tablet by mouth daily.   polyethylene glycol packet Commonly known as:  MIRALAX / GLYCOLAX Take 17 g by mouth daily as needed for mild constipation.   psyllium 58.6 % powder Commonly known as:  METAMUCIL Take 1 packet by mouth daily. One tablespoon in 4 oz liquid daily   ranitidine 150 MG tablet Commonly known as:  ZANTAC Take 150 mg by mouth at bedtime.   spironolactone 25 MG tablet Commonly known as:  ALDACTONE Take 25 mg by mouth. Take one tablet daily   vitamin B-12 1000 MCG tablet Commonly known as:  CYANOCOBALAMIN Take 1,000 mcg by mouth daily.   zinc oxide 20 % ointment Apply 1 application topically 2 (two) times daily as needed for irritation.       LABS:  Lab on 05/22/2016   Component Date Value Ref Range Status  . Hemoglobin 05/15/2016 93.0* 13.5 - 17.5 g/dL Final  . HCT 05/15/2016 28* 41 - 53 % Final  . Platelets 05/15/2016 167  150 - 399 K/L Final  . WBC 05/15/2016 6.4  10^3/mL Final  . Glucose 05/15/2016 80  mg/dL Final  . BUN 05/15/2016 20  4 - 21 mg/dL Final  . Creatinine 05/15/2016 0.9  0.6 - 1.3 mg/dL Final  . Potassium 05/15/2016 3.3* 3.4 - 5.3 mmol/L Final  . Sodium 05/15/2016 141  137 - 147 mmol/L Final  . Alkaline Phosphatase 05/15/2016 27  25 - 125 U/L Final  . ALT 05/15/2016 29  10 - 40 U/L Final  . AST 05/15/2016 22  14 - 40 U/L Final  .  Bilirubin, Total 05/15/2016 0.5  mg/dL Final     REVIEW OF SYSTEMS:        Review of Systems  Constitutional: Positive for weight loss.  HENT: Negative for headaches.   Eyes: Negative for blurred vision.  Respiratory: Negative for shortness of breath.   Cardiovascular: Positive for leg swelling.  Gastrointestinal: Positive for constipation.  Endocrine: Positive for general weakness. Negative for fatigue and light-headedness.  Musculoskeletal: Negative for joint pain and back pain.  Neurological: Positive for weakness and balance difficulty.       He has difficulty with Parkinson's disease, only mild tremor present     PHYSICAL EXAM:  BP (!) 110/58   Pulse 90   Temp 98.1 F (36.7 C)   Resp 14   Ht 5\' 10"  (1.778 m)   Wt 151 lb 6.4 oz (68.7 kg)   SpO2 98%   BMI 21.72 kg/m   GENERAL:Soft-spoken, somewhat asthenic-looking  No pallor, clubbing, lymphadenopathy.  Skin:  no rash or pigmentation. 1+ ankle edema present, no leg edema  EYES:  Externally normal.  Fundii:  normal discs and vessels.  ENT: Oral mucosa and tongue normal.  THYROID:  Not palpable.  HEART:  Normal  S1 and S2; no murmur or click.  CHEST:  Hyperinflation present.  Also has pectus excavatus   Lungs: Vescicular breath sounds heard equally.  No crepitations/ wheeze.  ABDOMEN:  No distention.  Liver and spleen not  palpable.  No other mass or tenderness.  NEUROLOGICAL: .Reflexes are bilaterally at normal at biceps Has no rigidity.  JOINTS:  Normal.   ASSESSMENT:    Secondary adrenal insufficiency.  This appears to be adequately replaced with 20 mg in the morning and 10 mg in the evening at 6 PM  Orthostatic hypotension probably likely to be a combination of adrenal insufficiency and also autonomic neuropathy associated with Parkinson's disease.  Does not have any excessive drop in blood pressure standing up with his supine and sitting blood pressure readings are mostly high at the nursing home  Mild ankle edema may be related to either low albumin level or starting Florinef  Recent hypokalemia with potassium 3.3, 9 days ago, now on Aldactone   PLAN:    Check chemistry levels today, may need for-supplements  May consider using Bystolic for supine hypertension especially since his pulse is relatively high  Continue Florinef half tablet daily  Review progress in 1 month again  [facility] 05/24/2016, 3:29 PM

## 2016-05-24 NOTE — Progress Notes (Signed)
Progress Note    Location:  [facility] Room Number: N36 Place of Service:  SNF [phone] Provider:   Jeanmarie Hubert, MD  Patient Care Team: Estill Dooms, MD as PCP - General (Internal Medicine) Man Otho Darner, NP as Nurse Practitioner (Nurse Practitioner) Kathrynn Ducking, MD as Consulting Physician (Neurology)  Extended Emergency Contact Information Primary Emergency Contact: Lucita Ferrara Address: [address]           [place], [place] [address] [name] of [place] Phone: [phone] Relation: Spouse Secondary Emergency Contact: Kendra,Clifford  [place] Phone: [phone] Relation: Son  Code Status:  DNR Goals of care: Advanced Directive information Advanced Directives 05/24/2016  Does patient have an advance directive? Yes  Type of Paramedic of [place];Living will;Out of facility DNR (pink MOST or yellow form)  Does patient want to make changes to advanced directive? -  Copy of advanced directive(s) in chart? Yes  Pre-existing out of facility DNR order (yellow form or pink MOST form) Yellow form placed in chart (order not valid for inpatient use)     Chief Complaint  Patient presents with  . Medical Management of Chronic Issues    routine    HPI:  Pt is a 82 y.o. male seen today for medical management of chronic diseases.    Parkinson's disease ([place]) - seems too be doing about the same on a lower dose of Sinemet. Gait impairment related to Parkinsonism. Also has rapid speech. Very little rigidity or tremor evident today.  Abnormality of gait - high risk fir falls. Festinating gait. Easily loses balance  Orthostatic hypotension - wide variations in Bp continue. Bp when upright is lower as compared to recumbent BP.  Uncontrolled hypertension - using clonidine for SBP> 190.  Syncope, unspecified syncope type - no further episodes since return from hospita  Secondary adrenal  insufficiency ([place]) - seeing Dr. Dwyane Dee today  Normocytic anemia - slowly falling Hgb. Continue to follow lab.     Past Medical History:  Diagnosis Date  . Abnormality of gait 10/19/2015  . Anemia, unspecified   . Benign neoplasm of colon    pre-cancerous polyps removed at colonoscopy 2008  . Contact dermatitis and other eczema, due to unspecified cause   . Diverticulosis of colon (without mention of hemorrhage)   . Esophageal reflux   . Hypertrophy of prostate without urinary obstruction and other lower urinary tract symptoms (LUTS)   . Mononeuritis of unspecified site   . Orthostatic hypotension 04/18/2016  . Osteoarthrosis, unspecified whether generalized or localized, pelvic region and thigh   . Other B-complex deficiencies   . Other pulmonary embolism and infarction    following cholecystectomy 2003  . Parkinson disease ([place]) 10/19/2015  . Peripheral neuropathy ([place])   . Pure hypercholesterolemia   . Rosacea   . Seizures ([place]) 04/17/2016  . 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 Syncope and collapse 04/18/2016  . Ulcer of foot ([place]) 04/03/2016  . Unspecified constipation   . Unspecified essential hypertension   . Unspecified gastritis and gastroduodenitis without mention of hemorrhage 08/29/2006  . Unspecified hemorrhoids without mention of complication   . Unspecified vitamin D deficiency    Past Surgical History:  Procedure Laterality Date  . CATARACT EXTRACTION W/ INTRAOCULAR LENS IMPLANT Left 5/20//2013  . CHOLECYSTECTOMY, LAPAROSCOPIC  2003  . colonic polyp removal  1965  . COLONOSCOPY  08/29/2006   hemorrhoids, 3 small polyps (  hyperplastic) & diverticulosis  . HEMORRHOID SURGERY  1953    Allergies  Allergen Reactions  . Lyrica [Pregabalin]     *Anticonvulsants*, fatique      Medication List       Accurate as of 05/24/16 10:04 AM. Always use your most recent med list.          acetaminophen 325 MG tablet Commonly known as:   TYLENOL Take 650 mg by mouth 2 (two) times daily.   BOOST PLUS PO Take 1 Can by mouth 3 (three) times daily. One can three times a day with meals   carbidopa-levodopa 25-100 MG tablet Commonly known as:  SINEMET IR Take 1 tablet by mouth 3 (three) times daily.   cyclobenzaprine 5 MG tablet Commonly known as:  FLEXERIL Take 1 tablet (5 mg total) by mouth 3 (three) times daily as needed for muscle spasms (muscle pain and stiffness).   feeding supplement (PRO-STAT SUGAR FREE 64) Liqd Take 30 mLs by mouth 2 (two) times daily. 30 ml twice daily to promote wound healing   fludrocortisone 0.1 MG tablet Commonly known as:  FLORINEF Take 0.5 tablets (0.05 mg total) by mouth daily.   hydrocortisone 10 MG tablet Commonly known as:  CORTEF Take 1 tablet (10 mg total) by mouth daily at 6 PM. Every evening   hydrocortisone 20 MG tablet Commonly known as:  CORTEF Take 1 tablet (20 mg total) by mouth every morning. Every morning   ibuprofen 400 MG tablet Commonly known as:  ADVIL,MOTRIN Take 1 tablet (400 mg total) by mouth every 6 (six) hours as needed for moderate pain.   lactulose 10 GM/15ML solution Commonly known as:  CHRONULAC Take 40 g by mouth daily as needed for mild constipation. Take 60 ml as needed for constipation   megestrol 40 MG/ML suspension Commonly known as:  MEGACE Take 52ml daily to help stimulate appetite   mirtazapine 15 MG tablet Commonly known as:  REMERON Take 15 mg by mouth at bedtime.   multivitamin tablet Take 1 tablet by mouth daily.   polyethylene glycol packet Commonly known as:  MIRALAX / GLYCOLAX Take 17 g by mouth daily as needed for mild constipation.   psyllium 58.6 % powder Commonly known as:  METAMUCIL Take 1 packet by mouth daily. One tablespoon in 4 oz liquid daily   ranitidine 150 MG tablet Commonly known as:  ZANTAC Take 150 mg by mouth at bedtime.   spironolactone 25 MG tablet Commonly known as:  ALDACTONE Take 25 mg by mouth.  Take one tablet daily   vitamin B-12 1000 MCG tablet Commonly known as:  CYANOCOBALAMIN Take 1,000 mcg by mouth daily.   zinc oxide 20 % ointment Apply 1 application topically 2 (two) times daily as needed for irritation.       Review of Systems  Constitutional: Positive for unexpected weight change (losiing weight). Negative for chills, diaphoresis and fever.  HENT: Negative for congestion, ear pain and hearing loss.   Eyes: Negative for photophobia, pain, discharge and redness.  Respiratory: Negative for cough, shortness of breath and wheezing.   Cardiovascular: Positive for leg swelling. Negative for chest pain and palpitations.       BLE edema  Gastrointestinal: Positive for constipation and diarrhea. Negative for abdominal pain, blood in stool and nausea.  Genitourinary: Negative.   Musculoskeletal: Negative for back pain, myalgias and neck pain.  Skin:       Multiple seborrheic keratoses.  Corn on the plantar aspect of the left  fifth metatarsal area distally since mid-July 2017.  Neurological: Positive for seizures (Altered consciousness with sparing but no generalized tonic-clonic activity.) and weakness. Negative for tremors and headaches.       Complaint of loss of motor skills and changes in. He is unstable with walking. He has a known peripheral neuropathy of uncertain origin. Micrographia. Pressured, soft speech that is nearly inaudible. History of parkinsonism currently treated with Sinemet.  Hematological:       History of anemia  Psychiatric/Behavioral: The patient is nervous/anxious.     Immunization History  Administered Date(s) Administered  . DTaP 09/01/2007  . Influenza-Unspecified 07/04/2014, 05/19/2015  . Pneumococcal Polysaccharide-23 06/11/2002   Pertinent  Health Maintenance Due  Topic Date Due  . PNA vac Low Risk Adult (2 of 2 - PCV13) 06/12/2003  . INFLUENZA VACCINE  03/20/2016   Fall Risk  04/03/2016 02/14/2016 01/24/2016 01/24/2016 10/25/2015  Falls  in the past year? No No Yes No No  Number falls in past yr: - - 2 or more - -  Injury with Fall? - - No - -  Risk Factor Category  - - - - -  Risk for fall due to : - - - - -  Risk for fall due to (comments): - - - - -  Follow up - - - - -   Functional Status Survey:    Vitals:   05/24/16 0955  BP: (!) 189/90  Pulse: 74  Resp: 18  Temp: 98.5 F (36.9 C)  SpO2: 97%  Weight: 142 lb (64.4 kg)  Height: 5\' 8"  (1.727 m)   Body mass index is 21.59 kg/m. Physical Exam  Constitutional: He is oriented to person, place, and time.  Thin. Elderly.  HENT:  Right Ear: External ear normal.  Left Ear: External ear normal.  Nose: Nose normal.  Mouth/Throat: Oropharynx is clear and moist. No oropharyngeal exudate.  Eyes: Conjunctivae are normal. Pupils are equal, round, and reactive to light.  Neck: No JVD present. No tracheal deviation present. No thyromegaly present.  Cardiovascular: Normal rate, regular rhythm, normal heart sounds and intact distal pulses.  Exam reveals no gallop and no friction rub.   No murmur heard. Pulmonary/Chest: No respiratory distress. He has no wheezes. He has no rales. He exhibits no tenderness.  Abdominal: He exhibits no distension and no mass. There is no tenderness.  Musculoskeletal: Normal range of motion. He exhibits edema (1+ ipedal) and tenderness.  uunstable gait. Trace edema BLE. Pain with movement of the left hip and thigh  Lymphadenopathy:    He has no cervical adenopathy.  Neurological: He is alert and oriented to person, place, and time. He displays abnormal reflex (Absent at both knees). No cranial nerve deficit. Coordination normal.  Cogwheeling most evident in the right wrist. Gait is mildly abnormal with a suggestion of a festinating gait. Speech is soft and mildly pressured. There is a mild tremor at rest. There is no focal weakness. 1/14//15 MMSE 30/30. Passed clock drawing test.  Skin: No rash noted. No erythema. No pallor.  A corn at the  plantar aspect of the the left 5th MTJ  Psychiatric:  Patient appears mildly anxious.     Labs reviewed:  Recent Labs  04/19/16 0434  04/24/16 0654 04/26/16 0400 05/08/16 1653 05/15/16  NA 141  < > 137 137 141 141  K 4.1  < > 3.9 3.7 3.6 3.3*  CL 111  < > 110 110 106  --   CO2 22  < >  21* 21* 22  --   GLUCOSE 96  < > 90 105* 153*  --   BUN 26*  < > 29* 27* 33* 20  CREATININE 1.16  < > 0.96 0.96 1.03 0.9  CALCIUM 9.0  < > 8.8* 8.6* 8.2*  --   MG 2.1  --   --   --   --   --   < > = values in this interval not displayed.  Recent Labs  04/18/16 2232 04/19/16 0434 05/08/16 1653 05/15/16  AST 24 23 27 22   ALT 9* 19 30 29   ALKPHOS 44 43 39 27  BILITOT 0.3 0.4 0.3  --   PROT 6.1* 5.9* 5.2*  --   ALBUMIN 3.3* 3.3* 3.2*  --     Recent Labs  04/22/16 0523 04/23/16 0357 04/24/16 0654 04/26/16 0400 05/15/16  WBC 9.4 9.3 10.0 9.1 6.4  NEUTROABS 7.5 7.2 8.0*  --   --   HGB 10.2* 10.4* 10.7* 9.8* 93.0*  HCT 31.5* 31.0* 32.6* 30.2* 28*  MCV 95.2 94.2 94.8 95.3  --   PLT 161 164 149* 156 167   Lab Results  Component Value Date   TSH 2.297 04/24/2016   No results found for: HGBA1C Lab Results  Component Value Date   CHOL 166 05/24/2014   HDL 45 05/24/2014   LDLCALC 110 05/24/2014   TRIG 54 05/24/2014    Significant Diagnostic Results in last 30 days:  No results found.  Assessment/Plan 1. Parkinson's disease ([place]) Continue current dose Sinemet  2. Abnormality of gait Continue PT  3. Orthostatic hypotension No change meds. Continue Florinef. Wear TED hose  4. Uncontrolled hypertension Continue current meds  5. Syncope, unspecified syncope type stable  6. Secondary adrenal insufficiency ([place]) See RDr. Dwyane Dee. Continue current meds.  7. Normocytic anemia Monitor CBC    Over 30 minutes were spent with patient and son regarding review of current care and hospital findings.

## 2016-05-25 ENCOUNTER — Other Ambulatory Visit: Payer: Self-pay | Admitting: Internal Medicine

## 2016-05-25 NOTE — Progress Notes (Signed)
Please let his son known that potassium is okay and protein level is still low

## 2016-05-28 LAB — BASIC METABOLIC PANEL
BUN: 18 mg/dL (ref 4–21)
Creatinine: 1 mg/dL (ref <=1.3)
GLUCOSE: 76 mg/dL
Sodium: 142 mmol/L (ref 137–147)

## 2016-05-29 ENCOUNTER — Other Ambulatory Visit: Payer: Self-pay | Admitting: *Deleted

## 2016-06-04 LAB — BASIC METABOLIC PANEL
BUN: 17 mg/dL (ref 4–21)
CREATININE: 1 mg/dL (ref <=1.3)
GLUCOSE: 73 mg/dL
Potassium: 3.6 mmol/L (ref 3.4–5.3)
SODIUM: 142 mmol/L (ref 137–147)

## 2016-06-05 ENCOUNTER — Other Ambulatory Visit: Payer: Self-pay | Admitting: *Deleted

## 2016-06-12 ENCOUNTER — Telehealth: Payer: Self-pay | Admitting: Neurology

## 2016-06-12 NOTE — Telephone Encounter (Signed)
Son [name] [phone] called to leave phone numbers where Rx for Sinemet can be left, [facility] [phone], [facility] [phone], not sure which of these units is handling Rx's at this time.

## 2016-06-12 NOTE — Telephone Encounter (Signed)
The patient returned your call. °

## 2016-06-12 NOTE — Telephone Encounter (Signed)
I called the son, I called the patient, and I called [facility] [place]. The patient apparently has had much better blood pressures recently, he likely could tolerate an increase in his Sinemet dose. The patient has had some problems with walking, he claims that he is feeling weak in the thighs. We will go up on the Sinemet taking 1.5 tablets 3 times daily.  I have called [facility] [place] concerning this.

## 2016-06-12 NOTE — Telephone Encounter (Signed)
Patient's son is calling to have Dr. Jannifer Franklin call the patient to discuss.the dosage and possible increase for medication carbidopa-levodopa (SINEMET IR) 25-100 MG tablet. For the past 2 weeks the patient has had trouble with movement. The patient can be reached at [phone].

## 2016-06-12 NOTE — Telephone Encounter (Signed)
I called and left a message. I'll try to call back later.

## 2016-06-21 ENCOUNTER — Non-Acute Institutional Stay: Payer: Medicare Other | Admitting: Internal Medicine

## 2016-06-21 ENCOUNTER — Encounter: Payer: Self-pay | Admitting: Internal Medicine

## 2016-06-21 ENCOUNTER — Ambulatory Visit: Payer: Medicare Other | Admitting: Endocrinology

## 2016-06-21 DIAGNOSIS — M6281 Muscle weakness (generalized): Secondary | ICD-10-CM | POA: Diagnosis not present

## 2016-06-21 DIAGNOSIS — R55 Syncope and collapse: Secondary | ICD-10-CM

## 2016-06-21 DIAGNOSIS — L97501 Non-pressure chronic ulcer of other part of unspecified foot limited to breakdown of skin: Secondary | ICD-10-CM

## 2016-06-21 DIAGNOSIS — E2749 Other adrenocortical insufficiency: Secondary | ICD-10-CM

## 2016-06-21 DIAGNOSIS — G20 Parkinson's disease: Secondary | ICD-10-CM | POA: Diagnosis not present

## 2016-06-21 DIAGNOSIS — L89152 Pressure ulcer of sacral region, stage 2: Secondary | ICD-10-CM

## 2016-06-21 DIAGNOSIS — I951 Orthostatic hypotension: Secondary | ICD-10-CM | POA: Diagnosis not present

## 2016-06-21 DIAGNOSIS — R2681 Unsteadiness on feet: Secondary | ICD-10-CM | POA: Diagnosis not present

## 2016-06-21 DIAGNOSIS — I10 Essential (primary) hypertension: Secondary | ICD-10-CM

## 2016-06-21 DIAGNOSIS — R262 Difficulty in walking, not elsewhere classified: Secondary | ICD-10-CM | POA: Diagnosis not present

## 2016-06-21 DIAGNOSIS — R634 Abnormal weight loss: Secondary | ICD-10-CM

## 2016-06-21 NOTE — Progress Notes (Signed)
History and Physical       Location:  [facility] Room Number: [phone] Place of Service:  ALF [phone])  PCP: Jeanmarie Hubert, MD Patient Care Team: Estill Dooms, MD as PCP - General (Internal Medicine) Man Otho Darner, NP as Nurse Practitioner (Nurse Practitioner) Kathrynn Ducking, MD as Consulting Physician (Neurology)  Extended Emergency Contact Information Primary Emergency Contact: Lucita Ferrara Address: [address]           [place], [place] [address] [name] of [place] Phone: [phone] Relation: Spouse Secondary Emergency Contact: Picou,Clifford  [place] Phone: [phone] Relation: Son  Code Status: DNR Goals of Care: Advanced Directive information Advanced Directives 06/21/2016  Does patient have an advance directive? Yes  Type of Paramedic of [place];Living will  Does patient want to make changes to advanced directive? -  Copy of advanced directive(s) in chart? Yes  Pre-existing out of facility DNR order (yellow form or pink MOST form) -      Chief Complaint  Patient presents with  . Readmit To AL    transfered from skill unit back to AL    HPI: Patient is a 82 y.o. male seen today for admission to [facility] on 06/20/16 by transfer from [facility] SNF.  He was hospitalized from 04/18/16 to 04/30/16. While in SNF from 04/30/16 to 06/20/16, he gained weight, strength, and safe mobility with walker.  Hospitalization was for syncopal episodes that were thought related to postural drops in BP . He has Parkinson's Disease and is subject to significant deviations in BP.  He has not had any further syncope. We have allowed his BP to run high at times. He is on Florinef to support BP.  He has a chronic anemia. Last Hgb was 9.3. We will continue to follow with lab periodically.   Patient is alert and cooperative. The ulcer on his left heel has completely closed.  He continues to have some edema at abou  1-2+ bilaterally.  There is stage 2 sacral decubitus that is mildly inflamed.  Past Medical History:  Diagnosis Date  . Abnormality of gait 10/19/2015  . Anemia, unspecified   . Benign neoplasm of colon    pre-cancerous polyps removed at colonoscopy 2008  . Contact dermatitis and other eczema, due to unspecified cause   . Diverticulosis of colon (without mention of hemorrhage)   . Esophageal reflux   . Hypertrophy of prostate without urinary obstruction and other lower urinary tract symptoms (LUTS)   . Mononeuritis of unspecified site   . Orthostatic hypotension 04/18/2016  . Osteoarthrosis, unspecified whether generalized or localized, pelvic region and thigh   . Other B-complex deficiencies   . Other pulmonary embolism and infarction    following cholecystectomy 2003  . Parkinson disease ([place]) 10/19/2015  . Peripheral neuropathy ([place])   . Pure hypercholesterolemia   . Rosacea   . Seizures ([place]) 04/17/2016  . 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 Syncope and collapse 04/18/2016  . Ulcer of foot ([place]) 04/03/2016  . Unspecified constipation   . Unspecified essential hypertension   . Unspecified gastritis and gastroduodenitis without mention of hemorrhage 08/29/2006  . Unspecified hemorrhoids without mention of complication   . Unspecified vitamin D deficiency    Past Surgical History:  Procedure Laterality Date  . CATARACT EXTRACTION W/ INTRAOCULAR LENS IMPLANT Left 5/20//2013  . CHOLECYSTECTOMY, LAPAROSCOPIC  2003  . colonic polyp removal  1965  .  COLONOSCOPY  08/29/2006   hemorrhoids, 3 small polyps (hyperplastic) & diverticulosis  . [place]    reports that he quit smoking about 53 years ago. His smoking use included Cigarettes. He quit after 15.00 years of use. He has never used smokeless tobacco. He reports that he drinks about 1.2 oz of alcohol per week . He reports that he does not use drugs. Social History   Social History  .  Marital status: Married    Spouse name: [name]  . Number of children: 1  . Years of education: 90   Occupational History  . retired Advertising account executive    Social History Main Topics  . Smoking status: Former Smoker    Years: 15.00    Types: Cigarettes    Quit date: 01/30/1963  . Smokeless tobacco: Never Used  . Alcohol use 1.2 oz/week    1 Glasses of wine, 1 Cans of beer per week     Comment: 14 per week  . Drug use: No  . Sexual activity: Not on file   Other Topics Concern  . Not on file   Social History Narrative   L[facility] at [facility] since 01/[facility]/2014, 10/19/15 currently in assisted living area   Married [place]   Former smoker stopped 1964   Alcohlol 14 drinks per week   Exercise walks daily   Living Will, [place]             Functional Status Survey:    Family History  Problem Relation Age of Onset  . Cancer Mother     breast  . Pneumonia Father   . Cancer Brother     Health Maintenance  Topic Date Due  . Samul Dada  09/26/1946  . ZOSTAVAX  09/27/1987  . PNA vac Low Risk Adult (2 of 2 - PCV13) 06/12/2003  . INFLUENZA VACCINE  Completed    Allergies  Allergen Reactions  . Lyrica [Pregabalin]     *Anticonvulsants*, fatique      Medication List       Accurate as of 06/21/16  3:25 PM. Always use your most recent med list.          acetaminophen 325 MG tablet Commonly known as:  TYLENOL Take 325 mg by mouth. Take 2 tablets daily; take 2 every 4 hours as needed for pain   BOOST PLUS PO Take 1 Can by mouth 3 (three) times daily. One can three times a day with meals   carbidopa-levodopa 25-100 MG tablet Commonly known as:  SINEMET IR Take 1 tablet by mouth. Take 1 1/2 tablets three times daily   cyclobenzaprine 5 MG tablet Commonly known as:  FLEXERIL Take 1 tablet (5 mg total) by mouth 3 (three) times daily as needed for muscle spasms (muscle pain and stiffness).   famotidine 20 MG tablet Commonly known as:  PEPCID Take 20 mg by mouth. Take one tablet  at bedtime   feeding supplement (PRO-STAT SUGAR FREE 64) Liqd Take 30 mLs by mouth 2 (two) times daily. 30 ml twice daily to promote wound healing   FLUDROCORTISONE ACETATE PO Take by mouth. Take 0.05 mg one daily. Hold if BP > 160/90   hydrocortisone 10 MG tablet Commonly known as:  CORTEF Take by mouth. Take 2 tablets once daily; take one every evening   ibuprofen 400 MG tablet Commonly known as:  ADVIL,MOTRIN Take 1 tablet (400 mg total) by mouth every 6 (six) hours as needed for moderate pain.   lactulose 10  GM/15ML solution Commonly known as:  CHRONULAC Take 40 g by mouth daily as needed for mild constipation. Take 60 ml as needed for constipation   mirtazapine 15 MG tablet Commonly known as:  REMERON Take 15 mg by mouth at bedtime.   polyethylene glycol packet Commonly known as:  MIRALAX / GLYCOLAX Take 17 g by mouth daily as needed for mild constipation.   psyllium 58.6 % powder Commonly known as:  METAMUCIL Take 1 packet by mouth daily. One tablespoon in 4 oz liquid daily   spironolactone 25 MG tablet Commonly known as:  ALDACTONE Take 25 mg by mouth. Take one tablet daily   THERA-M Tabs TAKE ONE TABLET BY MOUTH EVERY DAY (FORMULARY SUB. FOR DAILY VITE)   Vitamin B12-Folic Acid XX123456 MCG Tabs Take by mouth. Take 2 tablets daily   zinc oxide 20 % ointment Apply 1 application topically 2 (two) times daily as needed for irritation.       Review of Systems  Constitutional: Negative for chills, diaphoresis and fever.  HENT: Negative for congestion, ear pain and hearing loss.   Eyes: Negative for photophobia, pain, discharge and redness.  Respiratory: Negative for cough, shortness of breath and wheezing.   Cardiovascular: Positive for leg swelling. Negative for chest pain and palpitations.       BLE edema Postural drops in BP. Very high BP at times.  Gastrointestinal: Positive for constipation. Negative for abdominal pain, blood in stool, diarrhea and  nausea.  Genitourinary: Negative.   Musculoskeletal: Positive for arthralgias. Negative for back pain, myalgias and neck pain.  Skin:       Multiple seborrheic keratoses.   Neurological: Positive for weakness. Negative for tremors, seizures and headaches.       Complaint of loss of motor skills and changes in. He is unstable with walking. He has a known peripheral neuropathy of uncertain origin. Micrographia. Pressured, soft speech that is nearly inaudible. History of parkinsonism currently treated with Sinemet.  Hematological:       History of anemia  Psychiatric/Behavioral: The patient is nervous/anxious.     Vitals:   06/21/16 1425  BP: (!) 144/84  Pulse: 91  Resp: 17  Temp: 98.4 F (36.9 C)  Weight: 145 lb (65.8 kg)  Height: 5\' 10"  (1.778 m)   Body mass index is 20.81 kg/m. Physical Exam  Constitutional: He is oriented to person, place, and time.  Thin. Elderly.  HENT:  Right Ear: External ear normal.  Left Ear: External ear normal.  Nose: Nose normal.  Mouth/Throat: Oropharynx is clear and moist. No oropharyngeal exudate.  Eyes: Conjunctivae are normal. Pupils are equal, round, and reactive to light.  Neck: No JVD present. No tracheal deviation present. No thyromegaly present.  Cardiovascular: Normal rate, regular rhythm, normal heart sounds and intact distal pulses.  Exam reveals no gallop and no friction rub.   No murmur heard. Pulmonary/Chest: No respiratory distress. He has no wheezes. He has no rales. He exhibits no tenderness.  Abdominal: He exhibits no distension and no mass. There is no tenderness.  Musculoskeletal: Normal range of motion. He exhibits edema (1+ ipedal) and tenderness.  unstable gait. Trace edema BLE. Pain with movement of the left hip and thigh  Lymphadenopathy:    He has no cervical adenopathy.  Neurological: He is alert and oriented to person, place, and time. He displays abnormal reflex (Absent at both knees). No cranial nerve deficit.  Coordination normal.  Cogwheeling most evident in the right wrist. Gait is mildly abnormal  with a suggestion of a festinating gait. Speech is soft and mildly pressured. There is a mild tremor at rest. There is no focal weakness. 1/14//15 MMSE 30/30. Passed clock drawing test.  Skin: No rash noted. No erythema. No pallor.  Healed corn at the plantar aspect of the the left 5th MTJ  Psychiatric:  Patient appears mildly anxious.     Labs reviewed: Basic Metabolic Panel:  Recent Labs  04/19/16 0434  04/26/16 0400 05/08/16 1653 05/15/16 05/24/16 1500 05/28/16 06/04/16  NA 141  < > 137 141 141 139 142 142  K 4.1  < > 3.7 3.6 3.3* 3.6  --  3.6  CL 111  < > 110 106  --  106  --   --   CO2 22  < > 21* 22  --  25  --   --   GLUCOSE 96  < > 105* 153*  --  141*  --   --   BUN 26*  < > 27* 33* 20 27* 18 17  CREATININE 1.16  < > 0.96 1.03 0.9 0.95 1.0 1.0  CALCIUM 9.0  < > 8.6* 8.2*  --  8.0*  --   --   MG 2.1  --   --   --   --   --   --   --   < > = values in this interval not displayed. Liver Function Tests:  Recent Labs  04/19/16 0434 05/08/16 1653 05/15/16 05/24/16 1500  AST 23 27 22 29   ALT 19 30 29 10   ALKPHOS 43 39 27 39  BILITOT 0.4 0.3  --  0.5  PROT 5.9* 5.2*  --  5.6*  ALBUMIN 3.3* 3.2*  --  3.0*   No results for input(s): LIPASE, AMYLASE in the last 8760 hours. No results for input(s): AMMONIA in the last 8760 hours. CBC:  Recent Labs  04/22/16 0523 04/23/16 0357 04/24/16 0654 04/26/16 0400 05/15/16  WBC 9.4 9.3 10.0 9.1 6.4  NEUTROABS 7.5 7.2 8.0*  --   --   HGB 10.2* 10.4* 10.7* 9.8* 93.0*  HCT 31.5* 31.0* 32.6* 30.2* 28*  MCV 95.2 94.2 94.8 95.3  --   PLT 161 164 149* 156 167   Cardiac Enzymes:  Recent Labs  04/19/16 0620 04/19/16 1538  TROPONINI <0.03 <0.03   BNP: Invalid input(s): POCBNP No results found for: HGBA1C Lab Results  Component Value Date   TSH 2.297 04/24/2016   Lab Results  Component Value Date   [id number] 10/19/2015    No results found for: FOLATE No results found for: IRON, TIBC, FERRITIN  Imaging and Procedures obtained prior to SNF admission: Dg Chest 2 View  Result Date: 04/18/2016 CLINICAL DATA:  Syncope.  Weakness and dyspnea tonight. EXAM: CHEST  2 VIEW COMPARISON:  None. FINDINGS: The lungs are clear. The pulmonary vasculature is normal. Heart size is normal. Hilar and mediastinal contours are unremarkable. There is no pleural effusion. IMPRESSION: No active cardiopulmonary disease. Electronically Signed   By: Andreas Newport M.D.   On: 04/18/2016 22:50    Assessment/Plan 1. Orthostatic hypotension Continue to use Florinef. May benefit from compression stockings  2. Uncontrolled hypertension monitor  3. Secondary adrenal insufficiency (HCC) Continue hydrocortisone 10 mg qd  4. Syncope, unspecified syncope type No further episodes  5. Weight loss regaining weight  6. Ulcer of foot, limited to breakdown of skin, unspecified laterality ([place]) resolved  7. Decubitus ulcer of sacral region, stage 2 A&D ointment bid  I anticipate this admission is for long term care.

## 2016-06-25 DIAGNOSIS — D509 Iron deficiency anemia, unspecified: Secondary | ICD-10-CM | POA: Diagnosis not present

## 2016-06-25 LAB — CBC AND DIFFERENTIAL
HCT: 30 % — AB (ref 41–53)
Hemoglobin: 10.2 g/dL — AB (ref 13.5–17.5)
Platelets: 179 10*3/uL (ref 150–399)
WBC: 9.1 10^3/mL

## 2016-06-26 ENCOUNTER — Encounter: Payer: Self-pay | Admitting: Nurse Practitioner

## 2016-06-26 DIAGNOSIS — R2681 Unsteadiness on feet: Secondary | ICD-10-CM | POA: Diagnosis not present

## 2016-06-26 DIAGNOSIS — M6281 Muscle weakness (generalized): Secondary | ICD-10-CM | POA: Diagnosis not present

## 2016-06-26 DIAGNOSIS — R262 Difficulty in walking, not elsewhere classified: Secondary | ICD-10-CM | POA: Diagnosis not present

## 2016-06-26 DIAGNOSIS — G20 Parkinson's disease: Secondary | ICD-10-CM | POA: Diagnosis not present

## 2016-06-28 DIAGNOSIS — R2681 Unsteadiness on feet: Secondary | ICD-10-CM | POA: Diagnosis not present

## 2016-06-28 DIAGNOSIS — G20 Parkinson's disease: Secondary | ICD-10-CM | POA: Diagnosis not present

## 2016-06-28 DIAGNOSIS — M6281 Muscle weakness (generalized): Secondary | ICD-10-CM | POA: Diagnosis not present

## 2016-06-28 DIAGNOSIS — R262 Difficulty in walking, not elsewhere classified: Secondary | ICD-10-CM | POA: Diagnosis not present

## 2016-07-02 DIAGNOSIS — R262 Difficulty in walking, not elsewhere classified: Secondary | ICD-10-CM | POA: Diagnosis not present

## 2016-07-02 DIAGNOSIS — G20 Parkinson's disease: Secondary | ICD-10-CM | POA: Diagnosis not present

## 2016-07-02 DIAGNOSIS — M6281 Muscle weakness (generalized): Secondary | ICD-10-CM | POA: Diagnosis not present

## 2016-07-02 DIAGNOSIS — R2681 Unsteadiness on feet: Secondary | ICD-10-CM | POA: Diagnosis not present

## 2016-07-04 DIAGNOSIS — R2681 Unsteadiness on feet: Secondary | ICD-10-CM | POA: Diagnosis not present

## 2016-07-04 DIAGNOSIS — R262 Difficulty in walking, not elsewhere classified: Secondary | ICD-10-CM | POA: Diagnosis not present

## 2016-07-04 DIAGNOSIS — M6281 Muscle weakness (generalized): Secondary | ICD-10-CM | POA: Diagnosis not present

## 2016-07-04 DIAGNOSIS — G20 Parkinson's disease: Secondary | ICD-10-CM | POA: Diagnosis not present

## 2016-07-05 ENCOUNTER — Telehealth: Payer: Self-pay | Admitting: *Deleted

## 2016-07-05 ENCOUNTER — Encounter: Payer: Self-pay | Admitting: Endocrinology

## 2016-07-05 ENCOUNTER — Ambulatory Visit (INDEPENDENT_AMBULATORY_CARE_PROVIDER_SITE_OTHER): Payer: Medicare Other | Admitting: Endocrinology

## 2016-07-05 VITALS — BP 102/58 | HR 70 | Ht 70.0 in | Wt 144.0 lb

## 2016-07-05 DIAGNOSIS — I951 Orthostatic hypotension: Secondary | ICD-10-CM | POA: Diagnosis not present

## 2016-07-05 DIAGNOSIS — E2749 Other adrenocortical insufficiency: Secondary | ICD-10-CM

## 2016-07-05 DIAGNOSIS — R634 Abnormal weight loss: Secondary | ICD-10-CM | POA: Diagnosis not present

## 2016-07-05 LAB — BASIC METABOLIC PANEL
BUN: 21 mg/dL (ref 6–23)
CALCIUM: 9 mg/dL (ref 8.4–10.5)
CHLORIDE: 106 meq/L (ref 96–112)
CO2: 28 mEq/L (ref 19–32)
CREATININE: 1.04 mg/dL (ref 0.40–1.50)
GFR: 71.51 mL/min (ref >60.00)
Glucose, Bld: 92 mg/dL (ref 70–99)
Potassium: 3.3 mEq/L — ABNORMAL LOW (ref 3.5–5.1)
Sodium: 138 mEq/L (ref 135–145)

## 2016-07-05 NOTE — Patient Instructions (Signed)
Florinef 0.1 mg daily

## 2016-07-05 NOTE — Telephone Encounter (Signed)
Patient son notified and agreed.  ?

## 2016-07-05 NOTE — Progress Notes (Signed)
Patient ID: Lawrence Turner, male   DOB: 01-29-1926, 82 y.o.   MRN: [id number]              Chief complaint: Low blood pressure and Adrenal insufficiency   History of Present Illness:   Background information: The patient was diagnosed to have adrenal insufficiency when he was admitted for syncopal episodes on 04/18/16 Because of his orthostatic low blood pressures he was evaluated in the hospital Apparently also for about a year he had been losing weight He also had decreased appetite and generalized weakness Patient was evaluated with initial cortisol level of 1.2 Subsequently Cortrosyn stimulation showed baseline of 1.0 and peak level of 5.8 ACTH  level was 1.3 Because of his continued orthostatic hypotension he was started on Florinef in addition to his hydrocortisone in the hospital  RECENT history:  Blood pressure readings from the nursing home are not available He apparently had an episode of syncope when he was standing in an slumped over He had also been put on Aldactone 25 mg daily for edema by his PCP  He does not think his appetite is as good.  Previously had been getting Megace but this had been stopped No recent swelling No nausea     Past Medical History:  Diagnosis Date  . Abnormality of gait 10/19/2015  . Anemia, unspecified   . Benign neoplasm of colon    pre-cancerous polyps removed at colonoscopy 2008  . Contact dermatitis and other eczema, due to unspecified cause   . Diverticulosis of colon (without mention of hemorrhage)   . Esophageal reflux   . Hypertrophy of prostate without urinary obstruction and other lower urinary tract symptoms (LUTS)   . Mononeuritis of unspecified site   . Orthostatic hypotension 04/18/2016  . Osteoarthrosis, unspecified whether generalized or localized, pelvic region and thigh   . Other B-complex deficiencies   . Other pulmonary embolism and infarction    following cholecystectomy 2003  . Parkinson disease ([place]) 10/19/2015  .  Peripheral neuropathy ([place])   . Pure hypercholesterolemia   . Rosacea   . Seizures ([place]) 04/17/2016  . 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 Syncope and collapse 04/18/2016  . Ulcer of foot ([place]) 04/03/2016  . Unspecified constipation   . Unspecified essential hypertension   . Unspecified gastritis and gastroduodenitis without mention of hemorrhage 08/29/2006  . Unspecified hemorrhoids without mention of complication   . Unspecified vitamin D deficiency     Past Surgical History:  Procedure Laterality Date  . CATARACT EXTRACTION W/ INTRAOCULAR LENS IMPLANT Left 5/20//2013  . CHOLECYSTECTOMY, LAPAROSCOPIC  2003  . colonic polyp removal  1965  . COLONOSCOPY  08/29/2006   hemorrhoids, 3 small polyps (hyperplastic) & diverticulosis  . HEMORRHOID SURGERY  1953    Family History  Problem Relation Age of Onset  . Cancer Mother     breast  . Pneumonia Father   . Cancer Brother     Social History:  reports that he quit smoking about 53 years ago. His smoking use included Cigarettes. He quit after 15.00 years of use. He has never used smokeless tobacco. He reports that he drinks about 1.2 oz of alcohol per week . He reports that he does not use drugs.  Allergies:  Allergies  Allergen Reactions  . Lyrica [Pregabalin]     *Anticonvulsants*, fatique      Medication List       Accurate as of 07/05/16  9:59 AM. Always use your most  recent med list.          acetaminophen 325 MG tablet Commonly known as:  TYLENOL Take 325 mg by mouth. Take 2 tablets daily; take 2 every 4 hours as needed for pain   BOOST PLUS PO Take 1 Can by mouth 3 (three) times daily. One can three times a day with meals   carbidopa-levodopa 25-100 MG tablet Commonly known as:  SINEMET IR Take 1 tablet by mouth. Take 1 1/2 tablets three times daily   cyclobenzaprine 5 MG tablet Commonly known as:  FLEXERIL Take 1 tablet (5 mg total) by mouth 3 (three) times daily as needed for  muscle spasms (muscle pain and stiffness).   famotidine 20 MG tablet Commonly known as:  PEPCID Take 20 mg by mouth. Take one tablet at bedtime   feeding supplement (PRO-STAT SUGAR FREE 64) Liqd Take 30 mLs by mouth 2 (two) times daily. 30 ml twice daily to promote wound healing   FLUDROCORTISONE ACETATE PO Take by mouth. Take 0.05 mg one daily. Hold if BP > 160/90   hydrocortisone 10 MG tablet Commonly known as:  CORTEF Take by mouth. Take 2 tablets once daily; take one every evening   ibuprofen 400 MG tablet Commonly known as:  ADVIL,MOTRIN Take 1 tablet (400 mg total) by mouth every 6 (six) hours as needed for moderate pain.   lactulose 10 GM/15ML solution Commonly known as:  CHRONULAC Take 40 g by mouth daily as needed for mild constipation. Take 60 ml as needed for constipation   mirtazapine 15 MG tablet Commonly known as:  REMERON Take 15 mg by mouth at bedtime.   polyethylene glycol packet Commonly known as:  MIRALAX / GLYCOLAX Take 17 g by mouth daily as needed for mild constipation.   psyllium 58.6 % powder Commonly known as:  METAMUCIL Take 1 packet by mouth daily. One tablespoon in 4 oz liquid daily   spironolactone 25 MG tablet Commonly known as:  ALDACTONE Take 25 mg by mouth. Take one tablet daily   THERA-M Tabs TAKE ONE TABLET BY MOUTH EVERY DAY (FORMULARY SUB. FOR DAILY VITE)   Vitamin B12-Folic Acid XX123456 MCG Tabs Take by mouth. Take 2 tablets daily   zinc oxide 20 % ointment Apply 1 application topically 2 (two) times daily as needed for irritation.       LABS:  No visits with results within 1 Week(s) from this visit.  Latest known visit with results is:  Nursing Home on 06/26/2016  Component Date Value Ref Range Status  . Hemoglobin 06/25/2016 10.2* 13.5 - 17.5 g/dL Final  . HCT 06/25/2016 30* 41 - 53 % Final  . Platelets 06/25/2016 179  150 - 399 K/L Final  . WBC 06/25/2016 9.1  10^3/mL Final          Review of  Systems  Decreased appetite, currently taking 15 mg Remeron  PHYSICAL EXAM:  BP (!) 102/58   Pulse 70   Ht 5\' 10"  (1.778 m)   Wt 144 lb (65.3 kg)   SpO2 98%   BMI 20.66 kg/m   He is alert and communicative, no leg edema    ASSESSMENT:    Secondary adrenal insufficiency.  This appears to be adequately replaced with 20 mg a.m. And 10 mg p.m. His decreased apetite is not a new symptom and unlikely to be related toadrenal insufficency  Orthostatic hypotension probably likely to be a combination of adrenal insufficiency and also autonomic neuropathy associated with Parkinson's disease.  Does  However recent history of syncope and blood pressure is low normal todayeven sitting.  No further edema which was probably related to low protein levels    PLAN:    Check chemistry levels today He needs to check with PCP about restarting Megace instead of Remeron  Continue Florinef but increase it to the full  tablet daily Consider adding midodrine if continuing to have orthostatic hypotension Also the nursing home needs to send records of his blood pressure when he comes  Review progress in  weeks  Silvia Markuson 07/05/2016, 9:59 AM

## 2016-07-05 NOTE — Telephone Encounter (Signed)
Patient son, Lawrence Turner called and stated that patient has had weight loss of 160 to 130lbs over the past year. He feels that the Remeron is not working and wants to know if a different appetite booster can be prescribed. Please Advise.

## 2016-07-05 NOTE — Telephone Encounter (Signed)
I resumed the Megace today.

## 2016-07-05 NOTE — Progress Notes (Signed)
Please send a order for potassium chloride 20 mEq once daily to the nursing home.  Fax lab results also

## 2016-07-06 ENCOUNTER — Other Ambulatory Visit: Payer: Self-pay | Admitting: *Deleted

## 2016-07-06 ENCOUNTER — Encounter: Payer: Self-pay | Admitting: *Deleted

## 2016-07-06 ENCOUNTER — Telehealth: Payer: Self-pay

## 2016-07-06 DIAGNOSIS — R2681 Unsteadiness on feet: Secondary | ICD-10-CM | POA: Diagnosis not present

## 2016-07-06 DIAGNOSIS — M6281 Muscle weakness (generalized): Secondary | ICD-10-CM | POA: Diagnosis not present

## 2016-07-06 DIAGNOSIS — R262 Difficulty in walking, not elsewhere classified: Secondary | ICD-10-CM | POA: Diagnosis not present

## 2016-07-06 DIAGNOSIS — G20 Parkinson's disease: Secondary | ICD-10-CM | POA: Diagnosis not present

## 2016-07-06 MED ORDER — POTASSIUM CHLORIDE CRYS ER 20 MEQ PO TBCR: 20.0000 meq | EXTENDED_RELEASE_TABLET | Freq: Every day | ORAL | 3 refills | Status: DC

## 2016-07-06 NOTE — Telephone Encounter (Signed)
The order for Potassium Chloride cannot be verbally called in, it needs to be faxed to [phone]

## 2016-07-06 NOTE — Progress Notes (Deleted)
This encounter was created in error - please disregard.

## 2016-07-11 ENCOUNTER — Ambulatory Visit: Payer: Medicare Other | Admitting: Neurology

## 2016-07-11 ENCOUNTER — Telehealth: Payer: Self-pay

## 2016-07-11 NOTE — Telephone Encounter (Signed)
Called and spoke to pt's son. Asked if this afternoon's 4:00 appt could be r/s to 1:30 today. Son said that he would prefer to r/s to another day rather than to 1:30 today. Agreed to keep appt @ 4 pm as previously scheduled.

## 2016-07-18 NOTE — Progress Notes (Signed)
This encounter was created in error - please disregard.  This encounter was created in error - please disregard.

## 2016-07-20 ENCOUNTER — Ambulatory Visit (INDEPENDENT_AMBULATORY_CARE_PROVIDER_SITE_OTHER): Payer: Medicare Other | Admitting: Neurology

## 2016-07-20 ENCOUNTER — Encounter: Payer: Self-pay | Admitting: Neurology

## 2016-07-20 ENCOUNTER — Telehealth: Payer: Self-pay | Admitting: Adult Health

## 2016-07-20 VITALS — BP 167/93 | HR 84 | Ht 70.0 in | Wt 144.0 lb

## 2016-07-20 DIAGNOSIS — I951 Orthostatic hypotension: Secondary | ICD-10-CM | POA: Diagnosis not present

## 2016-07-20 DIAGNOSIS — G629 Polyneuropathy, unspecified: Secondary | ICD-10-CM

## 2016-07-20 DIAGNOSIS — G20 Parkinson's disease: Secondary | ICD-10-CM | POA: Diagnosis not present

## 2016-07-20 DIAGNOSIS — R269 Unspecified abnormalities of gait and mobility: Secondary | ICD-10-CM

## 2016-07-20 MED ORDER — ESCITALOPRAM OXALATE 10 MG PO TABS: 10.0000 mg | ORAL_TABLET | Freq: Every day | ORAL | 2 refills | Status: DC

## 2016-07-20 NOTE — Patient Instructions (Signed)
   We will discontinue the remeron, and start Lexapro at 10 mg daily.

## 2016-07-20 NOTE — Telephone Encounter (Signed)
[facility] I have given the patient an after visit summary indicating that the Remeron is to be discontinued. We are to start the Lexapro after the Remeron is stopped.

## 2016-07-20 NOTE — Telephone Encounter (Signed)
The patient was seen this morning by Dr. Jannifer Franklin. Was given a prescription for Lexapro 10 mg daily. [facility] from [facility] is calling wanted to make Dr. Jannifer Franklin aware that the patient is also on Remeron. Wanted to verify that he wanted the patient on Remeron and Lexapro. Please call at [phone]

## 2016-07-20 NOTE — Progress Notes (Signed)
Reason for visit: Parkinson's disease  Zacarri Vaillant is an 82 y.o. male  History of present illness:  [name] is an 82 year old left-handed white male with a history of Parkinson's disease associated with a gait disorder and orthostatic hypotension. The patient has been noted to have Addison's disease, he is on steroid supplementation. He still has some issues with orthostatic hypotension, he has been placed on Florinef. His son accompanies him today, he indicates that the extended care facility has noted very high blood pressures at times, sometimes up to A999333 systolic. The patient did fall 2 weeks ago, the fall appears to have been associated with a "fade out" episode with low blood pressure. The patient is still not eating well, he recently was placed on megestrol. The patient denies any problems with choking or swallowing. He uses a walker for ambulation. He can walk up to 500 feet without difficulty.  Past Medical History:  Diagnosis Date  . Abnormality of gait 10/19/2015  . Anemia, unspecified   . Benign neoplasm of colon    pre-cancerous polyps removed at colonoscopy 2008  . Contact dermatitis and other eczema, due to unspecified cause   . Diverticulosis of colon (without mention of hemorrhage)   . Esophageal reflux   . Hypertrophy of prostate without urinary obstruction and other lower urinary tract symptoms (LUTS)   . Mononeuritis of unspecified site   . Orthostatic hypotension 04/18/2016  . Osteoarthrosis, unspecified whether generalized or localized, pelvic region and thigh   . Other B-complex deficiencies   . Other pulmonary embolism and infarction    following cholecystectomy 2003  . Parkinson disease ([place]) 10/19/2015  . Peripheral neuropathy ([place])   . Pure hypercholesterolemia   . Rosacea   . Seizures ([place]) 04/17/2016  . 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 Syncope and collapse 04/18/2016  . Ulcer of foot ([place]) 04/03/2016  . Unspecified  constipation   . Unspecified essential hypertension   . Unspecified gastritis and gastroduodenitis without mention of hemorrhage 08/29/2006  . Unspecified hemorrhoids without mention of complication   . Unspecified vitamin D deficiency     Past Surgical History:  Procedure Laterality Date  . CATARACT EXTRACTION W/ INTRAOCULAR LENS IMPLANT Left 5/20//2013  . CHOLECYSTECTOMY, LAPAROSCOPIC  2003  . colonic polyp removal  1965  . COLONOSCOPY  08/29/2006   hemorrhoids, 3 small polyps (hyperplastic) & diverticulosis  . HEMORRHOID SURGERY  1953    Family History  Problem Relation Age of Onset  . Cancer Mother     breast  . Pneumonia Father   . Cancer Brother     Social history:  reports that he quit smoking about 53 years ago. His smoking use included Cigarettes. He quit after 15.00 years of use. He has never used smokeless tobacco. He reports that he drinks about 1.2 oz of alcohol per week . He reports that he does not use drugs.    Allergies  Allergen Reactions  . Lyrica [Pregabalin]     *Anticonvulsants*, fatique    Medications:  Prior to Admission medications   Medication Sig Start Date End Date Taking? Authorizing Provider  acetaminophen (TYLENOL) 325 MG tablet Take 325 mg by mouth. Take 2 tablets daily; take 2 every 4 hours as needed for pain 05/01/16  Yes Historical Provider, MD  Amino Acids-Protein Hydrolys (FEEDING SUPPLEMENT, PRO-STAT SUGAR FREE 64,) LIQD Take 30 mLs by mouth 2 (two) times daily. 30 ml twice daily to promote wound healing  Yes Historical Provider, MD  carbidopa-levodopa (SINEMET IR) 25-100 MG tablet Take 1 tablet by mouth. Take 1 1/2 tablets three times daily   Yes Historical Provider, MD  Cobalamine Combinations (VITAMIN B12-FOLIC ACID) XX123456 MCG TABS Take by mouth. Take 2 tablets daily   Yes Historical Provider, MD  cyclobenzaprine (FLEXERIL) 5 MG tablet Take 1 tablet (5 mg total) by mouth 3 (three) times daily as needed for muscle spasms (muscle pain  and stiffness). 04/30/16  Yes Domenic Polite, MD  famotidine (PEPCID) 20 MG tablet Take 20 mg by mouth. Take one tablet at bedtime   Yes Historical Provider, MD  fludrocortisone (FLORINEF) 0.1 MG tablet  06/28/16  Yes Historical Provider, MD  hydrocortisone (CORTEF) 10 MG tablet Take by mouth. Take 2 tablets once daily; take one every evening    Yes Historical Provider, MD  ibuprofen (ADVIL,MOTRIN) 400 MG tablet Take 1 tablet (400 mg total) by mouth every 6 (six) hours as needed for moderate pain. 04/30/16  Yes Domenic Polite, MD  lactulose (CHRONULAC) 10 GM/15ML solution Take 40 g by mouth daily as needed for mild constipation. Take 60 ml as needed for constipation    Yes Historical Provider, MD  megestrol (MEGACE) 40 MG/ML suspension  07/05/16  Yes Historical Provider, MD  Multiple Vitamins-Minerals (THERA-M) TABS TAKE ONE TABLET BY MOUTH EVERY DAY (FORMULARY SUB. FOR DAILY VITE) 05/25/16  Yes Estill Dooms, MD  Nutritional Supplements (BOOST PLUS PO) Take 1 Can by mouth 3 (three) times daily. One can three times a day with meals    Yes Historical Provider, MD  polyethylene glycol (MIRALAX / GLYCOLAX) packet Take 17 g by mouth daily as needed for mild constipation.   Yes Historical Provider, MD  potassium chloride SA (K-DUR,KLOR-CON) 20 MEQ tablet Take 1 tablet (20 mEq total) by mouth daily. 07/06/16  Yes Elayne Snare, MD  psyllium (METAMUCIL) 58.6 % powder Take 1 packet by mouth daily. One tablespoon in 4 oz liquid daily    Yes Historical Provider, MD  zinc oxide 20 % ointment Apply 1 application topically 2 (two) times daily as needed for irritation.   Yes Historical Provider, MD  escitalopram (LEXAPRO) 10 MG tablet Take 1 tablet (10 mg total) by mouth daily. 07/20/16   Kathrynn Ducking, MD    ROS:  Out of a complete 14 system review of symptoms, the patient complains only of the following symptoms, and all other reviewed systems are negative.  Fatigue Shortness of breath Speech difficulty,  weakness   Blood pressure (!) 167/93, pulse 84, height 5\' 10"  (1.778 m), weight 144 lb (65.3 kg).   Blood pressure, right arm, standing is 120/62. Blood pressure, right arm, sitting is 136/84.  Physical Exam  General: The patient is alert and cooperative at the time of the examination.  Skin: 1+ edema at ankles is noted.   Neurologic Exam  Mental status: The patient is alert and oriented x 3 at the time of the examination. The patient has apparent normal recent and remote memory, with an apparently normal attention span and concentration ability.   Cranial nerves: Facial symmetry is present. Speech is hypophonic, not aphasic. Extraocular movements are full. Visual fields are full.  Motor: The patient has good strength in all 4 extremities.  Sensory examination: Soft touch sensation is symmetric on the face, arms, and legs.  Coordination: The patient has good finger-nose-finger and heel-to-shin bilaterally.  Gait and station: The patient walks with a walker, his knees are flexed with walking. The  patient is able to ambulate with relatively good stability using a walker, he has slow turns with shuffling of the feet. Tandem gait was not attempted. Romberg is negative.  Reflexes: Deep tendon reflexes are symmetric.   Assessment/Plan:  1. Parkinson's disease   2. Gait disturbance  3. Orthostatic hypotension  Blood pressures on evaluation today are good, the patient is able to maintain blood pressure with the upright posture. The Sinemet has been increased recently taking 1.5 tablets of the 25/100 mg Sinemet 3 times daily. The patient will be taken off of Remeron, he will be switched to Lexapro. The patient still has issues with anxiety and depression. He will follow-up in about 4 months. I have encouraged the patient to remain active.   Jill Alexanders MD 07/20/2016 9:53 AM  [facility] [address] [place], [place] [address]  Phone  [phone] Fax [phone]

## 2016-08-01 ENCOUNTER — Telehealth: Payer: Self-pay | Admitting: Endocrinology

## 2016-08-01 ENCOUNTER — Telehealth: Payer: Self-pay | Admitting: Neurology

## 2016-08-01 NOTE — Telephone Encounter (Signed)
Patient son asking if Dr Dwyane Dee received notes that was  sent to him for his dad. Please advise

## 2016-08-01 NOTE — Telephone Encounter (Signed)
Returned call to pt's son to let him know that we did receive faxed BP and I&O flowsheets.

## 2016-08-01 NOTE — Telephone Encounter (Signed)
Patient's son is calling to see if we received a fax from him regarding the patient's BP readings and dietary-13 pages. Please call and advise.

## 2016-08-01 NOTE — Telephone Encounter (Signed)
Received blood pressure readings last month but not recently, they may need to resend

## 2016-08-02 ENCOUNTER — Ambulatory Visit: Payer: Medicare Other | Admitting: Endocrinology

## 2016-08-02 ENCOUNTER — Ambulatory Visit (INDEPENDENT_AMBULATORY_CARE_PROVIDER_SITE_OTHER): Payer: Medicare Other | Admitting: Endocrinology

## 2016-08-02 ENCOUNTER — Encounter: Payer: Self-pay | Admitting: Endocrinology

## 2016-08-02 VITALS — BP 134/72 | HR 80 | Ht 70.0 in | Wt 151.0 lb

## 2016-08-02 DIAGNOSIS — E2749 Other adrenocortical insufficiency: Secondary | ICD-10-CM

## 2016-08-02 DIAGNOSIS — I951 Orthostatic hypotension: Secondary | ICD-10-CM

## 2016-08-02 DIAGNOSIS — I10 Essential (primary) hypertension: Secondary | ICD-10-CM | POA: Diagnosis not present

## 2016-08-02 NOTE — Progress Notes (Signed)
Patient ID: Lawrence Turner, male   DOB: 1926/02/26, 82 y.o.   MRN: [id number]              Chief complaint: Low blood pressure and Adrenal insufficiency   History of Present Illness:   Background information: The patient was diagnosed to have adrenal insufficiency when he was admitted for syncopal episodes on 04/18/16 Because of his orthostatic low blood pressures he was evaluated in the hospital Apparently also for about a year he had been losing weight He also had decreased appetite and generalized weakness Patient was evaluated with initial cortisol level of 1.2 Subsequently Cortrosyn stimulation showed baseline of 1.0 and peak level of 5.8 ACTH  level was 1.3 Because of his continued orthostatic hypotension he was started on Florinef in addition to his hydrocortisone in the hospital  RECENT history:  On his last visit his blood pressure was relatively low and he had an episode of syncope prior to his visit His Florinef was increased from 0.05 up to 1.0 mg daily He has not had any lightheadedness or syncope Also apparently his Aldactone has been discontinued by PCP  Blood pressure readings from the nursing home are available but these are done only sitting and the blood pressure readings range from 97 up to A999333 systolic and between 56 up 123XX123 systolic in the last week or so  Otherwise feels fairly good and his appetite is improved partly from taking Megace. No nausea He has been able to walk a little with his walker  HYPOKALEMIA: He was started on potassium supplements on his last visit  Lab Results  Component Value Date   CREATININE 1.04 07/05/2016   BUN 21 07/05/2016   NA 138 07/05/2016   K 3.3 (L) 07/05/2016   CL 106 07/05/2016   CO2 28 07/05/2016      Past Medical History:  Diagnosis Date  . Abnormality of gait 10/19/2015  . Anemia, unspecified   . Benign neoplasm of colon    pre-cancerous polyps removed at colonoscopy 2008  . Contact dermatitis and other eczema, due  to unspecified cause   . Diverticulosis of colon (without mention of hemorrhage)   . Esophageal reflux   . Hypertrophy of prostate without urinary obstruction and other lower urinary tract symptoms (LUTS)   . Mononeuritis of unspecified site   . Orthostatic hypotension 04/18/2016  . Osteoarthrosis, unspecified whether generalized or localized, pelvic region and thigh   . Other B-complex deficiencies   . Other pulmonary embolism and infarction    following cholecystectomy 2003  . Parkinson disease ([place]) 10/19/2015  . Peripheral neuropathy ([place])   . Pure hypercholesterolemia   . Rosacea   . Seizures ([place]) 04/17/2016  . 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 Syncope and collapse 04/18/2016  . Ulcer of foot ([place]) 04/03/2016  . Unspecified constipation   . Unspecified essential hypertension   . Unspecified gastritis and gastroduodenitis without mention of hemorrhage 08/29/2006  . Unspecified hemorrhoids without mention of complication   . Unspecified vitamin D deficiency     Past Surgical History:  Procedure Laterality Date  . CATARACT EXTRACTION W/ INTRAOCULAR LENS IMPLANT Left 5/20//2013  . CHOLECYSTECTOMY, LAPAROSCOPIC  2003  . colonic polyp removal  1965  . COLONOSCOPY  08/29/2006   hemorrhoids, 3 small polyps (hyperplastic) & diverticulosis  . HEMORRHOID SURGERY  1953    Family History  Problem Relation Age of Onset  . Cancer Mother     breast  . Pneumonia Father   .  Cancer Brother     Social History:  reports that he quit smoking about 53 years ago. His smoking use included Cigarettes. He quit after 15.00 years of use. He has never used smokeless tobacco. He reports that he drinks about 1.2 oz of alcohol per week . He reports that he does not use drugs.  Allergies:  Allergies  Allergen Reactions  . Lyrica [Pregabalin]     *Anticonvulsants*, fatique      Medication List       Accurate as of 08/02/16 12:49 PM. Always use your most recent med  list.          acetaminophen 325 MG tablet Commonly known as:  TYLENOL Take 325 mg by mouth. Take 2 tablets daily; take 2 every 4 hours as needed for pain   BOOST PLUS PO Take 1 Can by mouth 3 (three) times daily. One can three times a day with meals   carbidopa-levodopa 25-100 MG tablet Commonly known as:  SINEMET IR Take 1 tablet by mouth. Take 1 1/2 tablets three times daily   cyclobenzaprine 5 MG tablet Commonly known as:  FLEXERIL Take 1 tablet (5 mg total) by mouth 3 (three) times daily as needed for muscle spasms (muscle pain and stiffness).   escitalopram 10 MG tablet Commonly known as:  LEXAPRO Take 1 tablet (10 mg total) by mouth daily.   famotidine 20 MG tablet Commonly known as:  PEPCID Take 20 mg by mouth. Take one tablet at bedtime   feeding supplement (PRO-STAT SUGAR FREE 64) Liqd Take 30 mLs by mouth 2 (two) times daily. 30 ml twice daily to promote wound healing   fludrocortisone 0.1 MG tablet Commonly known as:  FLORINEF   hydrocortisone 10 MG tablet Commonly known as:  CORTEF Take by mouth. Take 2 tablets once daily; take one every evening   ibuprofen 400 MG tablet Commonly known as:  ADVIL,MOTRIN Take 1 tablet (400 mg total) by mouth every 6 (six) hours as needed for moderate pain.   lactulose 10 GM/15ML solution Commonly known as:  CHRONULAC Take 40 g by mouth daily as needed for mild constipation. Take 60 ml as needed for constipation   megestrol 40 MG/ML suspension Commonly known as:  MEGACE   polyethylene glycol packet Commonly known as:  MIRALAX / GLYCOLAX Take 17 g by mouth daily as needed for mild constipation.   potassium chloride SA 20 MEQ tablet Commonly known as:  K-DUR,KLOR-CON Take 1 tablet (20 mEq total) by mouth daily.   psyllium 58.6 % powder Commonly known as:  METAMUCIL Take 1 packet by mouth daily. One tablespoon in 4 oz liquid daily   THERA-M Tabs TAKE ONE TABLET BY MOUTH EVERY DAY (FORMULARY SUB. FOR DAILY VITE)     Vitamin B12-Folic Acid XX123456 MCG Tabs Take by mouth. Take 2 tablets daily   zinc oxide 20 % ointment Apply 1 application topically 2 (two) times daily as needed for irritation.       LABS:  No visits with results within 1 Week(s) from this visit.  Latest known visit with results is:  Office Visit on 07/05/2016  Component Date Value Ref Range Status  . Sodium 07/05/2016 138  135 - 145 mEq/L Final  . Potassium 07/05/2016 3.3* 3.5 - 5.1 mEq/L Final  . Chloride 07/05/2016 106  96 - 112 mEq/L Final  . CO2 07/05/2016 28  19 - 32 mEq/L Final  . Glucose, Bld 07/05/2016 92  70 - 99 mg/dL Final  . BUN  07/05/2016 21  6 - 23 mg/dL Final  . Creatinine, Ser 07/05/2016 1.04  0.40 - 1.50 mg/dL Final  . Calcium 07/05/2016 9.0  8.4 - 10.5 mg/dL Final  . GFR 07/05/2016 71.51  >60.00 mL/min Final          Review of Systems  Decreased appetite, currently taking 15 mg Remeron  PHYSICAL EXAM:  BP 134/72   Pulse 80   Ht 5\' 10"  (1.778 m)   Wt 151 lb (68.5 kg)   SpO2 98%   BMI 21.67 kg/m   He is alert and communicative No pedal edema    ASSESSMENT:    Secondary adrenal insufficiency.  This appears to be adequately replaced with 20 mg a.m. and 10 mg in p.m. no symptoms suggestive of adrenal insufficiency.  Orthostatic hypotension probably likely to be a combination of adrenal insufficiency and also autonomic neuropathy associated with Parkinson's disease.  Blood pressure is unreliable at the nursing home as the readings are widely fluctuating even sitting down.  Blood pressure is not out of range today and no change on standing up    PLAN:   Check chemistry levels today, this was apparently drawn by the nursing home. Will continue the same dose of Florinef 0.1 mg daily and hydrocortisone  Follow-up in 3 months   Kate Larock 08/02/2016, 12:49 PM

## 2016-08-02 NOTE — Telephone Encounter (Signed)
Have the most current and the ones from last month

## 2016-08-06 ENCOUNTER — Telehealth: Payer: Self-pay | Admitting: Neurology

## 2016-08-06 ENCOUNTER — Telehealth: Payer: Self-pay | Admitting: *Deleted

## 2016-08-06 NOTE — Telephone Encounter (Signed)
Patient son, [name] called and stated that patient is Sleeping all the time, lethargic for 5 days now. Wanted an appointment with you but none available. Son stated that his mother has an appointment with you tomorrow and will discuss this with you.

## 2016-08-06 NOTE — Telephone Encounter (Signed)
Patient's son is calling to have Dr. Jannifer Franklin call the patient. The patient is lathargicand weak for the last 4-5 days. Please cal patient at [phone] and discuss,

## 2016-08-06 NOTE — Telephone Encounter (Signed)
I called again, left a message, I will call back tomorrow morning.

## 2016-08-06 NOTE — Telephone Encounter (Signed)
I called patient, left message, I will call back later. 

## 2016-08-06 NOTE — Telephone Encounter (Signed)
OK 

## 2016-08-07 MED ORDER — CARBIDOPA-LEVODOPA ER 50-200 MG PO TBCR: 1.0000 | EXTENDED_RELEASE_TABLET | Freq: Three times a day (TID) | ORAL | Status: DC

## 2016-08-07 NOTE — Addendum Note (Signed)
Addended by: Margette Fast on: 08/07/2016 05:47 PM   Modules accepted: Orders

## 2016-08-07 NOTE — Telephone Encounter (Signed)
I called patient. The patient indicates that he is having increased problems with tremor and difficulty with walking. He claims that his standing blood pressure today was 123XX123 systolic. He is not sleeping well at night, claims that he has some discomfort in the feet associated with a neuropathy.  He is sleeping during the daytime. We will go up on the Sinemet taking the 50/200 mg CR tablets 3 times daily. I have called the [facility]. Fax number there is [phone].  We may need to add medication for sleep and for neuropathy at nighttime in the future.

## 2016-08-08 NOTE — Telephone Encounter (Signed)
Patients son [name] would like a call back please.

## 2016-08-08 NOTE — Telephone Encounter (Signed)
Returned call to pt's son and updated him on increase in Sinemet. Mentioned that PCP d/c'd Lexapro and started pt on Cymbalta to see if it might help w/ neuropathy and depression. Agreed to call back to let us know how pt is doing w/ med changes.

## 2016-08-08 NOTE — Addendum Note (Signed)
Addended by: Monte Fantasia on: 08/08/2016 05:02 PM   Modules accepted: Orders

## 2016-08-09 DIAGNOSIS — D649 Anemia, unspecified: Secondary | ICD-10-CM | POA: Diagnosis not present

## 2016-08-10 ENCOUNTER — Telehealth: Payer: Self-pay

## 2016-08-10 NOTE — Telephone Encounter (Signed)
Physician Orders for [facility] faxed at [phone]

## 2016-08-19 ENCOUNTER — Encounter: Payer: Self-pay | Admitting: Internal Medicine

## 2016-08-19 ENCOUNTER — Encounter: Payer: Self-pay | Admitting: Neurology

## 2016-08-21 ENCOUNTER — Encounter: Payer: Self-pay | Admitting: Nurse Practitioner

## 2016-08-21 ENCOUNTER — Non-Acute Institutional Stay: Payer: Medicare Other | Admitting: Nurse Practitioner

## 2016-08-21 DIAGNOSIS — R5383 Other fatigue: Secondary | ICD-10-CM | POA: Diagnosis not present

## 2016-08-21 DIAGNOSIS — K5900 Constipation, unspecified: Secondary | ICD-10-CM

## 2016-08-21 DIAGNOSIS — K219 Gastro-esophageal reflux disease without esophagitis: Secondary | ICD-10-CM

## 2016-08-21 DIAGNOSIS — R0602 Shortness of breath: Secondary | ICD-10-CM | POA: Diagnosis not present

## 2016-08-21 DIAGNOSIS — I951 Orthostatic hypotension: Secondary | ICD-10-CM | POA: Diagnosis not present

## 2016-08-21 DIAGNOSIS — R5381 Other malaise: Secondary | ICD-10-CM

## 2016-08-21 DIAGNOSIS — R262 Difficulty in walking, not elsewhere classified: Secondary | ICD-10-CM | POA: Diagnosis not present

## 2016-08-21 DIAGNOSIS — N390 Urinary tract infection, site not specified: Secondary | ICD-10-CM | POA: Diagnosis not present

## 2016-08-21 DIAGNOSIS — I509 Heart failure, unspecified: Secondary | ICD-10-CM | POA: Diagnosis not present

## 2016-08-21 DIAGNOSIS — G20 Parkinson's disease: Secondary | ICD-10-CM | POA: Diagnosis not present

## 2016-08-21 DIAGNOSIS — R2681 Unsteadiness on feet: Secondary | ICD-10-CM | POA: Diagnosis not present

## 2016-08-21 DIAGNOSIS — M6281 Muscle weakness (generalized): Secondary | ICD-10-CM | POA: Diagnosis not present

## 2016-08-21 NOTE — Assessment & Plan Note (Signed)
Cogwheeling of the right wrist. Pressured speech. Soft-spoken. Change in handwriting. Unstable gait. Continue Sinemet 1/2 tid 06/12/16 per Neurology

## 2016-08-21 NOTE — Assessment & Plan Note (Signed)
Check BNP

## 2016-08-21 NOTE — Assessment & Plan Note (Signed)
Stable, continue prn MiraLax, Lactulose, daily Metamucil.

## 2016-08-21 NOTE — Assessment & Plan Note (Signed)
Blood pressure is elevated at [facility], continue Florinef 0.05mg  prn,  hold for Bp>160/90.  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 F/u Neurology.

## 2016-08-21 NOTE — Assessment & Plan Note (Signed)
Worsened in the past 2 weeks, sleeps more, DOE, denied cough, sputum production, chest pain, palpitation, he is afebrile, no O2 desaturation today, update CXR CBC CMP BNP UA C/S

## 2016-08-21 NOTE — Assessment & Plan Note (Signed)
Stable, continue Pepcid.  

## 2016-08-21 NOTE — Progress Notes (Signed)
Location:  [facility] Room Number: 65 Place of Service:  ALF [phone]) Provider:  Brylyn Novakovich, Manxie  NP   Jeanmarie Hubert, MD  Patient Care Team: Estill Dooms, MD as PCP - General (Internal Medicine) Leler Brion Otho Darner, NP as Nurse Practitioner (Nurse Practitioner) Kathrynn Ducking, MD as Consulting Physician (Neurology)  Extended Emergency Contact Information Primary Emergency Contact: Lucita Ferrara Address: [address]           [place], [place] [address] [name] of [place] Phone: [phone] Relation: Spouse Secondary Emergency Contact: Klosinski,Clifford  [place] Phone: [phone] Relation: Son  Code Status:  DNR Goals of care: Advanced Directive information Advanced Directives 08/21/2016  Does Patient Have a Medical Advance Directive? Yes  Type of Paramedic of [place];Out of facility DNR (pink MOST or yellow form);Living will  Does patient want to make changes to medical advance directive? No - Patient declined  Copy of [place] in Chart? Yes  Pre-existing out of facility DNR order (yellow form or pink MOST form) Yellow form placed in chart (order not valid for inpatient use)     Chief Complaint  Patient presents with  . Acute Visit    HPI:  Pt is a 83 y.o. male seen today for an acute visit for malaise and fatigue, worsened in the past 2 weeks, sleeps more, DOE, denied cough, sputum production, chest pain, palpitation, he is afebrile, no O2 desaturation todoay   Hx of Parkinson's dx, taking Sinemet 25/100mg  1/2 tab tid. Orthostatic hypotension, managed with Florinef.  Past Medical History:  Diagnosis Date  . Abnormality of gait 10/19/2015  . Anemia, unspecified   . Benign neoplasm of colon    pre-cancerous polyps removed at colonoscopy 2008  . Contact dermatitis and other eczema, due to unspecified cause   . Diverticulosis of colon (without mention of hemorrhage)   .  Esophageal reflux   . Hypertrophy of prostate without urinary obstruction and other lower urinary tract symptoms (LUTS)   . Mononeuritis of unspecified site   . Orthostatic hypotension 04/18/2016  . Osteoarthrosis, unspecified whether generalized or localized, pelvic region and thigh   . Other B-complex deficiencies   . Other pulmonary embolism and infarction    following cholecystectomy 2003  . Parkinson disease ([place]) 10/19/2015  . Peripheral neuropathy ([place])   . Pure hypercholesterolemia   . Rosacea   . Seizures ([place]) 04/17/2016  . 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 Syncope and collapse 04/18/2016  . Ulcer of foot ([place]) 04/03/2016  . Unspecified constipation   . Unspecified essential hypertension   . Unspecified gastritis and gastroduodenitis without mention of hemorrhage 08/29/2006  . Unspecified hemorrhoids without mention of complication   . Unspecified vitamin D deficiency    Past Surgical History:  Procedure Laterality Date  . CATARACT EXTRACTION W/ INTRAOCULAR LENS IMPLANT Left 5/20//2013  . CHOLECYSTECTOMY, LAPAROSCOPIC  2003  . colonic polyp removal  1965  . COLONOSCOPY  08/29/2006   hemorrhoids, 3 small polyps (hyperplastic) & diverticulosis  . HEMORRHOID SURGERY  1953    Allergies  Allergen Reactions  . Lyrica [Pregabalin]     *Anticonvulsants*, fatique    Allergies as of 08/21/2016      Reactions   Lyrica [pregabalin]    *Anticonvulsants*, fatique      Medication List       Accurate as of 08/21/16  4:49 PM. Always use your most recent med  list.          acetaminophen 325 MG tablet Commonly known as:  TYLENOL Take 325 mg by mouth. Take 2 tablets daily; take 2 every 4 hours as needed for pain   BOOST PLUS PO Take 1 Can by mouth 3 (three) times daily. One can three times a day with meals   carbidopa-levodopa 50-200 MG tablet Commonly known as:  SINEMET CR Take 1 tablet by mouth 3 (three) times daily.   cyclobenzaprine 5 MG  tablet Commonly known as:  FLEXERIL Take 1 tablet (5 mg total) by mouth 3 (three) times daily as needed for muscle spasms (muscle pain and stiffness).   DULoxetine 20 MG capsule Commonly known as:  CYMBALTA Take 20 mg by mouth daily.   famotidine 20 MG tablet Commonly known as:  PEPCID Take 20 mg by mouth. Take one tablet at bedtime   feeding supplement (PRO-STAT SUGAR FREE 64) Liqd Take 30 mLs by mouth 2 (two) times daily. 30 ml twice daily to promote wound healing   fludrocortisone 0.1 MG tablet Commonly known as:  FLORINEF   hydrocortisone 10 MG tablet Commonly known as:  CORTEF Take by mouth. Take 2 tablets once daily; take one every evening   ibuprofen 400 MG tablet Commonly known as:  ADVIL,MOTRIN Take 1 tablet (400 mg total) by mouth every 6 (six) hours as needed for moderate pain.   lactulose 10 GM/15ML solution Commonly known as:  CHRONULAC Take 40 g by mouth daily as needed for mild constipation. Take 60 ml as needed for constipation   megestrol 40 MG/ML suspension Commonly known as:  MEGACE   polyethylene glycol packet Commonly known as:  MIRALAX / GLYCOLAX Take 17 g by mouth daily as needed for mild constipation.   potassium chloride SA 20 MEQ tablet Commonly known as:  K-DUR,KLOR-CON Take 1 tablet (20 mEq total) by mouth daily.   psyllium 58.6 % powder Commonly known as:  METAMUCIL Take 1 packet by mouth daily. One tablespoon in 4 oz liquid daily   THERA-M Tabs TAKE ONE TABLET BY MOUTH EVERY DAY (FORMULARY SUB. FOR DAILY VITE)   Vitamin B12-Folic Acid XX123456 MCG Tabs Take by mouth. Take 2 tablets daily   zinc oxide 20 % ointment Apply 1 application topically 2 (two) times daily as needed for irritation.       Review of Systems  Constitutional: Positive for activity change and fatigue. Negative for chills, diaphoresis and fever.  HENT: Negative for congestion, ear pain and hearing loss.   Eyes: Negative for photophobia, pain, discharge and  redness.  Respiratory: Positive for shortness of breath. Negative for cough and wheezing.   Cardiovascular: Positive for leg swelling. Negative for chest pain and palpitations.       BLE edema Postural drops in BP. Very high BP at times.  Gastrointestinal: Positive for constipation. Negative for abdominal pain, blood in stool, diarrhea and nausea.  Genitourinary: Negative.   Musculoskeletal: Positive for arthralgias. Negative for back pain, myalgias and neck pain.  Skin:       Multiple seborrheic keratoses.   Neurological: Positive for weakness. Negative for tremors, seizures and headaches.       Complaint of loss of motor skills and changes in. He is unstable with walking. He has a known peripheral neuropathy of uncertain origin. Micrographia. Pressured, soft speech that is nearly inaudible. History of parkinsonism currently treated with Sinemet.  Hematological:       History of anemia  Psychiatric/Behavioral: The patient is nervous/anxious.  Immunization History  Administered Date(s) Administered  . DTaP 09/01/2007  . Influenza-Unspecified 07/04/2014, 05/19/2015, 06/01/2016  . Pneumococcal Polysaccharide-23 06/11/2002   Pertinent  Health Maintenance Due  Topic Date Due  . PNA vac Low Risk Adult (2 of 2 - PCV13) 06/12/2003  . INFLUENZA VACCINE  Completed   Fall Risk  04/03/2016 02/14/2016 01/24/2016 01/24/2016 10/25/2015  Falls in the past year? No No Yes No No  Number falls in past yr: - - 2 or more - -  Injury with Fall? - - No - -  Risk Factor Category  - - - - -  Risk for fall due to : - - - - -  Risk for fall due to (comments): - - - - -  Follow up - - - - -   Functional Status Survey:    Vitals:   08/21/16 1550  BP: 132/66  Pulse: 83  Resp: (!) 24  Temp: 98 F (36.7 C)  SpO2: 91%  Weight: 142 lb 9.6 oz (64.7 kg)  Height: 5\' 10"  (1.778 m)   Body mass index is 20.46 kg/m. Physical Exam  Constitutional: He is oriented to person, place, and time.  Thin. Elderly.   HENT:  Right Ear: External ear normal.  Left Ear: External ear normal.  Nose: Nose normal.  Mouth/Throat: Oropharynx is clear and moist. No oropharyngeal exudate.  Eyes: Conjunctivae are normal. Pupils are equal, round, and reactive to light.  Neck: No JVD present. No tracheal deviation present. No thyromegaly present.  Cardiovascular: Normal rate, regular rhythm, normal heart sounds and intact distal pulses.  Exam reveals no gallop and no friction rub.   No murmur heard. Pulmonary/Chest: No respiratory distress. He has no wheezes. He has no rales. He exhibits no tenderness.  Abdominal: He exhibits no distension and no mass. There is no tenderness.  Musculoskeletal: Normal range of motion. He exhibits edema (1+ ipedal) and tenderness.  unstable gait. Trace edema BLE. Pain with movement of the left hip and thigh  Lymphadenopathy:    He has no cervical adenopathy.  Neurological: He is alert and oriented to person, place, and time. He displays abnormal reflex (Absent at both knees). No cranial nerve deficit. Coordination normal.  Cogwheeling most evident in the right wrist. Gait is mildly abnormal with a suggestion of a festinating gait. Speech is soft and mildly pressured. There is a mild tremor at rest. There is no focal weakness. 1/14//15 MMSE 30/30. Passed clock drawing test.  Skin: No rash noted. No erythema. No pallor.  Healed corn at the plantar aspect of the the left 5th MTJ  Psychiatric:  Patient appears mildly anxious.     Labs reviewed:  Recent Labs  04/19/16 0434  05/08/16 1653  05/24/16 1500 05/28/16 06/04/16 07/05/16 1015  NA 141  < > 141  < > 139 142 142 138  K 4.1  < > 3.6  < > 3.6  --  3.6 3.3*  CL 111  < > 106  --  106  --   --  106  CO2 22  < > 22  --  25  --   --  28  GLUCOSE 96  < > 153*  --  141*  --   --  92  BUN 26*  < > 33*  < > 27* 18 17 21   CREATININE 1.16  < > 1.03  < > 0.95 1.0 1.0 1.04  CALCIUM 9.0  < > 8.2*  --  8.0*  --   --  9.0  MG 2.1  --   --    --   --   --   --   --   < > = values in this interval not displayed.  Recent Labs  04/19/16 0434 05/08/16 1653 05/15/16 05/24/16 1500  AST 23 27 22 29   ALT 19 30 29 10   ALKPHOS 43 39 27 39  BILITOT 0.4 0.3  --  0.5  PROT 5.9* 5.2*  --  5.6*  ALBUMIN 3.3* 3.2*  --  3.0*    Recent Labs  04/22/16 0523 04/23/16 0357 04/24/16 0654 04/26/16 0400 05/15/16 06/25/16  WBC 9.4 9.3 10.0 9.1 6.4 9.1  NEUTROABS 7.5 7.2 8.0*  --   --   --   HGB 10.2* 10.4* 10.7* 9.8* 93.0* 10.2*  HCT 31.5* 31.0* 32.6* 30.2* 28* 30*  MCV 95.2 94.2 94.8 95.3  --   --   PLT 161 164 149* 156 167 179   Lab Results  Component Value Date   TSH 2.297 04/24/2016   No results found for: HGBA1C Lab Results  Component Value Date   CHOL 166 05/24/2014   HDL 45 05/24/2014   LDLCALC 110 05/24/2014   TRIG 54 05/24/2014    Significant Diagnostic Results in last 30 days:  No results found.  Assessment/Plan Malaise and fatigue Worsened in the past 2 weeks, sleeps more, DOE, denied cough, sputum production, chest pain, palpitation, he is afebrile, no O2 desaturation today, update CXR CBC CMP BNP UA C/S  Parkinson's disease (HCC) Cogwheeling of the right wrist. Pressured speech. Soft-spoken. Change in handwriting. Unstable gait. Continue Sinemet 1/2 tid 06/12/16 per Neurology   Orthostatic hypotension Blood pressure is elevated at tims, continue Florinef 0.05mg  prn,  hold for Bp>160/90.  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 F/u Neurology.    CHF (congestive heart failure) (HCC) Check BNP  Constipation Stable, continue prn MiraLax, Lactulose, daily Metamucil.   Esophageal reflux Stable, continue Pepcid.     Family/ staff Communication: AL  Labs/tests ordered:  CBC CMP BNP CXR UA C/S

## 2016-08-23 DIAGNOSIS — R2681 Unsteadiness on feet: Secondary | ICD-10-CM | POA: Diagnosis not present

## 2016-08-23 DIAGNOSIS — G20 Parkinson's disease: Secondary | ICD-10-CM | POA: Diagnosis not present

## 2016-08-23 DIAGNOSIS — D649 Anemia, unspecified: Secondary | ICD-10-CM | POA: Diagnosis not present

## 2016-08-23 DIAGNOSIS — M6281 Muscle weakness (generalized): Secondary | ICD-10-CM | POA: Diagnosis not present

## 2016-08-23 DIAGNOSIS — R262 Difficulty in walking, not elsewhere classified: Secondary | ICD-10-CM | POA: Diagnosis not present

## 2016-08-23 DIAGNOSIS — R7989 Other specified abnormal findings of blood chemistry: Secondary | ICD-10-CM | POA: Diagnosis not present

## 2016-08-23 DIAGNOSIS — I10 Essential (primary) hypertension: Secondary | ICD-10-CM | POA: Diagnosis not present

## 2016-08-23 LAB — CBC AND DIFFERENTIAL
HEMATOCRIT: 33 % — AB (ref 41–53)
HEMOGLOBIN: 11 g/dL — AB (ref 13.5–17.5)
PLATELETS: 162 10*3/uL (ref 150–399)
WBC: 9 10^3/mL

## 2016-08-23 LAB — BASIC METABOLIC PANEL
BUN: 21 mg/dL (ref 4–21)
Creatinine: 1 mg/dL (ref <=1.3)
GLUCOSE: 84 mg/dL
Potassium: 3.4 mmol/L (ref 3.4–5.3)
Sodium: 138 mmol/L (ref 137–147)

## 2016-08-23 LAB — HEPATIC FUNCTION PANEL
ALK PHOS: 26 U/L (ref 25–125)
ALT: 6 U/L — AB (ref 10–40)
AST: 14 U/L (ref 14–40)
Bilirubin, Total: 0.6 mg/dL

## 2016-08-24 ENCOUNTER — Other Ambulatory Visit: Payer: Self-pay | Admitting: *Deleted

## 2016-08-28 DIAGNOSIS — G20 Parkinson's disease: Secondary | ICD-10-CM | POA: Diagnosis not present

## 2016-08-28 DIAGNOSIS — R2681 Unsteadiness on feet: Secondary | ICD-10-CM | POA: Diagnosis not present

## 2016-08-28 DIAGNOSIS — R262 Difficulty in walking, not elsewhere classified: Secondary | ICD-10-CM | POA: Diagnosis not present

## 2016-08-28 DIAGNOSIS — M6281 Muscle weakness (generalized): Secondary | ICD-10-CM | POA: Diagnosis not present

## 2016-08-30 DIAGNOSIS — R2681 Unsteadiness on feet: Secondary | ICD-10-CM | POA: Diagnosis not present

## 2016-08-30 DIAGNOSIS — G20 Parkinson's disease: Secondary | ICD-10-CM | POA: Diagnosis not present

## 2016-08-30 DIAGNOSIS — R262 Difficulty in walking, not elsewhere classified: Secondary | ICD-10-CM | POA: Diagnosis not present

## 2016-08-30 DIAGNOSIS — M6281 Muscle weakness (generalized): Secondary | ICD-10-CM | POA: Diagnosis not present

## 2016-09-01 ENCOUNTER — Encounter: Payer: Self-pay | Admitting: Internal Medicine

## 2016-09-07 DIAGNOSIS — M6281 Muscle weakness (generalized): Secondary | ICD-10-CM | POA: Diagnosis not present

## 2016-09-07 DIAGNOSIS — G20 Parkinson's disease: Secondary | ICD-10-CM | POA: Diagnosis not present

## 2016-09-07 DIAGNOSIS — R262 Difficulty in walking, not elsewhere classified: Secondary | ICD-10-CM | POA: Diagnosis not present

## 2016-09-07 DIAGNOSIS — R2681 Unsteadiness on feet: Secondary | ICD-10-CM | POA: Diagnosis not present

## 2016-09-11 DIAGNOSIS — M6281 Muscle weakness (generalized): Secondary | ICD-10-CM | POA: Diagnosis not present

## 2016-09-11 DIAGNOSIS — R2681 Unsteadiness on feet: Secondary | ICD-10-CM | POA: Diagnosis not present

## 2016-09-11 DIAGNOSIS — R262 Difficulty in walking, not elsewhere classified: Secondary | ICD-10-CM | POA: Diagnosis not present

## 2016-09-11 DIAGNOSIS — G20 Parkinson's disease: Secondary | ICD-10-CM | POA: Diagnosis not present

## 2016-09-13 DIAGNOSIS — M6281 Muscle weakness (generalized): Secondary | ICD-10-CM | POA: Diagnosis not present

## 2016-09-13 DIAGNOSIS — G20 Parkinson's disease: Secondary | ICD-10-CM | POA: Diagnosis not present

## 2016-09-13 DIAGNOSIS — R262 Difficulty in walking, not elsewhere classified: Secondary | ICD-10-CM | POA: Diagnosis not present

## 2016-09-13 DIAGNOSIS — R2681 Unsteadiness on feet: Secondary | ICD-10-CM | POA: Diagnosis not present

## 2016-09-18 DIAGNOSIS — G20 Parkinson's disease: Secondary | ICD-10-CM | POA: Diagnosis not present

## 2016-09-18 DIAGNOSIS — M6281 Muscle weakness (generalized): Secondary | ICD-10-CM | POA: Diagnosis not present

## 2016-09-18 DIAGNOSIS — R2681 Unsteadiness on feet: Secondary | ICD-10-CM | POA: Diagnosis not present

## 2016-09-18 DIAGNOSIS — R262 Difficulty in walking, not elsewhere classified: Secondary | ICD-10-CM | POA: Diagnosis not present

## 2016-09-24 ENCOUNTER — Telehealth: Payer: Self-pay | Admitting: *Deleted

## 2016-09-24 ENCOUNTER — Encounter: Payer: Self-pay | Admitting: Internal Medicine

## 2016-09-24 ENCOUNTER — Telehealth: Payer: Self-pay | Admitting: Neurology

## 2016-09-24 ENCOUNTER — Non-Acute Institutional Stay: Payer: Medicare Other | Admitting: Internal Medicine

## 2016-09-24 DIAGNOSIS — R634 Abnormal weight loss: Secondary | ICD-10-CM

## 2016-09-24 DIAGNOSIS — G20 Parkinson's disease: Secondary | ICD-10-CM | POA: Diagnosis not present

## 2016-09-24 DIAGNOSIS — R609 Edema, unspecified: Secondary | ICD-10-CM

## 2016-09-24 DIAGNOSIS — I509 Heart failure, unspecified: Secondary | ICD-10-CM | POA: Diagnosis not present

## 2016-09-24 DIAGNOSIS — R5381 Other malaise: Secondary | ICD-10-CM

## 2016-09-24 DIAGNOSIS — E538 Deficiency of other specified B group vitamins: Secondary | ICD-10-CM

## 2016-09-24 DIAGNOSIS — R5383 Other fatigue: Secondary | ICD-10-CM

## 2016-09-24 NOTE — Telephone Encounter (Signed)
Spoke with son, Dr. Nyoka Cowden will be seeing his father today in his room. Son was find with this. I informed Dr. Nyoka Cowden of all of son's concerns.

## 2016-09-24 NOTE — Telephone Encounter (Signed)
I called and talked with his son. The patient has had issues with shortness of breath, unable to talk effectively because of this issue. The patient has had a recent chest x-ray. The son indicates that his blood pressures are not too low, he has had difficulty with dyspnea when his blood pressures will bottom out.  The patient will require a medical evaluation for this issue, this does not appear to be directly related to his Parkinson's disease.

## 2016-09-24 NOTE — Telephone Encounter (Signed)
Pt's son called to advise he saw the pt yesterday, he is having difficulty with breathing, he is concerned it could be parkinson's or neuropathy. Says the pt cannot finish a sentence without having to take a breath, difficulty inhaling. He is requesting an appt with Dr Jannifer Franklin soon. Thank you

## 2016-09-24 NOTE — Telephone Encounter (Signed)
Patient son, Lawrence Turner called and wanted an appointment for patient for tomorrow at [facility]. Stated that his dad has increased labor breathing and can't finish a sentence without taking a breath. He wants Dr. Nyoka Cowden to evaluate him and can come with him tomorrow to [facility]. No available appointments, Please Advise.

## 2016-09-24 NOTE — Progress Notes (Signed)
Progress Note    Location:  [facility] Room Number: [phone] Place of Service:  ALF [phone]) Provider: Jeanmarie Hubert, MD  Patient Care Team: Estill Dooms, MD as PCP - General (Internal Medicine) Man Otho Darner, NP as Nurse Practitioner (Nurse Practitioner) Kathrynn Ducking, MD as Consulting Physician (Neurology)  Extended Emergency Contact Information Primary Emergency Contact: Lucita Ferrara Address: [address]           [place], [place] [address] [name] of [place] Phone: [phone] Relation: Spouse Secondary Emergency Contact: Sorbo,Clifford  [place] Phone: [phone] Relation: Son  Code Status:  DNR Goals of care: Advanced Directive information Advanced Directives 09/24/2016  Does Patient Have a Medical Advance Directive? Yes  Type of Paramedic of [place];Living will;Out of facility DNR (pink MOST or yellow form)  Does patient want to make changes to medical advance directive? -  Copy of [place] in Chart? Yes  Pre-existing out of facility DNR order (yellow form or pink MOST form) Yellow form placed in chart (order not valid for inpatient use)     Chief Complaint  Patient presents with  . Acute Visit    repiratory shallow, can't finish words without taking a breath. Son wants C02 test done. Son spoke with Neurologist he said the neuropathy could cause diaphragm maybe be elevated.  Per nurse patient is unable to do ADL's wife is doing them for him. Patient fell 09/17/16.     HPI:  Pt is a 83 y.o. male seen today for an acute visit for evaluation of dyspnea. Soon says he frequently seems to breathe rapidly as if he is SOB. Also has times he does not seem able to say more than 5 words without giving out of breath. No cough or chest pain.No significant increase in edema. CKR on 08/21/16 showed borderline cardiomegaly, but no increased intersitial markings of  effusion.  Congestive heart failure, unspecified congestive heart failure chronicity, unspecified congestive heart failure type ([place]) - recent BNP 908  Edema, unspecified type - present bilaterally at about 1+  Parkinson's disease ([place]) - stable on current meds.  Weight loss - stable over the last month and in down 9# since Dec 2017.  Malaise and fatigue - tired all the time. Gives out easily. May not sleep well. Soft snoring .  B12 deficiency on oral supplements.   Past Medical History:  Diagnosis Date  . Abnormality of gait 10/19/2015  . Anemia, unspecified   . Benign neoplasm of colon    pre-cancerous polyps removed at colonoscopy 2008  . Contact dermatitis and other eczema, due to unspecified cause   . Diverticulosis of colon (without mention of hemorrhage)   . Esophageal reflux   . Hypertrophy of prostate without urinary obstruction and other lower urinary tract symptoms (LUTS)   . Mononeuritis of unspecified site   . Orthostatic hypotension 04/18/2016  . Osteoarthrosis, unspecified whether generalized or localized, pelvic region and thigh   . Other B-complex deficiencies   . Other pulmonary embolism and infarction    following cholecystectomy 2003  . Parkinson disease ([place]) 10/19/2015  . Peripheral neuropathy ([place])   . Pure hypercholesterolemia   . Rosacea   . Seizures ([place]) 04/17/2016  . 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 Syncope and collapse 04/18/2016  . Ulcer of foot ([place]) 04/03/2016  . Unspecified constipation   . Unspecified essential hypertension   . Unspecified gastritis  and gastroduodenitis without mention of hemorrhage 08/29/2006  . Unspecified hemorrhoids without mention of complication   . Unspecified vitamin D deficiency    Past Surgical History:  Procedure Laterality Date  . CATARACT EXTRACTION W/ INTRAOCULAR LENS IMPLANT Left 5/20//2013  . CHOLECYSTECTOMY, LAPAROSCOPIC  2003  . colonic polyp removal  1965  . COLONOSCOPY   08/29/2006   hemorrhoids, 3 small polyps (hyperplastic) & diverticulosis  . HEMORRHOID SURGERY  1953    Allergies  Allergen Reactions  . Lyrica [Pregabalin]     *Anticonvulsants*, fatique    Allergies as of 09/24/2016      Reactions   Lyrica [pregabalin]    *Anticonvulsants*, fatique      Medication List       Accurate as of 09/24/16 11:35 AM. Always use your most recent med list.          acetaminophen 325 MG tablet Commonly known as:  TYLENOL Take 325 mg by mouth. Take 2 tablets daily; take 2 every 4 hours as needed for pain   BOOST PLUS PO Take 1 Can by mouth 3 (three) times daily. One can three times a day with meals   carbidopa-levodopa 50-200 MG tablet Commonly known as:  SINEMET CR Take 1 tablet by mouth 3 (three) times daily.   cyanocobalamin 1000 MCG tablet Take 1,000 mcg by mouth daily.   cyclobenzaprine 5 MG tablet Commonly known as:  FLEXERIL Take 1 tablet (5 mg total) by mouth 3 (three) times daily as needed for muscle spasms (muscle pain and stiffness).   DULoxetine 20 MG capsule Commonly known as:  CYMBALTA Take 20 mg by mouth daily.   famotidine 20 MG tablet Commonly known as:  PEPCID Take 20 mg by mouth. Take one tablet at bedtime   feeding supplement (PRO-STAT SUGAR FREE 64) Liqd Take 30 mLs by mouth 2 (two) times daily. 30 ml twice daily to promote wound healing   fludrocortisone 0.1 MG tablet Commonly known as:  FLORINEF Take once a day. Hold for BP greater than 160/90   ibuprofen 400 MG tablet Commonly known as:  ADVIL,MOTRIN Take 1 tablet (400 mg total) by mouth every 6 (six) hours as needed for moderate pain.   lactulose 10 GM/15ML solution Commonly known as:  CHRONULAC Take 40 g by mouth daily as needed for mild constipation. Take 60 ml as needed for constipation   megestrol 400 MG/10ML suspension Commonly known as:  MEGACE Take by mouth. Give 200 mg 5 ml once a day   polyethylene glycol packet Commonly known as:  MIRALAX /  GLYCOLAX Take 17 g by mouth daily as needed for mild constipation.   potassium chloride SA 20 MEQ tablet Commonly known as:  K-DUR,KLOR-CON Take 1 tablet (20 mEq total) by mouth daily.   psyllium 58.6 % powder Commonly known as:  METAMUCIL Take 1 packet by mouth daily. One tablespoon in 4 oz liquid daily   THERA-M Tabs TAKE ONE TABLET BY MOUTH EVERY DAY (FORMULARY SUB. FOR DAILY VITE)   zinc oxide 20 % ointment Apply 1 application topically 2 (two) times daily as needed for irritation.       Review of Systems  Constitutional: Positive for activity change and fatigue. Negative for chills, diaphoresis and fever.  HENT: Negative for congestion, ear pain and hearing loss.   Eyes: Negative for photophobia, pain, discharge and redness.  Respiratory: Positive for shortness of breath. Negative for cough and wheezing.   Cardiovascular: Positive for leg swelling. Negative for chest pain and  palpitations.       BLE edema Postural drops in BP. Very high BP at times. Hx CHF  Gastrointestinal: Positive for constipation. Negative for abdominal pain, blood in stool, diarrhea and nausea.  Endocrine:       B12 deficiency on oral supplements  Genitourinary: Negative.   Musculoskeletal: Positive for arthralgias. Negative for back pain, myalgias and neck pain.  Skin:       Multiple seborrheic keratoses.   Neurological: Positive for tremors and weakness. Negative for seizures and headaches.       Complaint of loss of motor skills and changes in. He is unstable with walking. He has a known peripheral neuropathy of uncertain origin. Micrographia. Pressured, soft speech that is nearly inaudible. History of parkinsonism currently treated with Sinemet.  Hematological:       History of anemia  Psychiatric/Behavioral: The patient is nervous/anxious.     Immunization History  Administered Date(s) Administered  . DTaP 09/01/2007  . Influenza-Unspecified 07/04/2014, 05/19/2015, 06/01/2016  .  Pneumococcal Polysaccharide-23 06/11/2002   Pertinent  Health Maintenance Due  Topic Date Due  . PNA vac Low Risk Adult (2 of 2 - PCV13) 06/12/2003  . INFLUENZA VACCINE  Completed   Fall Risk  04/03/2016 02/14/2016 01/24/2016 01/24/2016 10/25/2015  Falls in the past year? No No Yes No No  Number falls in past yr: - - 2 or more - -  Injury with Fall? - - No - -  Risk Factor Category  - - - - -  Risk for fall due to : - - - - -  Risk for fall due to (comments): - - - - -  Follow up - - - - -    Vitals:   09/24/16 1108  BP: 122/70  Pulse: 83  Resp: 20  Temp: 98.4 F (36.9 C)  SpO2: 98%  Weight: 142 lb 12.8 oz (64.8 kg)  Height: 5\' 10"  (1.778 m)   Body mass index is 20.49 kg/m.  This SmartLink has not been configured with any valid records.    Wt Readings from Last 3 Encounters:  09/24/16 142 lb 12.8 oz (64.8 kg)  08/21/16 142 lb 9.6 oz (64.7 kg)  08/02/16 151 lb (68.5 kg)    Physical Exam  Constitutional: He is oriented to person, place, and time.  Thin. Elderly. Chronically weaka nd tired.  HENT:  Right Ear: External ear normal.  Left Ear: External ear normal.  Nose: Nose normal.  Mouth/Throat: Oropharynx is clear and moist. No oropharyngeal exudate.  Eyes: Conjunctivae are normal. Pupils are equal, round, and reactive to light.  Neck: No JVD present. No tracheal deviation present. No thyromegaly present.  Cardiovascular: Normal rate, regular rhythm, normal heart sounds and intact distal pulses.  Exam reveals no gallop and no friction rub.   No murmur heard. Pulmonary/Chest: No respiratory distress. He has no wheezes. He has no rales. He exhibits no tenderness.  Abdominal: He exhibits no distension and no mass. There is no tenderness.  Musculoskeletal: Normal range of motion. He exhibits edema (1+ ipedal) and tenderness.  unstable gait. Trace edema BLE. Pain with movement of the left hip and thigh  Lymphadenopathy:    He has no cervical adenopathy.  Neurological: He is  alert and oriented to person, place, and time. He displays abnormal reflex (Absent at both knees). No cranial nerve deficit. Coordination normal.  Cogwheeling most evident in the right wrist. Gait is mildly abnormal with a suggestion of a festinating gait. Speech is soft and  mildly pressured. There is a mild tremor at rest. There is no focal weakness. 1/14//15 MMSE 30/30. Passed clock drawing test.  Skin: No rash noted. No erythema. No pallor.  Healed corn at the plantar aspect of the the left 5th MTJ  Psychiatric:  Patient appears mildly anxious.     Labs reviewed:  Recent Labs  04/19/16 0434  05/08/16 1653  05/24/16 1500  06/04/16 07/05/16 1015 08/23/16  NA 141  < > 141  < > 139  < > 142 138 138  K 4.1  < > 3.6  < > 3.6  --  3.6 3.3* 3.4  CL 111  < > 106  --  106  --   --  106  --   CO2 22  < > 22  --  25  --   --  28  --   GLUCOSE 96  < > 153*  --  141*  --   --  92  --   BUN 26*  < > 33*  < > 27*  < > 17 21 21   CREATININE 1.16  < > 1.03  < > 0.95  < > 1.0 1.04 1.0  CALCIUM 9.0  < > 8.2*  --  8.0*  --   --  9.0  --   MG 2.1  --   --   --   --   --   --   --   --   < > = values in this interval not displayed.  Recent Labs  04/19/16 0434 05/08/16 1653 05/15/16 05/24/16 1500 08/23/16  AST 23 27 22 29 14   ALT 19 30 29 10  6*  ALKPHOS 43 39 27 39 26  BILITOT 0.4 0.3  --  0.5  --   PROT 5.9* 5.2*  --  5.6*  --   ALBUMIN 3.3* 3.2*  --  3.0*  --     Recent Labs  04/22/16 0523 04/23/16 0357 04/24/16 0654 04/26/16 0400 05/15/16 06/25/16 08/23/16  WBC 9.4 9.3 10.0 9.1 6.4 9.1 9.0  NEUTROABS 7.5 7.2 8.0*  --   --   --   --   HGB 10.2* 10.4* 10.7* 9.8* 93.0* 10.2* 11.0*  HCT 31.5* 31.0* 32.6* 30.2* 28* 30* 33*  MCV 95.2 94.2 94.8 95.3  --   --   --   PLT 161 164 149* 156 167 179 162   Lab Results  Component Value Date   TSH 2.297 04/24/2016   No results found for: HGBA1C Lab Results  Component Value Date   CHOL 166 05/24/2014   HDL 45 05/24/2014   LDLCALC 110  05/24/2014   TRIG 54 05/24/2014    Assessment/Plan 1. Congestive heart failure, unspecified congestive heart failure chronicity, unspecified congestive heart failure type ([facility]) -increase furosemide to 40 mg qd  2. Edema, unspecified type -increase furosemide  3. Parkinson's disease ([place]) The current medical regimen is effective;  continue present plan and medications.  4. Weight loss Stable for the last month  5. Malaise and fatigue Uncertain etiology: CHF vs B12 def vs relation to parkinsonism vs aging vs OSA  6. B12 deficiency -check level

## 2016-09-24 NOTE — Telephone Encounter (Signed)
Dr Willis- please advise 

## 2016-09-28 ENCOUNTER — Other Ambulatory Visit: Payer: Self-pay | Admitting: Internal Medicine

## 2016-09-28 ENCOUNTER — Encounter: Payer: Self-pay | Admitting: Internal Medicine

## 2016-09-28 ENCOUNTER — Telehealth: Payer: Self-pay | Admitting: *Deleted

## 2016-09-28 DIAGNOSIS — I509 Heart failure, unspecified: Secondary | ICD-10-CM

## 2016-09-28 DIAGNOSIS — R5383 Other fatigue: Secondary | ICD-10-CM

## 2016-09-28 NOTE — Telephone Encounter (Signed)
Patient son, Lawrence Turner called and stated that he just learned that his dad's BNP was high and that it might explained his total exhaustion. Son wants this checked out by a Cardiologist ASAP, son is requesting Dr. Einar Gip. He also stated that he is going to have this rechecked on 2/12. Please Advise.

## 2016-09-28 NOTE — Telephone Encounter (Signed)
Dr. Nyoka Cowden placed order. Son is aware

## 2016-09-28 NOTE — Telephone Encounter (Signed)
Schedule appt with Dr. Einar Gip and notify his son.

## 2016-10-01 DIAGNOSIS — I10 Essential (primary) hypertension: Secondary | ICD-10-CM | POA: Diagnosis not present

## 2016-10-01 DIAGNOSIS — D518 Other vitamin B12 deficiency anemias: Secondary | ICD-10-CM | POA: Diagnosis not present

## 2016-10-02 ENCOUNTER — Encounter: Payer: Self-pay | Admitting: Nurse Practitioner

## 2016-10-02 DIAGNOSIS — E876 Hypokalemia: Secondary | ICD-10-CM | POA: Insufficient documentation

## 2016-10-04 DIAGNOSIS — E876 Hypokalemia: Secondary | ICD-10-CM | POA: Diagnosis not present

## 2016-10-04 LAB — BASIC METABOLIC PANEL
BUN: 26 mg/dL — AB (ref 4–21)
CREATININE: 1 mg/dL (ref <=1.3)
Glucose: 88 mg/dL
POTASSIUM: 3.4 mmol/L (ref 3.4–5.3)
SODIUM: 142 mmol/L (ref 137–147)

## 2016-10-05 ENCOUNTER — Other Ambulatory Visit: Payer: Self-pay | Admitting: *Deleted

## 2016-10-07 ENCOUNTER — Emergency Department (HOSPITAL_COMMUNITY): Payer: Medicare Other

## 2016-10-07 ENCOUNTER — Inpatient Hospital Stay (HOSPITAL_COMMUNITY)
Admission: EM | Admit: 2016-10-07 | Discharge: 2016-10-12 | DRG: 056 | Disposition: A | Discharge status: Home / Self Care | Payer: Medicare Other | Attending: Internal Medicine | Admitting: Internal Medicine

## 2016-10-07 ENCOUNTER — Encounter (HOSPITAL_COMMUNITY): Payer: Self-pay | Admitting: Emergency Medicine

## 2016-10-07 DIAGNOSIS — N183 Chronic kidney disease, stage 3 (moderate): Secondary | ICD-10-CM | POA: Diagnosis present

## 2016-10-07 DIAGNOSIS — L89152 Pressure ulcer of sacral region, stage 2: Secondary | ICD-10-CM

## 2016-10-07 DIAGNOSIS — E2749 Other adrenocortical insufficiency: Secondary | ICD-10-CM | POA: Diagnosis not present

## 2016-10-07 DIAGNOSIS — Z9049 Acquired absence of other specified parts of digestive tract: Secondary | ICD-10-CM

## 2016-10-07 DIAGNOSIS — Z961 Presence of intraocular lens: Secondary | ICD-10-CM | POA: Diagnosis present

## 2016-10-07 DIAGNOSIS — R4701 Aphasia: Secondary | ICD-10-CM | POA: Diagnosis not present

## 2016-10-07 DIAGNOSIS — I5033 Acute on chronic diastolic (congestive) heart failure: Secondary | ICD-10-CM | POA: Diagnosis not present

## 2016-10-07 DIAGNOSIS — Z7952 Long term (current) use of systemic steroids: Secondary | ICD-10-CM

## 2016-10-07 DIAGNOSIS — R911 Solitary pulmonary nodule: Secondary | ICD-10-CM

## 2016-10-07 DIAGNOSIS — E860 Dehydration: Secondary | ICD-10-CM | POA: Diagnosis present

## 2016-10-07 DIAGNOSIS — Z515 Encounter for palliative care: Secondary | ICD-10-CM

## 2016-10-07 DIAGNOSIS — I160 Hypertensive urgency: Secondary | ICD-10-CM | POA: Diagnosis not present

## 2016-10-07 DIAGNOSIS — K219 Gastro-esophageal reflux disease without esophagitis: Secondary | ICD-10-CM | POA: Diagnosis present

## 2016-10-07 DIAGNOSIS — I951 Orthostatic hypotension: Secondary | ICD-10-CM | POA: Diagnosis not present

## 2016-10-07 DIAGNOSIS — K224 Dyskinesia of esophagus: Secondary | ICD-10-CM | POA: Diagnosis present

## 2016-10-07 DIAGNOSIS — E778 Other disorders of glycoprotein metabolism: Secondary | ICD-10-CM | POA: Diagnosis present

## 2016-10-07 DIAGNOSIS — E440 Moderate protein-calorie malnutrition: Secondary | ICD-10-CM | POA: Diagnosis not present

## 2016-10-07 DIAGNOSIS — R748 Abnormal levels of other serum enzymes: Secondary | ICD-10-CM | POA: Diagnosis not present

## 2016-10-07 DIAGNOSIS — Z809 Family history of malignant neoplasm, unspecified: Secondary | ICD-10-CM

## 2016-10-07 DIAGNOSIS — G20 Parkinson's disease: Principal | ICD-10-CM | POA: Diagnosis present

## 2016-10-07 DIAGNOSIS — Z66 Do not resuscitate: Secondary | ICD-10-CM | POA: Diagnosis present

## 2016-10-07 DIAGNOSIS — R55 Syncope and collapse: Secondary | ICD-10-CM | POA: Diagnosis not present

## 2016-10-07 DIAGNOSIS — D72829 Elevated white blood cell count, unspecified: Secondary | ICD-10-CM | POA: Diagnosis present

## 2016-10-07 DIAGNOSIS — I429 Cardiomyopathy, unspecified: Secondary | ICD-10-CM | POA: Diagnosis present

## 2016-10-07 DIAGNOSIS — R531 Weakness: Secondary | ICD-10-CM | POA: Diagnosis not present

## 2016-10-07 DIAGNOSIS — R7989 Other specified abnormal findings of blood chemistry: Secondary | ICD-10-CM | POA: Diagnosis not present

## 2016-10-07 DIAGNOSIS — N179 Acute kidney failure, unspecified: Secondary | ICD-10-CM | POA: Diagnosis present

## 2016-10-07 DIAGNOSIS — I272 Pulmonary hypertension, unspecified: Secondary | ICD-10-CM | POA: Diagnosis present

## 2016-10-07 DIAGNOSIS — Z86711 Personal history of pulmonary embolism: Secondary | ICD-10-CM

## 2016-10-07 DIAGNOSIS — Z7189 Other specified counseling: Secondary | ICD-10-CM

## 2016-10-07 DIAGNOSIS — G909 Disorder of the autonomic nervous system, unspecified: Secondary | ICD-10-CM | POA: Diagnosis present

## 2016-10-07 DIAGNOSIS — I10 Essential (primary) hypertension: Secondary | ICD-10-CM | POA: Diagnosis not present

## 2016-10-07 DIAGNOSIS — R0989 Other specified symptoms and signs involving the circulatory and respiratory systems: Secondary | ICD-10-CM | POA: Diagnosis present

## 2016-10-07 DIAGNOSIS — L8992 Pressure ulcer of unspecified site, stage 2: Secondary | ICD-10-CM | POA: Diagnosis present

## 2016-10-07 DIAGNOSIS — Z87891 Personal history of nicotine dependence: Secondary | ICD-10-CM | POA: Diagnosis not present

## 2016-10-07 DIAGNOSIS — D649 Anemia, unspecified: Secondary | ICD-10-CM | POA: Diagnosis not present

## 2016-10-07 DIAGNOSIS — Z9889 Other specified postprocedural states: Secondary | ICD-10-CM

## 2016-10-07 DIAGNOSIS — K5900 Constipation, unspecified: Secondary | ICD-10-CM | POA: Diagnosis present

## 2016-10-07 DIAGNOSIS — I130 Hypertensive heart and chronic kidney disease with heart failure and stage 1 through stage 4 chronic kidney disease, or unspecified chronic kidney disease: Secondary | ICD-10-CM | POA: Diagnosis present

## 2016-10-07 DIAGNOSIS — Z888 Allergy status to other drugs, medicaments and biological substances status: Secondary | ICD-10-CM

## 2016-10-07 DIAGNOSIS — R404 Transient alteration of awareness: Secondary | ICD-10-CM | POA: Diagnosis present

## 2016-10-07 DIAGNOSIS — R778 Other specified abnormalities of plasma proteins: Secondary | ICD-10-CM

## 2016-10-07 DIAGNOSIS — Z86010 Personal history of colonic polyps: Secondary | ICD-10-CM

## 2016-10-07 DIAGNOSIS — R569 Unspecified convulsions: Secondary | ICD-10-CM

## 2016-10-07 DIAGNOSIS — I248 Other forms of acute ischemic heart disease: Secondary | ICD-10-CM | POA: Diagnosis present

## 2016-10-07 DIAGNOSIS — Z9842 Cataract extraction status, left eye: Secondary | ICD-10-CM

## 2016-10-07 DIAGNOSIS — Z79899 Other long term (current) drug therapy: Secondary | ICD-10-CM

## 2016-10-07 LAB — COMPREHENSIVE METABOLIC PANEL
ALT: 23 U/L (ref 17–63)
AST: 30 U/L (ref 15–41)
Albumin: 2.9 g/dL — ABNORMAL LOW (ref 3.5–5.0)
Alkaline Phosphatase: 37 U/L — ABNORMAL LOW (ref 38–126)
Anion gap: 11 (ref 5–15)
BUN: 34 mg/dL — AB (ref 6–20)
CO2: 22 mmol/L (ref 22–32)
CREATININE: 1.31 mg/dL — AB (ref 0.61–1.24)
Calcium: 8.3 mg/dL — ABNORMAL LOW (ref 8.9–10.3)
Chloride: 105 mmol/L (ref 101–111)
GFR calc Af Amer: 54 mL/min — ABNORMAL LOW (ref >60)
GFR calc non Af Amer: 47 mL/min — ABNORMAL LOW (ref >60)
GLUCOSE: 121 mg/dL — AB (ref 65–99)
Potassium: 3.9 mmol/L (ref 3.5–5.1)
SODIUM: 138 mmol/L (ref 135–145)
Total Bilirubin: 1.2 mg/dL (ref 0.3–1.2)
Total Protein: 5.1 g/dL — ABNORMAL LOW (ref 6.5–8.1)

## 2016-10-07 LAB — PROTIME-INR
INR: 1.13
Prothrombin Time: 14.6 seconds (ref 11.4–15.2)

## 2016-10-07 LAB — URINALYSIS, ROUTINE W REFLEX MICROSCOPIC
Bilirubin Urine: NEGATIVE
Glucose, UA: NEGATIVE mg/dL
Hgb urine dipstick: NEGATIVE
KETONES UR: NEGATIVE mg/dL
LEUKOCYTES UA: NEGATIVE
Nitrite: NEGATIVE
PH: 6 (ref 5.0–8.0)
Protein, ur: NEGATIVE mg/dL
SPECIFIC GRAVITY, URINE: 1.009 (ref 1.005–1.030)

## 2016-10-07 LAB — CBC
HCT: 34.3 % — ABNORMAL LOW (ref 39.0–52.0)
Hemoglobin: 11.3 g/dL — ABNORMAL LOW (ref 13.0–17.0)
MCH: 32.1 pg (ref 26.0–34.0)
MCHC: 32.9 g/dL (ref 30.0–36.0)
MCV: 97.4 fL (ref 78.0–100.0)
PLATELETS: 163 10*3/uL (ref 150–400)
RBC: 3.52 MIL/uL — AB (ref 4.22–5.81)
RDW: 15.8 % — ABNORMAL HIGH (ref 11.5–15.5)
WBC: 12.5 10*3/uL — AB (ref 4.0–10.5)

## 2016-10-07 LAB — TROPONIN I
TROPONIN I: 0.06 ng/mL — AB (ref <0.03)
TROPONIN I: 0.08 ng/mL — AB (ref <0.03)

## 2016-10-07 LAB — TSH: TSH: 0.918 u[IU]/mL (ref 0.350–4.500)

## 2016-10-07 LAB — APTT: aPTT: 20 seconds — ABNORMAL LOW (ref 24–36)

## 2016-10-07 LAB — BRAIN NATRIURETIC PEPTIDE: B Natriuretic Peptide: 481.2 pg/mL — ABNORMAL HIGH (ref 0.0–100.0)

## 2016-10-07 MED ORDER — LABETALOL HCL 5 MG/ML IV SOLN: 5.0000 mg | INTRAVENOUS | Status: DC | PRN

## 2016-10-07 MED ORDER — PSYLLIUM 95 % PO PACK
1.0000 | PACK | Freq: Every day | ORAL | Status: DC
  Administered 2016-10-08 – 2016-10-12 (×5): 1 via ORAL
  Filled 2016-10-07 (×5): qty 1

## 2016-10-07 MED ORDER — DULOXETINE HCL 30 MG PO CPEP
30.0000 mg | ORAL_CAPSULE | Freq: Every day | ORAL | Status: DC
  Administered 2016-10-08 – 2016-10-12 (×5): 30 mg via ORAL
  Filled 2016-10-07 (×5): qty 1

## 2016-10-07 MED ORDER — SODIUM CHLORIDE 0.9 % IV SOLN
Freq: Once | INTRAVENOUS | Status: AC
  Administered 2016-10-07: 16:00:00 via INTRAVENOUS

## 2016-10-07 MED ORDER — SODIUM CHLORIDE 0.9 % IV BOLUS (SEPSIS)
500.0000 mL | Freq: Once | INTRAVENOUS | Status: AC
  Administered 2016-10-07: 500 mL via INTRAVENOUS

## 2016-10-07 MED ORDER — LABETALOL HCL 5 MG/ML IV SOLN
5.0000 mg | INTRAVENOUS | Status: DC | PRN
  Administered 2016-10-07: 5 mg via INTRAVENOUS
  Filled 2016-10-07 (×2): qty 4

## 2016-10-07 MED ORDER — ASPIRIN 81 MG PO CHEW
324.0000 mg | CHEWABLE_TABLET | Freq: Once | ORAL | Status: AC
  Administered 2016-10-07: 324 mg via ORAL
  Filled 2016-10-07: qty 4

## 2016-10-07 MED ORDER — ONDANSETRON HCL 4 MG/2ML IJ SOLN: 4.0000 mg | Freq: Four times a day (QID) | INTRAMUSCULAR | Status: DC | PRN

## 2016-10-07 MED ORDER — HYDROCORTISONE 10 MG PO TABS
10.0000 mg | ORAL_TABLET | Freq: Every day | ORAL | Status: DC
  Administered 2016-10-08 – 2016-10-11 (×5): 10 mg via ORAL
  Filled 2016-10-07 (×5): qty 1

## 2016-10-07 MED ORDER — FLUDROCORTISONE ACETATE 0.1 MG PO TABS
0.1000 mg | ORAL_TABLET | Freq: Every day | ORAL | Status: DC
  Administered 2016-10-08: 0.1 mg via ORAL
  Filled 2016-10-07: qty 1

## 2016-10-07 MED ORDER — PRO-STAT SUGAR FREE PO LIQD
30.0000 mL | Freq: Three times a day (TID) | ORAL | Status: DC
  Administered 2016-10-08 – 2016-10-12 (×10): 30 mL via ORAL
  Filled 2016-10-07 (×9): qty 30

## 2016-10-07 MED ORDER — ONDANSETRON HCL 4 MG PO TABS: 4.0000 mg | ORAL_TABLET | Freq: Four times a day (QID) | ORAL | Status: DC | PRN

## 2016-10-07 MED ORDER — CYCLOBENZAPRINE HCL 10 MG PO TABS: 5.0000 mg | ORAL_TABLET | Freq: Three times a day (TID) | ORAL | Status: DC | PRN

## 2016-10-07 MED ORDER — LABETALOL HCL 5 MG/ML IV SOLN
10.0000 mg | Freq: Once | INTRAVENOUS | Status: AC
  Administered 2016-10-07: 10 mg via INTRAVENOUS
  Filled 2016-10-07: qty 4

## 2016-10-07 MED ORDER — LACTULOSE 10 GM/15ML PO SOLN: 40.0000 g | Freq: Every day | ORAL | Status: DC | PRN

## 2016-10-07 MED ORDER — ENOXAPARIN SODIUM 40 MG/0.4ML ~~LOC~~ SOLN
40.0000 mg | SUBCUTANEOUS | Status: DC
  Administered 2016-10-08 – 2016-10-11 (×5): 40 mg via SUBCUTANEOUS
  Filled 2016-10-07 (×5): qty 0.4

## 2016-10-07 MED ORDER — VITAMIN B-12 1000 MCG PO TABS
1000.0000 ug | ORAL_TABLET | Freq: Every day | ORAL | Status: DC
  Administered 2016-10-08 – 2016-10-12 (×5): 1000 ug via ORAL
  Filled 2016-10-07 (×5): qty 1

## 2016-10-07 MED ORDER — FAMOTIDINE 20 MG PO TABS
20.0000 mg | ORAL_TABLET | Freq: Every day | ORAL | Status: DC
  Administered 2016-10-08 – 2016-10-12 (×5): 20 mg via ORAL
  Filled 2016-10-07 (×5): qty 1

## 2016-10-07 MED ORDER — ZINC OXIDE 20 % EX OINT
1.0000 "application " | TOPICAL_OINTMENT | Freq: Two times a day (BID) | CUTANEOUS | Status: DC | PRN
  Filled 2016-10-07: qty 28.35

## 2016-10-07 MED ORDER — MEGESTROL ACETATE 400 MG/10ML PO SUSP
200.0000 mg | Freq: Every day | ORAL | Status: DC
  Administered 2016-10-08 – 2016-10-12 (×5): 200 mg via ORAL
  Filled 2016-10-07 (×6): qty 5

## 2016-10-07 MED ORDER — HYDROCORTISONE 10 MG PO TABS
20.0000 mg | ORAL_TABLET | Freq: Every day | ORAL | Status: DC
  Administered 2016-10-08 – 2016-10-12 (×5): 20 mg via ORAL
  Filled 2016-10-07 (×5): qty 2

## 2016-10-07 MED ORDER — DEXAMETHASONE SODIUM PHOSPHATE 10 MG/ML IJ SOLN
10.0000 mg | Freq: Once | INTRAMUSCULAR | Status: AC
  Administered 2016-10-07: 10 mg via INTRAVENOUS
  Filled 2016-10-07: qty 1

## 2016-10-07 MED ORDER — ACETAMINOPHEN 325 MG PO TABS
650.0000 mg | ORAL_TABLET | Freq: Four times a day (QID) | ORAL | Status: DC | PRN
  Administered 2016-10-12: 650 mg via ORAL
  Filled 2016-10-07: qty 2

## 2016-10-07 MED ORDER — SODIUM CHLORIDE 0.9 % IV SOLN
INTRAVENOUS | Status: AC
  Administered 2016-10-07 – 2016-10-08 (×2): via INTRAVENOUS

## 2016-10-07 MED ORDER — CARBIDOPA-LEVODOPA ER 50-200 MG PO TBCR
1.0000 | EXTENDED_RELEASE_TABLET | Freq: Three times a day (TID) | ORAL | Status: DC
  Administered 2016-10-08 – 2016-10-12 (×14): 1 via ORAL
  Filled 2016-10-07 (×15): qty 1

## 2016-10-07 MED ORDER — HYDROCODONE-ACETAMINOPHEN 5-325 MG PO TABS: 1.0000 | ORAL_TABLET | ORAL | Status: DC | PRN

## 2016-10-07 MED ORDER — POLYETHYLENE GLYCOL 3350 17 G PO PACK
17.0000 g | PACK | Freq: Every day | ORAL | Status: DC | PRN
Start: 2016-10-07 — End: 2016-10-10

## 2016-10-07 MED ORDER — SODIUM CHLORIDE 0.9% FLUSH
3.0000 mL | Freq: Two times a day (BID) | INTRAVENOUS | Status: DC
  Administered 2016-10-08 – 2016-10-12 (×9): 3 mL via INTRAVENOUS

## 2016-10-07 NOTE — ED Notes (Signed)
Lab called and said trop needed to be reordered.  Sample was not enough to run lab.

## 2016-10-07 NOTE — ED Notes (Signed)
Placed foam pad on sacral pressure injury

## 2016-10-07 NOTE — ED Notes (Signed)
Pt was not able to stand for ortho vitals.

## 2016-10-07 NOTE — ED Provider Notes (Signed)
Patient became transiently less responsive this morning. He has no recall of the event. He is presently asymptomatic except feels "washed out and tired". On exam chronically ill-appearing HEENT exam no facial asymmetry  mucous members dry neck supple lungs clear auscultation    Orlie Dakin, MD 10/07/16 1757

## 2016-10-07 NOTE — ED Notes (Signed)
Pt was able to stand with assistance from the RN and the CNA.  Pt said he felt like he was slipping but once he was standing he only need minimal assistance to keep standing.  Pt was c/o being light headed so he was assisted back into bed.

## 2016-10-07 NOTE — ED Notes (Signed)
Family at bedside. 

## 2016-10-07 NOTE — H&P (Signed)
History and Physical    Angle [name] DOB: Dec 30, 1925 DOA: 10/07/2016  PCP: Jeanmarie Hubert, MD   Patient coming from: Nursing home  Chief Complaint: Transient episode of poor responsiveness, labile BP   HPI: Lawrence Turner is a 83 y.o. male with medical history significant for Parkinson's disease, adrenal insufficiency, questionable seizure history, GERD, and chronic constipation who presents from his nursing facility for evaluation of a transient altered level of consciousness. Patient has been following with endocrinology for adrenal insufficiency, and with neurology for Parkinson's disease, and his course has been complicated by apparent autonomic dysfunction and labile blood pressures. He has had multiple episodes involving transient loss of consciousness and today at the nursing facility, had an episode where he was not responding to staff, but with eyes apparently open and gazing straight ahead. There was no convulsive activity noted and after several seconds, the patient was able to squeeze a nurse's hand on command. He returned to his usual state within seconds, but reported feeling very fatigued. He denies any chest pain or palpitations and denies headache, change in vision or hearing, or focal numbness or weakness. There is been no recent fall or head trauma and no recent fevers or chills. Patient had a BNP drawn 2 weeks ago at the nursing home, it returned elevated, and he had Lasix increased. His corticosteroid was recently increased by the endocrinologist and, per the visit note, addition of midodrine was being considered.   ED Course: Upon arrival to the ED, patient is found to be afebrile, saturating well on room air, hypertensive to 190/100, and with normal heart rate of respirations. There was a 48 mmHg drop in systolic blood pressure from lying to sitting and the patient was unable to stand up due to lightheadedness. EKG features a sinus rhythm with LVH by voltage criteria and  secondary repolarization abnormality. Chest x-ray was negative for acute cardiopulmonary disease, but notable for questionable 8 mm nodule in the right midlung. Noncontrast head CT is negative for acute intracranial abnormality. Chemistry panels notable for a BUN of 34 and serum creatinine 1.31, up from an apparent baseline of 1.0. CBC features a mild leukocytosis to 12,500 and a stable normocytic anemia with hemoglobin of 11.3. INR is within the normal limits, troponin is mildly elevated to 0.06 and BNP is elevated to 481. Urinalysis is unremarkable. ED provider discussed the case with cardiology who advised a medical admission. Patient was treated with 324 mg aspirin, 10 mg Decadron, 1 IV push of 10 mg labetalol, and 500 mL normal saline. Patient remained hemodynamically stable while at rest in the hospital bed and has not been in any acute respiratory distress. He will be observed on the telemetry unit for ongoing evaluation and management of a transient alteration in consciousness, likely secondary to transient hypotension.  Review of Systems:  All other systems reviewed and apart from HPI, are negative.  Past Medical History:  Diagnosis Date  . Abnormality of gait 10/19/2015  . Anemia, unspecified   . Benign neoplasm of colon    pre-cancerous polyps removed at colonoscopy 2008  . Contact dermatitis and other eczema, due to unspecified cause   . Diverticulosis of colon (without mention of hemorrhage)   . Esophageal reflux   . Hypertrophy of prostate without urinary obstruction and other lower urinary tract symptoms (LUTS)   . Mononeuritis of unspecified site   . Orthostatic hypotension 04/18/2016  . Osteoarthrosis, unspecified whether generalized or localized, pelvic region and thigh   . Other B-complex  deficiencies   . Other pulmonary embolism and infarction    following cholecystectomy 2003  . Parkinson disease ([place]) 10/19/2015  . Peripheral neuropathy ([place])   . Pure hypercholesterolemia   .  Rosacea   . Seizures ([place]) 04/17/2016  . 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 Syncope and collapse 04/18/2016  . Ulcer of foot ([place]) 04/03/2016  . Unspecified constipation   . Unspecified essential hypertension   . Unspecified gastritis and gastroduodenitis without mention of hemorrhage 08/29/2006  . Unspecified hemorrhoids without mention of complication   . Unspecified vitamin D deficiency     Past Surgical History:  Procedure Laterality Date  . CATARACT EXTRACTION W/ INTRAOCULAR LENS IMPLANT Left 5/20//2013  . CHOLECYSTECTOMY, LAPAROSCOPIC  2003  . colonic polyp removal  1965  . COLONOSCOPY  08/29/2006   hemorrhoids, 3 small polyps (hyperplastic) & diverticulosis  . [place]     reports that he quit smoking about 53 years ago. His smoking use included Cigarettes. He quit after 15.00 years of use. He has never used smokeless tobacco. He reports that he drinks about 1.2 oz of alcohol per week . He reports that he does not use drugs.  Allergies  Allergen Reactions  . Lyrica [Pregabalin]     *Anticonvulsants*, fatique    Family History  Problem Relation Age of Onset  . Cancer Mother     breast  . Pneumonia Father   . Cancer Brother      Prior to Admission medications   Medication Sig Start Date End Date Taking? Authorizing Provider  acetaminophen (TYLENOL) 325 MG tablet Take 325 mg by mouth. Take 2 tablets daily; take 2 every 4 hours as needed for pain 05/01/16  Yes Historical Provider, MD  Amino Acids-Protein Hydrolys (FEEDING SUPPLEMENT, PRO-STAT SUGAR FREE 64,) LIQD Take 30 mLs by mouth 3 (three) times daily with meals.   Yes Historical Provider, MD  carbidopa-levodopa (SINEMET CR) 50-200 MG tablet Take 1 tablet by mouth 3 (three) times daily. 08/07/16  Yes Kathrynn Ducking, MD  cyanocobalamin 1000 MCG tablet Take 1,000 mcg by mouth daily.   Yes Historical Provider, MD  cyclobenzaprine (FLEXERIL) 5 MG tablet Take 1 tablet (5 mg  total) by mouth 3 (three) times daily as needed for muscle spasms (muscle pain and stiffness). 04/30/16  Yes Domenic Polite, MD  DULoxetine (CYMBALTA) 30 MG capsule Take 30 mg by mouth daily.    Yes Historical Provider, MD  famotidine (PEPCID) 20 MG tablet Take 20 mg by mouth daily.   Yes Historical Provider, MD  fludrocortisone (FLORINEF) 0.1 MG tablet Take once a day. Hold for BP greater than 160/90 06/28/16  Yes Historical Provider, MD  furosemide (LASIX) 40 MG tablet Take 40 mg by mouth daily.   Yes Historical Provider, MD  hydrocortisone (CORTEF) 10 MG tablet Take 10 mg by mouth 2 (two) times daily.   Yes Historical Provider, MD  ibuprofen (ADVIL,MOTRIN) 400 MG tablet Take 1 tablet (400 mg total) by mouth every 6 (six) hours as needed for moderate pain. 04/30/16  Yes Domenic Polite, MD  lactulose (CHRONULAC) 10 GM/15ML solution Take 40 g by mouth daily as needed for mild constipation. Take 60 ml as needed for constipation    Yes Historical Provider, MD  megestrol (MEGACE) 400 MG/10ML suspension Take 200 mg by mouth daily.    Yes Historical Provider, MD  Multiple Vitamins-Minerals (THERA-M) TABS TAKE ONE TABLET BY MOUTH EVERY DAY (FORMULARY SUB. FOR DAILY VITE) 05/25/16  Yes Estill Dooms, MD  Nutritional Supplements (BOOST PLUS PO) Take 1 Can by mouth 3 (three) times daily. One can three times a day with meals    Yes Historical Provider, MD  polyethylene glycol (MIRALAX / GLYCOLAX) packet Take 17 g by mouth daily as needed for mild constipation.   Yes Historical Provider, MD  potassium chloride SA (K-DUR,KLOR-CON) 20 MEQ tablet Take 1 tablet (20 mEq total) by mouth daily. Patient taking differently: Take 40 mEq by mouth daily.  07/06/16  Yes Elayne Snare, MD  psyllium (HYDROCIL/METAMUCIL) 95 % PACK Take 1 packet by mouth daily.   Yes Historical Provider, MD  zinc oxide 20 % ointment Apply 1 application topically 2 (two) times daily as needed for irritation.   Yes Historical Provider, MD    Physical  Exam: Vitals:   10/07/16 1745 10/07/16 1815 10/07/16 1845 10/07/16 1900  BP: (!) 208/97 (!) 215/106 (!) 203/102 170/91  Pulse: 84 89 82 78  Resp: 12 15 12 14   Temp:      TempSrc:      SpO2: 99% 100% 100% 99%      Constitutional: NAD, calm, comfortable. Cachectic. Masked facies. Eyes: PERTLA, lids and conjunctivae normal ENMT: Mucous membranes are dry. Posterior pharynx clear of any exudate or lesions.   Neck: normal, supple, no masses, no thyromegaly Respiratory: clear to auscultation bilaterally, no wheezing, no crackles. Normal respiratory effort.  Cardiovascular: S1 & S2 heard, regular rate and rhythm, no significant murmur. No extremity edema. No significant JVD. Abdomen: No distension, no tenderness, no masses palpated. Bowel sounds normal.  Musculoskeletal: no clubbing / cyanosis. No joint deformity upper and lower extremities.    Skin: no significant rashes, lesions, ulcers. Warm, dry, well-perfused. Neurologic: CN 2-12 grossly intact. Sensation intact in distal extremities x4. Patellar DTR's hyporeflexive. Grip strength 5/5 bilaterally.  Psychiatric: Normal judgment and insight. Alert and oriented x 3. Normal mood and affect.     Labs on Admission: I have personally reviewed following labs and imaging studies  CBC:  Recent Labs Lab 10/07/16 1454  WBC 12.5*  HGB 11.3*  HCT 34.3*  MCV 97.4  PLT [id number]   Basic Metabolic Panel:  Recent Labs Lab 10/04/16 10/07/16 1454  NA 142 138  K 3.4 3.9  CL  --  105  CO2  --  22  GLUCOSE  --  121*  BUN 26* 34*  CREATININE 1.0 1.31*  CALCIUM  --  8.3*   GFR: Estimated Creatinine Clearance: 35 mL/min (by C-G formula based on SCr of 1.31 mg/dL (H)). Liver Function Tests:  Recent Labs Lab 10/07/16 1454  AST 30  ALT 23  ALKPHOS 37*  BILITOT 1.2  PROT 5.1*  ALBUMIN 2.9*   No results for input(s): LIPASE, AMYLASE in the last 168 hours. No results for input(s): AMMONIA in the last 168 hours. Coagulation  Profile:  Recent Labs Lab 10/07/16 1454  INR 1.13   Cardiac Enzymes:  Recent Labs Lab 10/07/16 1717  TROPONINI 0.06*   BNP (last 3 results) No results for input(s): PROBNP in the last 8760 hours. HbA1C: No results for input(s): HGBA1C in the last 72 hours. CBG: No results for input(s): GLUCAP in the last 168 hours. Lipid Profile: No results for input(s): CHOL, HDL, LDLCALC, TRIG, CHOLHDL, LDLDIRECT in the last 72 hours. Thyroid Function Tests: No results for input(s): TSH, T4TOTAL, FREET4, T3FREE, THYROIDAB in the last 72 hours. Anemia Panel: No results for input(s): VITAMINB12, FOLATE, FERRITIN, TIBC, IRON, RETICCTPCT in the  last 72 hours. Urine analysis:    Component Value Date/Time   COLORURINE YELLOW 10/07/2016 1438   APPEARANCEUR CLEAR 10/07/2016 1438   LABSPEC 1.009 10/07/2016 1438   PHURINE 6.0 10/07/2016 1438   GLUCOSEU NEGATIVE 10/07/2016 1438   HGBUR NEGATIVE 10/07/2016 1438   [place] 10/07/2016 1438   [place] 10/07/2016 1438   PROTEINUR NEGATIVE 10/07/2016 1438   NITRITE NEGATIVE 10/07/2016 1438   LEUKOCYTESUR NEGATIVE 10/07/2016 1438   Sepsis Labs: @LABRCNTIP (procalcitonin:4,lacticidven:4) )No results found for this or any previous visit (from the past 240 hour(s)).   Radiological Exams on Admission: Ct Head Wo Contrast  Result Date: 10/07/2016 CLINICAL DATA:  Aphasia EXAM: CT HEAD WITHOUT CONTRAST TECHNIQUE: Contiguous axial images were obtained from the base of the skull through the vertex without intravenous contrast. COMPARISON:  None. FINDINGS: Brain: There is no evidence for acute hemorrhage, hydrocephalus, mass lesion, or abnormal extra-axial fluid collection. No definite CT evidence for acute infarction. Diffuse loss of parenchymal volume is consistent with atrophy. Patchy low attenuation in the deep hemispheric and periventricular white matter is nonspecific, but likely reflects chronic microvascular ischemic demyelination.  Vascular: No hyperdense vessel or unexpected calcification. Skull: No evidence for fracture. No worrisome lytic or sclerotic lesion. Sinuses/Orbits: The visualized paranasal sinuses and mastoid air cells are clear. Visualized portions of the globes and intraorbital fat are unremarkable. Other: None. IMPRESSION: 1. No acute intracranial abnormality. 2. Atrophy with chronic small vessel white matter ischemic disease. Electronically Signed   By: Misty Stanley M.D.   On: 10/07/2016 16:57   Dg Chest Port 1 View  Result Date: 10/07/2016 CLINICAL DATA:  Acute onset of aphasia. Loss of consciousness. Chronic lethargy. Initial encounter. EXAM: PORTABLE CHEST 1 VIEW COMPARISON:  Chest radiograph performed 04/18/2016 FINDINGS: The lungs are well-aerated. There is an 8 mm nodular density at the right midlung zone. This may be within the overlying scapula or rib, though no definite similar density was identified on the prior study. There is no evidence of pleural effusion or pneumothorax. The cardiomediastinal silhouette is within normal limits. No acute osseous abnormalities are seen. IMPRESSION: 1. No acute cardiopulmonary process seen. 2. Question of 8 mm nodule at the right midlung zone. CT of the chest would be helpful for further evaluation, when and as deemed clinically appropriate. Electronically Signed   By: Garald Balding M.D.   On: 10/07/2016 17:14    EKG: Independently reviewed. Sinus rhythm, LVH by voltage criteria with secondary repolarization abnormality.   Assessment/Plan  1. Transient ALOC  - Pt presents following a transient episode at his nursing home in which he was not responding, but with eyes open and "staring off;" he returned to usual state within seconds to a minute and had no complaints other than fatigue  - Per his family at bedside, he has had multiple such episodes - Head CT is negative for acute pathology and patient is in his usual state on admission  - BP is very labile and there  is significant orthostasis; suspect transient hypotension as the cause of his episodes, though non-convulsive seizure also possible   - Plan to monitor on telemetry, obtain TTE, check EEG, provide gentle IVF hydration    2. Orthostatic hypotension, labile BP - This has been an ongoing problem, likely related to PD and autonomic dysfunction, with adrenal insufficiency possibly contributing  - Per recent endocrinology notes, steroid was increased and midodrine was being considered  - He was given 10 mg Decadron in ED and is continued on  a gentle IVF hydration  - Check TTE as above, monitor on telemetry    3. Parkinson disease  - Appears to be stable, concerned for autonomic dysfunction as above, continue Sinemet    4. Adrenal insufficiency  - Pt follows with endocrinology, managed with Florinef and Cortef, will continue  - Electrolytes wnl, no N/V or abd pain - He was given 10 mg IV Decadron in ED   5. Acute kidney injury  - SCr is 1.31 on admission, up from apparent baseline of 1.0  - Likely a prerenal azotemia given his clinical dehydration and transient hypotension  - He was given 500 cc NS in ED and is continued on NS infusion  - Hold Lasix and Advil, repeat chemistries in am    6. Elevated troponin  - Troponin slightly elevated on admission to 0.06  - There is no anginal complaint on admission and this likely reflects demand ischemia in setting of transient episodes of hypotension  - Monitor on telemetry for ischemic changes, trend troponin   7. GERD - Stable, no EGD report on file  - Managed with Pepcid at home, will continue   8. Normocytic anemia - Hgb is 11.3 on admission and stable relative to recent priors  - No sign of bleeding, likely secondary to chronic disease  - Continue B12 supplementation   9. Pressure wound, PTA - Pt complains of pain from a sacral pressure wound, present on arrival  - PRN analgesia  - Wound care consultation requested     DVT prophylaxis:  sq Lovenox  Code Status: DNR  Family Communication: Son updated at bedside Disposition Plan: Observe on telemetry Consults called: None Admission status: Observation    Vianne Bulls, MD [facility] Pager [phone]  If 7PM-7AM, please contact night-coverage [email or web address] Password [facility]  10/07/2016, 7:47 PM

## 2016-10-07 NOTE — ED Notes (Signed)
Patient transported to CT 

## 2016-10-07 NOTE — ED Triage Notes (Signed)
Patient presents today from [facility]. Per EMS staff states patient LOC and became aphasia x1 minute. Staff states has had hypertension 190's 200 x2 days. Patient alert and oriented x4 on arrival moving all extremities. NIH on arrival 0. Patient denies any Chest Pain Shortness of breathe. Patient states he has pain on sacrum  due to pressure ulcer.

## 2016-10-07 NOTE — ED Provider Notes (Signed)
[facility] DEPT Provider Note   CSN: [id number] Arrival date & time: 10/07/16  1339     History   Chief Complaint Chief Complaint  Patient presents with  . Weakness    HPI   Blood pressure 179/95, pulse 86, temperature 99.4 F (37.4 C), temperature source Oral, resp. rate 16, SpO2 100 %.  Noel Thormahlen is a 83 y.o. male  brought in for evaluation of 1 minute of a fascia now resolved with weakness in the bilateral thighs. Patient states that he was at the nursing home where he resides, he was trying to communicate but knew he was not finding words and not communicating effectively. There was no change in vision, nausea, vomiting, headache, ataxia. He states that there was bilateral thigh weakness. He states he's been in his normal state of health with standard poor appetite but no nausea, vomiting, change in bowel or bladder habits, prodrome of cough chest pain fever chills. He has shortness of breath at his baseline.  Discussed with nurse at [facility]'s home who was not there for the episode that caused him to present to the emergency room she did receive signout states that he was complaining about his neuropathy in his bilateral feet that sat down and became unresponsive for several minutes he maintained postural tone. The caretaker asked him to squeeze the fingers and he was able to do so, when he came out of the episode of unresponsiveness there was no confusion dysarthria or ataxia.  Past Medical History:  Diagnosis Date  . Abnormality of gait 10/19/2015  . Anemia, unspecified   . Benign neoplasm of colon    pre-cancerous polyps removed at colonoscopy 2008  . Contact dermatitis and other eczema, due to unspecified cause   . Diverticulosis of colon (without mention of hemorrhage)   . Esophageal reflux   . Hypertrophy of prostate without urinary obstruction and other lower urinary tract symptoms (LUTS)   . Mononeuritis of unspecified site   . Orthostatic hypotension 04/18/2016  .  Osteoarthrosis, unspecified whether generalized or localized, pelvic region and thigh   . Other B-complex deficiencies   . Other pulmonary embolism and infarction    following cholecystectomy 2003  . Parkinson disease ([place]) 10/19/2015  . Peripheral neuropathy ([place])   . Pure hypercholesterolemia   . Rosacea   . Seizures ([place]) 04/17/2016  . 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 Syncope and collapse 04/18/2016  . Ulcer of foot ([place]) 04/03/2016  . Unspecified constipation   . Unspecified essential hypertension   . Unspecified gastritis and gastroduodenitis without mention of hemorrhage 08/29/2006  . Unspecified hemorrhoids without mention of complication   . Unspecified vitamin D deficiency     Patient Active Problem List   Diagnosis Date Noted  . Hypertensive urgency 10/07/2016  . Altered awareness, transient 10/07/2016  . Hypokalemia 10/02/2016  . B12 deficiency 09/24/2016  . Malaise and fatigue 08/21/2016  . CHF (congestive heart failure) ([place]) 05/22/2016  . Pressure ulcer, stage 2 04/21/2016  . Secondary adrenal insufficiency ([place]) 04/21/2016  . Syncope 04/19/2016  . Orthostatic hypotension 04/18/2016  . Seizures ([place]) 04/17/2016  . Ulcer of foot ([place]) 04/03/2016  . Weight loss 01/24/2016  . Hammer toe of left foot 10/25/2015  . Abnormality of gait 10/19/2015  . Left hip pain 08/09/2015  . Edema 03/15/2015  . Constipation 10/05/2014  . Corn 10/05/2014  . Parkinson's disease ([place]) 02/02/2014  . Uncontrolled hypertension   . Esophageal reflux   . Peripheral neuropathy (  [place])   . BPH (benign prostatic hyperplasia)   . Other B-complex deficiencies   . Normocytic anemia   . Unspecified vitamin D deficiency   . Pure hypercholesterolemia   . Benign neoplasm of colon     Past Surgical History:  Procedure Laterality Date  . CATARACT EXTRACTION W/ INTRAOCULAR LENS IMPLANT Left 5/20//2013  . CHOLECYSTECTOMY, LAPAROSCOPIC  2003  . colonic polyp removal   1965  . COLONOSCOPY  08/29/2006   hemorrhoids, 3 small polyps (hyperplastic) & diverticulosis  . [place] Medications    Prior to Admission medications   Medication Sig Start Date End Date Taking? Authorizing Provider  acetaminophen (TYLENOL) 325 MG tablet Take 325 mg by mouth. Take 2 tablets daily; take 2 every 4 hours as needed for pain 05/01/16  Yes Historical Provider, MD  Amino Acids-Protein Hydrolys (FEEDING SUPPLEMENT, PRO-STAT SUGAR FREE 64,) LIQD Take 30 mLs by mouth 3 (three) times daily with meals.   Yes Historical Provider, MD  carbidopa-levodopa (SINEMET CR) 50-200 MG tablet Take 1 tablet by mouth 3 (three) times daily. 08/07/16  Yes Kathrynn Ducking, MD  cyanocobalamin 1000 MCG tablet Take 1,000 mcg by mouth daily.   Yes Historical Provider, MD  cyclobenzaprine (FLEXERIL) 5 MG tablet Take 1 tablet (5 mg total) by mouth 3 (three) times daily as needed for muscle spasms (muscle pain and stiffness). 04/30/16  Yes Domenic Polite, MD  DULoxetine (CYMBALTA) 30 MG capsule Take 30 mg by mouth daily.    Yes Historical Provider, MD  famotidine (PEPCID) 20 MG tablet Take 20 mg by mouth daily.   Yes Historical Provider, MD  fludrocortisone (FLORINEF) 0.1 MG tablet Take once a day. Hold for BP greater than 160/90 06/28/16  Yes Historical Provider, MD  furosemide (LASIX) 40 MG tablet Take 40 mg by mouth daily.   Yes Historical Provider, MD  hydrocortisone (CORTEF) 10 MG tablet Take 10 mg by mouth 2 (two) times daily.   Yes Historical Provider, MD  ibuprofen (ADVIL,MOTRIN) 400 MG tablet Take 1 tablet (400 mg total) by mouth every 6 (six) hours as needed for moderate pain. 04/30/16  Yes Domenic Polite, MD  lactulose (CHRONULAC) 10 GM/15ML solution Take 40 g by mouth daily as needed for mild constipation. Take 60 ml as needed for constipation    Yes Historical Provider, MD  megestrol (MEGACE) 400 MG/10ML suspension Take 200 mg by mouth daily.    Yes Historical Provider,  MD  Multiple Vitamins-Minerals (THERA-M) TABS TAKE ONE TABLET BY MOUTH EVERY DAY (FORMULARY SUB. FOR DAILY VITE) 05/25/16  Yes Estill Dooms, MD  Nutritional Supplements (BOOST PLUS PO) Take 1 Can by mouth 3 (three) times daily. One can three times a day with meals    Yes Historical Provider, MD  polyethylene glycol (MIRALAX / GLYCOLAX) packet Take 17 g by mouth daily as needed for mild constipation.   Yes Historical Provider, MD  potassium chloride SA (K-DUR,KLOR-CON) 20 MEQ tablet Take 1 tablet (20 mEq total) by mouth daily. Patient taking differently: Take 40 mEq by mouth daily.  07/06/16  Yes Elayne Snare, MD  psyllium (HYDROCIL/METAMUCIL) 95 % PACK Take 1 packet by mouth daily.   Yes Historical Provider, MD  zinc oxide 20 % ointment Apply 1 application topically 2 (two) times daily as needed for irritation.   Yes Historical Provider, MD    Family History Family History  Problem Relation Age of Onset  . Cancer Mother  breast  . Pneumonia Father   . Cancer Brother     Social History Social History  Substance Use Topics  . Smoking status: Former Smoker    Years: 15.00    Types: Cigarettes    Quit date: 01/30/1963  . Smokeless tobacco: Never Used  . Alcohol use 1.2 oz/week    1 Glasses of wine, 1 Cans of beer per week     Comment: 14 per week     Allergies   Lyrica [pregabalin]   Review of Systems Review of Systems  10 systems reviewed and found to be negative, except as noted in the HPI.   Physical Exam Updated Vital Signs BP (!) 203/102   Pulse 82   Temp 98.8 F (37.1 C)   Resp 12   SpO2 100%   Physical Exam  Constitutional: He is oriented to person, place, and time. He appears well-developed. No distress.  Frail  HENT:  Head: Normocephalic and atraumatic.  Mouth/Throat: Oropharynx is clear and moist.  Eyes: Conjunctivae and EOM are normal. Pupils are equal, round, and reactive to light.  Neck: Normal range of motion.  Cardiovascular: Normal rate,  regular rhythm and intact distal pulses.   Pulmonary/Chest: Effort normal and breath sounds normal.  Abdominal: Soft. There is no tenderness.  Musculoskeletal: Normal range of motion.  Neurological: He is alert and oriented to person, place, and time.  III/IV/VI-Extraocular movements intact.  Pupils reactive bilaterally. V/VII-Smile symmetric, equal eyebrow raise,  facial sensation intact VIII- Hearing grossly intact IX/X-Normal gag XI-bilateral shoulder shrug XII-midline tongue extension Motor: 5/5 bilaterally with normal tone and bulk Cerebellar: Normal finger-to-nose  and normal heel-to-shin test.   Ambulation not tested  Skin: He is not diaphoretic.  Psychiatric: He has a normal mood and affect.  Nursing note and vitals reviewed.    ED Treatments / Results  Labs (all labs ordered are listed, but only abnormal results are displayed) Labs Reviewed  CBC - Abnormal; Notable for the following:       Result Value   WBC 12.5 (*)    RBC 3.52 (*)    Hemoglobin 11.3 (*)    HCT 34.3 (*)    RDW 15.8 (*)    All other components within normal limits  COMPREHENSIVE METABOLIC PANEL - Abnormal; Notable for the following:    Glucose, Bld 121 (*)    BUN 34 (*)    Creatinine, Ser 1.31 (*)    Calcium 8.3 (*)    Total Protein 5.1 (*)    Albumin 2.9 (*)    Alkaline Phosphatase 37 (*)    GFR calc non Af Amer 47 (*)    GFR calc Af Amer 54 (*)    All other components within normal limits  APTT - Abnormal; Notable for the following:    aPTT 20 (*)    All other components within normal limits  BRAIN NATRIURETIC PEPTIDE - Abnormal; Notable for the following:    B Natriuretic Peptide 481.2 (*)    All other components within normal limits  TROPONIN I - Abnormal; Notable for the following:    Troponin I 0.06 (*)    All other components within normal limits  PROTIME-INR  URINALYSIS, ROUTINE W REFLEX MICROSCOPIC    EKG  EKG Interpretation  Date/Time:  Sunday October 07 2016 13:45:17  EST Ventricular Rate:  89 PR Interval:    QRS Duration: 97 QT Interval:  322 QTC Calculation: 392 R Axis:   -31 Text Interpretation:  Sinus rhythm Probable  left atrial enlargement LVH with secondary repolarization abnormality Confirmed by Winfred Leeds  MD, SAM [phone]) on 10/07/2016 2:22:55 PM       Radiology Ct Head Wo Contrast  Result Date: 10/07/2016 CLINICAL DATA:  Aphasia EXAM: CT HEAD WITHOUT CONTRAST TECHNIQUE: Contiguous axial images were obtained from the base of the skull through the vertex without intravenous contrast. COMPARISON:  None. FINDINGS: Brain: There is no evidence for acute hemorrhage, hydrocephalus, mass lesion, or abnormal extra-axial fluid collection. No definite CT evidence for acute infarction. Diffuse loss of parenchymal volume is consistent with atrophy. Patchy low attenuation in the deep hemispheric and periventricular white matter is nonspecific, but likely reflects chronic microvascular ischemic demyelination. Vascular: No hyperdense vessel or unexpected calcification. Skull: No evidence for fracture. No worrisome lytic or sclerotic lesion. Sinuses/Orbits: The visualized paranasal sinuses and mastoid air cells are clear. Visualized portions of the globes and intraorbital fat are unremarkable. Other: None. IMPRESSION: 1. No acute intracranial abnormality. 2. Atrophy with chronic small vessel white matter ischemic disease. Electronically Signed   By: Misty Stanley M.D.   On: 10/07/2016 16:57   Dg Chest Port 1 View  Result Date: 10/07/2016 CLINICAL DATA:  Acute onset of aphasia. Loss of consciousness. Chronic lethargy. Initial encounter. EXAM: PORTABLE CHEST 1 VIEW COMPARISON:  Chest radiograph performed 04/18/2016 FINDINGS: The lungs are well-aerated. There is an 8 mm nodular density at the right midlung zone. This may be within the overlying scapula or rib, though no definite similar density was identified on the prior study. There is no evidence of pleural effusion or  pneumothorax. The cardiomediastinal silhouette is within normal limits. No acute osseous abnormalities are seen. IMPRESSION: 1. No acute cardiopulmonary process seen. 2. Question of 8 mm nodule at the right midlung zone. CT of the chest would be helpful for further evaluation, when and as deemed clinically appropriate. Electronically Signed   By: Garald Balding M.D.   On: 10/07/2016 17:14    Procedures Procedures (including critical care time) CRITICAL CARE Performed by: Monico Blitz   Total critical care time: 40 minutes  Critical care time was exclusive of separately billable procedures and treating other patients.  Critical care was necessary to treat or prevent imminent or life-threatening deterioration.  Critical care was time spent personally by me on the following activities: development of treatment plan with patient and/or surrogate as well as nursing, discussions with consultants, evaluation of patient's response to treatment, examination of patient, obtaining history from patient or surrogate, ordering and performing treatments and interventions, ordering and review of laboratory studies, ordering and review of radiographic studies, pulse oximetry and re-evaluation of patient's condition.   Medications Ordered in ED Medications  0.9 %  sodium chloride infusion ( Intravenous New Bag/Given 10/07/16 1620)  sodium chloride 0.9 % bolus 500 mL (0 mLs Intravenous Stopped 10/07/16 1818)  dexamethasone (DECADRON) injection 10 mg (10 mg Intravenous Given 10/07/16 1736)  aspirin chewable tablet 324 mg (324 mg Oral Given 10/07/16 1854)  labetalol (NORMODYNE,TRANDATE) injection 10 mg (10 mg Intravenous Given 10/07/16 1854)     Initial Impression / Assessment and Plan / ED Course  I have reviewed the triage vital signs and the nursing notes.  Pertinent labs & imaging results that were available during my care of the patient were reviewed by me and considered in my medical decision making  (see chart for details).     Vitals:   10/07/16 1730 10/07/16 1745 10/07/16 1815 10/07/16 1845  BP: (!) 209/106 (!) 208/97 (!) 215/106 Marland Kitchen)  203/102  Pulse: 85 84 89 82  Resp: 14 12 15 12   Temp:      TempSrc:      SpO2: 98% 99% 100% 100%    Medications  0.9 %  sodium chloride infusion ( Intravenous New Bag/Given 10/07/16 1620)  sodium chloride 0.9 % bolus 500 mL (0 mLs Intravenous Stopped 10/07/16 1818)  dexamethasone (DECADRON) injection 10 mg (10 mg Intravenous Given 10/07/16 1736)  aspirin chewable tablet 324 mg (324 mg Oral Given 10/07/16 1854)  labetalol (NORMODYNE,TRANDATE) injection 10 mg (10 mg Intravenous Given 10/07/16 1854)    Maysin Moehring is 83 y.o. male presenting with Episode of reduced responsiveness. Patient initially stated that this was issues with word finding however he denied this to attending physician, in discussing with his nurse at the nursing facility it was similar to episodes he's had in the past when his blood pressure lowers it was fleeting reduced responsiveness and maintenance of postural tone. Patient's CT and neurologic exam normal. EKG with no acute findings, will further evaluate with blood work and urinalysis.  Patient has a mild AKI with a creatinine of 1.3 and elevated BUN. Patient is given 500 mL bolus. He has a history of adrenal insufficiency and labile blood pressures, will give stress dose steroids. Delay in obtaining troponin is there was not sufficient quantity to run it initially.  This is a shared visit with the attending physician who personally evaluated the patient and agrees with the care plan.    Patient very lightheaded when standing and vital signs are significantly orthostatic. After giving bolus and stress dose steroids he feels significantly improved. BNP at 481. Chest x-ray without signs of volume overload, lung sounds are clear, patient saturating well on room air. I think this patient is dehydrated, the diuretic he is taking to treat  the BNP may have contributed to the episode of low responsiveness, he will have to walk a fine line with his adrenal insufficiency.  The troponin has resulted at 0.06. Patient did not have any chest pain this afternoon. His blood pressure is elevated, this is likely hypertensive urgency. I think it is reasonable to admit this patient to the hospital for hypertensive urgency workup. He is given 10 mg of labetalol and full dose aspirin. Will discuss with cardiology, he has a echo scheduled for this week. Will need admission for hypertensive urgency.  Cardiology consult from Dr. Radford Pax appreciated: Agrees with treating as hypertensive urgency and medical admission.   Discussed with Dr. Myna Hidalgo who will admit patient  Final Clinical Impressions(s) / ED Diagnoses   Final diagnoses:  Hypertensive urgency  Elevated troponin    New Prescriptions New Prescriptions   No medications on file     Monico Blitz, PA-C 10/07/16 [place], MD 10/08/16 1601

## 2016-10-07 NOTE — ED Notes (Signed)
Report given to Casey RN

## 2016-10-08 ENCOUNTER — Observation Stay (HOSPITAL_COMMUNITY): Payer: Medicare Other

## 2016-10-08 DIAGNOSIS — I10 Essential (primary) hypertension: Secondary | ICD-10-CM | POA: Diagnosis not present

## 2016-10-08 DIAGNOSIS — Z79899 Other long term (current) drug therapy: Secondary | ICD-10-CM | POA: Diagnosis not present

## 2016-10-08 DIAGNOSIS — G20 Parkinson's disease: Secondary | ICD-10-CM | POA: Diagnosis not present

## 2016-10-08 DIAGNOSIS — R479 Unspecified speech disturbances: Secondary | ICD-10-CM | POA: Diagnosis not present

## 2016-10-08 DIAGNOSIS — N179 Acute kidney failure, unspecified: Secondary | ICD-10-CM | POA: Diagnosis present

## 2016-10-08 DIAGNOSIS — E440 Moderate protein-calorie malnutrition: Secondary | ICD-10-CM

## 2016-10-08 DIAGNOSIS — M2042 Other hammer toe(s) (acquired), left foot: Secondary | ICD-10-CM | POA: Diagnosis not present

## 2016-10-08 DIAGNOSIS — E860 Dehydration: Secondary | ICD-10-CM | POA: Diagnosis present

## 2016-10-08 DIAGNOSIS — E538 Deficiency of other specified B group vitamins: Secondary | ICD-10-CM | POA: Diagnosis not present

## 2016-10-08 DIAGNOSIS — Z9842 Cataract extraction status, left eye: Secondary | ICD-10-CM | POA: Diagnosis not present

## 2016-10-08 DIAGNOSIS — D649 Anemia, unspecified: Secondary | ICD-10-CM | POA: Diagnosis present

## 2016-10-08 DIAGNOSIS — R55 Syncope and collapse: Secondary | ICD-10-CM | POA: Diagnosis not present

## 2016-10-08 DIAGNOSIS — I5033 Acute on chronic diastolic (congestive) heart failure: Secondary | ICD-10-CM | POA: Diagnosis not present

## 2016-10-08 DIAGNOSIS — Z9889 Other specified postprocedural states: Secondary | ICD-10-CM | POA: Diagnosis not present

## 2016-10-08 DIAGNOSIS — R531 Weakness: Secondary | ICD-10-CM | POA: Diagnosis not present

## 2016-10-08 DIAGNOSIS — K5900 Constipation, unspecified: Secondary | ICD-10-CM | POA: Diagnosis not present

## 2016-10-08 DIAGNOSIS — Z87891 Personal history of nicotine dependence: Secondary | ICD-10-CM | POA: Diagnosis not present

## 2016-10-08 DIAGNOSIS — I130 Hypertensive heart and chronic kidney disease with heart failure and stage 1 through stage 4 chronic kidney disease, or unspecified chronic kidney disease: Secondary | ICD-10-CM | POA: Diagnosis present

## 2016-10-08 DIAGNOSIS — Z888 Allergy status to other drugs, medicaments and biological substances status: Secondary | ICD-10-CM | POA: Diagnosis not present

## 2016-10-08 DIAGNOSIS — Z961 Presence of intraocular lens: Secondary | ICD-10-CM | POA: Diagnosis present

## 2016-10-08 DIAGNOSIS — I429 Cardiomyopathy, unspecified: Secondary | ICD-10-CM | POA: Diagnosis present

## 2016-10-08 DIAGNOSIS — I951 Orthostatic hypotension: Secondary | ICD-10-CM | POA: Diagnosis not present

## 2016-10-08 DIAGNOSIS — N183 Chronic kidney disease, stage 3 (moderate): Secondary | ICD-10-CM | POA: Diagnosis present

## 2016-10-08 DIAGNOSIS — E46 Unspecified protein-calorie malnutrition: Secondary | ICD-10-CM | POA: Diagnosis not present

## 2016-10-08 DIAGNOSIS — R748 Abnormal levels of other serum enzymes: Secondary | ICD-10-CM

## 2016-10-08 DIAGNOSIS — R404 Transient alteration of awareness: Secondary | ICD-10-CM | POA: Diagnosis not present

## 2016-10-08 DIAGNOSIS — G459 Transient cerebral ischemic attack, unspecified: Secondary | ICD-10-CM | POA: Diagnosis not present

## 2016-10-08 DIAGNOSIS — M6281 Muscle weakness (generalized): Secondary | ICD-10-CM | POA: Diagnosis not present

## 2016-10-08 DIAGNOSIS — R269 Unspecified abnormalities of gait and mobility: Secondary | ICD-10-CM | POA: Diagnosis not present

## 2016-10-08 DIAGNOSIS — E2749 Other adrenocortical insufficiency: Secondary | ICD-10-CM | POA: Diagnosis not present

## 2016-10-08 DIAGNOSIS — K219 Gastro-esophageal reflux disease without esophagitis: Secondary | ICD-10-CM | POA: Diagnosis present

## 2016-10-08 DIAGNOSIS — R634 Abnormal weight loss: Secondary | ICD-10-CM | POA: Diagnosis not present

## 2016-10-08 DIAGNOSIS — D519 Vitamin B12 deficiency anemia, unspecified: Secondary | ICD-10-CM | POA: Diagnosis not present

## 2016-10-08 DIAGNOSIS — Z9049 Acquired absence of other specified parts of digestive tract: Secondary | ICD-10-CM | POA: Diagnosis not present

## 2016-10-08 DIAGNOSIS — L89152 Pressure ulcer of sacral region, stage 2: Secondary | ICD-10-CM | POA: Diagnosis present

## 2016-10-08 DIAGNOSIS — I509 Heart failure, unspecified: Secondary | ICD-10-CM | POA: Diagnosis not present

## 2016-10-08 DIAGNOSIS — R778 Other specified abnormalities of plasma proteins: Secondary | ICD-10-CM

## 2016-10-08 DIAGNOSIS — E778 Other disorders of glycoprotein metabolism: Secondary | ICD-10-CM | POA: Diagnosis present

## 2016-10-08 DIAGNOSIS — Z515 Encounter for palliative care: Secondary | ICD-10-CM | POA: Diagnosis not present

## 2016-10-08 DIAGNOSIS — D126 Benign neoplasm of colon, unspecified: Secondary | ICD-10-CM | POA: Diagnosis not present

## 2016-10-08 DIAGNOSIS — R2681 Unsteadiness on feet: Secondary | ICD-10-CM | POA: Diagnosis not present

## 2016-10-08 DIAGNOSIS — L84 Corns and callosities: Secondary | ICD-10-CM | POA: Diagnosis not present

## 2016-10-08 DIAGNOSIS — N400 Enlarged prostate without lower urinary tract symptoms: Secondary | ICD-10-CM | POA: Diagnosis not present

## 2016-10-08 DIAGNOSIS — R262 Difficulty in walking, not elsewhere classified: Secondary | ICD-10-CM | POA: Diagnosis not present

## 2016-10-08 DIAGNOSIS — E7800 Pure hypercholesterolemia, unspecified: Secondary | ICD-10-CM | POA: Diagnosis not present

## 2016-10-08 DIAGNOSIS — J810 Acute pulmonary edema: Secondary | ICD-10-CM | POA: Diagnosis not present

## 2016-10-08 DIAGNOSIS — R569 Unspecified convulsions: Secondary | ICD-10-CM | POA: Diagnosis not present

## 2016-10-08 DIAGNOSIS — J439 Emphysema, unspecified: Secondary | ICD-10-CM | POA: Diagnosis not present

## 2016-10-08 DIAGNOSIS — Z7189 Other specified counseling: Secondary | ICD-10-CM | POA: Diagnosis not present

## 2016-10-08 DIAGNOSIS — Z809 Family history of malignant neoplasm, unspecified: Secondary | ICD-10-CM | POA: Diagnosis not present

## 2016-10-08 DIAGNOSIS — R1312 Dysphagia, oropharyngeal phase: Secondary | ICD-10-CM | POA: Diagnosis not present

## 2016-10-08 DIAGNOSIS — R7989 Other specified abnormal findings of blood chemistry: Secondary | ICD-10-CM

## 2016-10-08 DIAGNOSIS — L97509 Non-pressure chronic ulcer of other part of unspecified foot with unspecified severity: Secondary | ICD-10-CM | POA: Diagnosis not present

## 2016-10-08 DIAGNOSIS — I248 Other forms of acute ischemic heart disease: Secondary | ICD-10-CM | POA: Diagnosis present

## 2016-10-08 LAB — BASIC METABOLIC PANEL
ANION GAP: 12 (ref 5–15)
BUN: 26 mg/dL — ABNORMAL HIGH (ref 6–20)
CO2: 24 mmol/L (ref 22–32)
CREATININE: 1.38 mg/dL — AB (ref 0.61–1.24)
Calcium: 8.2 mg/dL — ABNORMAL LOW (ref 8.9–10.3)
Chloride: 106 mmol/L (ref 101–111)
GFR, EST AFRICAN AMERICAN: 51 mL/min — AB (ref >60)
GFR, EST NON AFRICAN AMERICAN: 44 mL/min — AB (ref >60)
GLUCOSE: 167 mg/dL — AB (ref 65–99)
Potassium: 3.2 mmol/L — ABNORMAL LOW (ref 3.5–5.1)
Sodium: 142 mmol/L (ref 135–145)

## 2016-10-08 LAB — MRSA PCR SCREENING: MRSA by PCR: NEGATIVE

## 2016-10-08 LAB — TROPONIN I
TROPONIN I: 0.07 ng/mL — AB (ref <0.03)
Troponin I: 0.07 ng/mL (ref <0.03)

## 2016-10-08 LAB — MAGNESIUM: Magnesium: 1.9 mg/dL (ref 1.7–2.4)

## 2016-10-08 LAB — GLUCOSE, CAPILLARY: GLUCOSE-CAPILLARY: 167 mg/dL — AB (ref 65–99)

## 2016-10-08 MED ORDER — HYDRALAZINE HCL 20 MG/ML IJ SOLN
10.0000 mg | Freq: Four times a day (QID) | INTRAMUSCULAR | Status: DC | PRN
  Administered 2016-10-08 – 2016-10-09 (×3): 10 mg via INTRAVENOUS
  Filled 2016-10-08 (×3): qty 1

## 2016-10-08 MED ORDER — ENSURE ENLIVE PO LIQD
237.0000 mL | ORAL | Status: DC
  Administered 2016-10-08 – 2016-10-09 (×2): 237 mL via ORAL

## 2016-10-08 MED ORDER — POTASSIUM CHLORIDE CRYS ER 20 MEQ PO TBCR
40.0000 meq | EXTENDED_RELEASE_TABLET | Freq: Once | ORAL | Status: AC
  Administered 2016-10-08: 40 meq via ORAL
  Filled 2016-10-08: qty 2

## 2016-10-08 MED ORDER — BOOST / RESOURCE BREEZE PO LIQD
1.0000 | ORAL | Status: DC
  Administered 2016-10-08 – 2016-10-12 (×4): 1 via ORAL

## 2016-10-08 MED ORDER — MIDODRINE HCL 5 MG PO TABS
2.5000 mg | ORAL_TABLET | Freq: Every day | ORAL | Status: DC
  Administered 2016-10-08 – 2016-10-10 (×3): 2.5 mg via ORAL
  Filled 2016-10-08 (×4): qty 1

## 2016-10-08 MED ORDER — MIDODRINE HCL 5 MG PO TABS
5.0000 mg | ORAL_TABLET | Freq: Two times a day (BID) | ORAL | Status: DC
  Administered 2016-10-08 – 2016-10-12 (×5): 5 mg via ORAL
  Filled 2016-10-08 (×8): qty 1

## 2016-10-08 MED ORDER — SODIUM CHLORIDE 0.9 % IV SOLN
INTRAVENOUS | Status: DC
  Administered 2016-10-08: 75 mL/h via INTRAVENOUS

## 2016-10-08 NOTE — Progress Notes (Signed)
Pt. HTN this am. On call for [facility] made aware. PRN to be administered. RN will continue to monitor.

## 2016-10-08 NOTE — Progress Notes (Addendum)
PROGRESS NOTE    Lawrence Turner  [id number] DOB: 1926-02-02 DOA: 10/07/2016 PCP: Jeanmarie Hubert, MD   Outpatient Specialists:     Brief Narrative:  Lawrence Turner is a 83 y.o. male with medical history significant for Parkinson's disease, adrenal insufficiency, questionable seizure history, GERD, and chronic constipation who presents from his nursing facility for evaluation of a transient altered level of consciousness. Patient has been following with endocrinology for adrenal insufficiency, and with neurology for Parkinson's disease, and his course has been complicated by apparent autonomic dysfunction and labile blood pressures. He has had multiple episodes involving transient loss of consciousness and today at the nursing facility, had an episode where he was not responding to staff, but with eyes apparently open and gazing straight ahead. There was no convulsive activity noted and after several seconds, the patient was able to squeeze a nurse's hand on command. He returned to his usual state within seconds, but reported feeling very fatigued. He denies any chest pain or palpitations and denies headache, change in vision or hearing, or focal numbness or weakness. There is been no recent fall or head trauma and no recent fevers or chills. Patient had a BNP drawn 2 weeks ago at the nursing home, it returned elevated, and he had Lasix increased. His corticosteroid was recently increased by the endocrinologist and, per the visit note, addition of midodrine was being considered.    Assessment & Plan:   Principal Problem:   Altered awareness, transient Active Problems:   Esophageal reflux   Normocytic anemia   Parkinson's disease (HCC)   Seizures (HCC)   Orthostatic hypotension   Pressure ulcer, stage 2   Secondary adrenal insufficiency (HCC)   Hypertensive urgency   Elevated troponin   Malnutrition of moderate degree   Transient ALOC  - Pt presents following a transient episode at his  nursing home in which he was not responding, but with eyes open and "staring off;" he returned to usual state within seconds to a minute and had no complaints other than fatigue - does not appear to be seizures -EEG shows slowing - Per his family at bedside, he has had multiple such episodes - Head CT is negative for acute pathology and patient is in his usual state on admission  - BP is very labile and there is significant orthostasis; suspect transient hypotension as the cause of his episodes - recheck orthos this PM  Orthostatic hypotension, labile BP - This has been an ongoing problem, likely related to PD and autonomic dysfunction, with adrenal insufficiency possibly contributing  - Per recent endocrinology notes, steroid was increased and midodrine was being considered  - He was given 10 mg Decadron in ED and is continued on a gentle IVF hydration  - Check TTE    Parkinson disease  - Appears to be stable, concerned for autonomic dysfunction as above, continue Sinemet    Adrenal insufficiency  - Pt follows with endocrinology, managed with Florinef and Cortef, will continue  - Electrolytes wnl, no N/V or abd pain - He was given 10 mg IV Decadron in ED  -appreciate Dr. Dwyane Dee-- stop florinef-- add midodrine 5 mg 2x/day and 2.5 mg in evening  Acute kidney injury  - SCr is 1.31 on admission, up from apparent baseline of 1.0  - Likely a prerenal azotemia given his clinical dehydration and transient hypotension  - He was given 500 cc NS in ED and is continued on NS infusion  - Hold Lasix  Elevated troponin  -  Troponin slightly elevated on admission to 0.06  - There is no anginal complaint on admission and this likely reflects demand ischemia in setting of transient episodes of hypotension  - Monitor on telemetry for ischemic changes, trend troponin   GERD - Stable, no EGD report on file  - Managed with Pepcid at home, will continue   Normocytic anemia - Hgb is 11.3 on  admission and stable relative to recent priors  - No sign of bleeding, likely secondary to chronic disease  - Continue B12 supplementation     DVT prophylaxis:  Lovenox   Code Status: DNR   Family Communication: Son at bedside  Disposition Plan:     Consultants:        Subjective: No further episodes while in the hospital  Objective: Vitals:   10/07/16 2100 10/07/16 2150 10/08/16 0100 10/08/16 0400  BP: 152/82 (!) 158/123 (!) 178/81 (!) 195/92  Pulse: 78 86 86 80  Resp: 12 14 16 16   Temp:  98.9 F (37.2 C) 98.3 F (36.8 C) 98.4 F (36.9 C)  TempSrc:  Oral Oral Oral  SpO2: 99% 100% 100% 98%  Weight:  62.1 kg (136 lb 14.4 oz)  62.1 kg (136 lb 14.4 oz)  Height:  5\' 10"  (1.778 m)      Intake/Output Summary (Last 24 hours) at 10/08/16 1209 Last data filed at 10/08/16 1200  Gross per 24 hour  Intake             1600 ml  Output              750 ml  Net              850 ml   Filed Weights   10/07/16 2150 10/08/16 0400  Weight: 62.1 kg (136 lb 14.4 oz) 62.1 kg (136 lb 14.4 oz)    Examination:  General exam: flat facies  Respiratory system: Clear to auscultation. Respiratory effort normal. Cardiovascular system: S1 & S2 heard, RRR. No JVD, murmurs, rubs, gallops or clicks. No pedal edema. Gastrointestinal system: Abdomen is nondistended, soft and nontender. No organomegaly or masses felt. Normal bowel sounds heard. Central nervous system: Alert- speech difficult to understand     Data Reviewed: I have personally reviewed following labs and imaging studies  CBC:  Recent Labs Lab 10/07/16 1454  WBC 12.5*  HGB 11.3*  HCT 34.3*  MCV 97.4  PLT [id number]   Basic Metabolic Panel:  Recent Labs Lab 10/04/16 10/07/16 1454 10/08/16 0312  NA 142 138 142  K 3.4 3.9 3.2*  CL  --  105 106  CO2  --  22 24  GLUCOSE  --  121* 167*  BUN 26* 34* 26*  CREATININE 1.0 1.31* 1.38*  CALCIUM  --  8.3* 8.2*   GFR: Estimated Creatinine Clearance: 31.9 mL/min  (by C-G formula based on SCr of 1.38 mg/dL (H)). Liver Function Tests:  Recent Labs Lab 10/07/16 1454  AST 30  ALT 23  ALKPHOS 37*  BILITOT 1.2  PROT 5.1*  ALBUMIN 2.9*   No results for input(s): LIPASE, AMYLASE in the last 168 hours. No results for input(s): AMMONIA in the last 168 hours. Coagulation Profile:  Recent Labs Lab 10/07/16 1454  INR 1.13   Cardiac Enzymes:  Recent Labs Lab 10/07/16 1717 10/07/16 2100 10/08/16 0312 10/08/16 0952  TROPONINI 0.06* 0.08* 0.07* 0.07*   BNP (last 3 results) No results for input(s): PROBNP in the last 8760 hours. HbA1C: No results for input(s): HGBA1C  in the last 72 hours. CBG:  Recent Labs Lab 10/08/16 0557  GLUCAP 167*   Lipid Profile: No results for input(s): CHOL, HDL, LDLCALC, TRIG, CHOLHDL, LDLDIRECT in the last 72 hours. Thyroid Function Tests:  Recent Labs  10/07/16 2000  TSH 0.918   Anemia Panel: No results for input(s): VITAMINB12, FOLATE, FERRITIN, TIBC, IRON, RETICCTPCT in the last 72 hours. Urine analysis:    Component Value Date/Time   COLORURINE YELLOW 10/07/2016 1438   APPEARANCEUR CLEAR 10/07/2016 1438   LABSPEC 1.009 10/07/2016 1438   PHURINE 6.0 10/07/2016 1438   GLUCOSEU NEGATIVE 10/07/2016 1438   HGBUR NEGATIVE 10/07/2016 1438   [place] 10/07/2016 1438   [place] 10/07/2016 1438   PROTEINUR NEGATIVE 10/07/2016 1438   NITRITE NEGATIVE 10/07/2016 [place] 10/07/2016 1438     ) Recent Results (from the past 240 hour(s))  MRSA PCR Screening     Status: None   Collection Time: 10/08/16  6:01 AM  Result Value Ref Range Status   MRSA by PCR NEGATIVE NEGATIVE Final    Comment:        The GeneXpert MRSA Assay (FDA approved for NASAL specimens only), is one component of a comprehensive MRSA colonization surveillance program. It is not intended to diagnose MRSA infection nor to guide or monitor treatment for MRSA infections.        Anti-infectives    None       Radiology Studies: Ct Head Wo Contrast  Result Date: 10/07/2016 CLINICAL DATA:  Aphasia EXAM: CT HEAD WITHOUT CONTRAST TECHNIQUE: Contiguous axial images were obtained from the base of the skull through the vertex without intravenous contrast. COMPARISON:  None. FINDINGS: Brain: There is no evidence for acute hemorrhage, hydrocephalus, mass lesion, or abnormal extra-axial fluid collection. No definite CT evidence for acute infarction. Diffuse loss of parenchymal volume is consistent with atrophy. Patchy low attenuation in the deep hemispheric and periventricular white matter is nonspecific, but likely reflects chronic microvascular ischemic demyelination. Vascular: No hyperdense vessel or unexpected calcification. Skull: No evidence for fracture. No worrisome lytic or sclerotic lesion. Sinuses/Orbits: The visualized paranasal sinuses and mastoid air cells are clear. Visualized portions of the globes and intraorbital fat are unremarkable. Other: None. IMPRESSION: 1. No acute intracranial abnormality. 2. Atrophy with chronic small vessel white matter ischemic disease. Electronically Signed   By: Misty Stanley M.D.   On: 10/07/2016 16:57   Dg Chest Port 1 View  Result Date: 10/07/2016 CLINICAL DATA:  Acute onset of aphasia. Loss of consciousness. Chronic lethargy. Initial encounter. EXAM: PORTABLE CHEST 1 VIEW COMPARISON:  Chest radiograph performed 04/18/2016 FINDINGS: The lungs are well-aerated. There is an 8 mm nodular density at the right midlung zone. This may be within the overlying scapula or rib, though no definite similar density was identified on the prior study. There is no evidence of pleural effusion or pneumothorax. The cardiomediastinal silhouette is within normal limits. No acute osseous abnormalities are seen. IMPRESSION: 1. No acute cardiopulmonary process seen. 2. Question of 8 mm nodule at the right midlung zone. CT of the chest would be helpful for  further evaluation, when and as deemed clinically appropriate. Electronically Signed   By: Garald Balding M.D.   On: 10/07/2016 17:14        Scheduled Meds: . carbidopa-levodopa  1 tablet Oral TID  . DULoxetine  30 mg Oral Daily  . enoxaparin (LOVENOX) injection  40 mg Subcutaneous Q24H  . famotidine  20 mg Oral Daily  .  feeding supplement  1 Container Oral Q24H  . feeding supplement (ENSURE ENLIVE)  237 mL Oral Q24H  . feeding supplement (PRO-STAT SUGAR FREE 64)  30 mL Oral TID WC  . fludrocortisone  0.1 mg Oral Daily  . hydrocortisone  10 mg Oral QHS  . hydrocortisone  20 mg Oral Daily  . megestrol  200 mg Oral Daily  . psyllium  1 packet Oral Daily  . sodium chloride flush  3 mL Intravenous Q12H  . cyanocobalamin  1,000 mcg Oral Daily   Continuous Infusions:   LOS: 0 days    Time spent: 25 min    [place], DO [facility] Pager [phone]  If 7PM-7AM, please contact night-coverage [email or web address] Password [facility] 10/08/2016, 12:09 PM

## 2016-10-08 NOTE — Consult Note (Addendum)
[place] Nurse wound consult note Reason for Consult: Consult requested for buttocks/sacrum. Pt's son at the bedside to asses appearance and discuss plan of care. He states pt is frequently incontinent at home and wore incontinence briefs prior to admission.  Wound type: Pt has patchy areas of red macerated partial thickness skin loss; affected area is located over sacrum and bilat buttocks in a butterfly shape; approx 4X4X.1cm, dry peeling skin to edges. Appearance is consistent with moisture associated skin damage. Pressure Injury POA: Yes; This was present on admission but is NOT a pressure injury Dressing procedure/placement/frequency: Foam dressing to sacrum/buttocks; change Q 3 days or PRN soiling.  If pt is frequently incontinent, then leave dressing off and apply barrier cream PRN with each turning and cleaning episode. Please re-consult if further assistance is needed.  Thank-you,  Julien Girt MSN, [place], [place], [place], [place]

## 2016-10-08 NOTE — Progress Notes (Signed)
Pt. Arrived to unit from ED via stretcher. Pt.  Alert to self at time of admission. Unable to stand to get weight. Pt. Unsteady with assistance. Pt. Placed on telemetry. CCMD notified. Skin assessment completed. Call light placed within reach. Pts. Son at bedside. No s/s of distress or discomfort noted. RN will continue to monitor pt. For changes in condition. Oisin Yoakum, Katherine Roan

## 2016-10-08 NOTE — Progress Notes (Signed)
EEG completed; results pending.    

## 2016-10-08 NOTE — Progress Notes (Signed)
Transported for EEG

## 2016-10-08 NOTE — Progress Notes (Signed)
Initial Nutrition Assessment  DOCUMENTATION CODES:   Non-severe (moderate) malnutrition in context of chronic illness  INTERVENTION:  Ensure Enlive po once daily, each supplement provides 350 kcal and 20 grams of protein  Provide Boost Breeze once daily, provides 250 kcal and 9 grams of protein Provide Mighty Shake once daily, provides 500 kcal and 20 grams of protein Provide Magic Cup once daily, provides 290kcal and 9 grams of protein  Continue 30 ml Pro-stat TID, each dose provides 100 kcal and 15 grams of protein  NUTRITION DIAGNOSIS:   Malnutrition related to poor appetite, chronic illness as evidenced by severe depletion of muscle mass, moderate depletion of body fat.   GOAL:   Patient will meet greater than or equal to 90% of their needs   MONITOR:   PO intake, Supplement acceptance, Labs, Skin, Weight trends  REASON FOR ASSESSMENT:   Malnutrition Screening Tool    ASSESSMENT:   83 y.o. male with medical history significant for Parkinson's disease, adrenal insufficiency, questionable seizure history, GERD, and chronic constipation who presents from his nursing facility for evaluation of a transient altered level of consciousness.  Pt states that he has been eating normally. Son at bedside reports that patient ate about 50% of breakfast this morning which is normal for pt. Per son, pt has history of being underweight due to poor appetite, was started on hormone replacement therapy which seemed to help. Pt also has issues with chronic constipation for which he takes Metamucil with each meal. Pt has Boost Plus TID on PTA medication list. He states that he usually only drinks it once daily, because 3 is too much.  Pt has severe muscle wasting and moderate fat wasting per nutrition-focused physical exam. RD discussed additional supplement options that provide more calories and protein in a smaller volume. Discussed diet tips for constipation. Pt states that he probably needs  to drink more water.   Labs: low potassium, low calcium, low hemoglobin, low albumin  Diet Order:  Diet Heart Room service appropriate? Yes; Fluid consistency: Thin  Skin:  Wound (see comment) (MSAD on buttocks with partial thickness skin loss)  Last BM:  PTA  Height:   Ht Readings from Last 1 Encounters:  10/07/16 5\' 10"  (1.778 m)    Weight:   Wt Readings from Last 1 Encounters:  10/08/16 136 lb 14.4 oz (62.1 kg)    Ideal Body Weight:  75.45 kg  BMI:  Body mass index is 19.64 kg/m.  Estimated Nutritional Needs:   Kcal:  1750-2000  Protein:  80-95 grams  Fluid:  2 L/day  EDUCATION NEEDS:   No education needs identified at this time  Scarlette Ar RD, LDN, CSP Inpatient Clinical Dietitian Pager: [phone] After Hours Pager: [phone]

## 2016-10-08 NOTE — Procedures (Signed)
ELECTROENCEPHALOGRAM REPORT  Date of Study: 10/08/2016  Patient's Name: Lawrence Turner MRN: [id number] Date of Birth: 28-Sep-1925  Referring Provider: Dr. Christia Reading Opyd  Clinical History: This is an 83 year old man with altered mental status.  Medications: acetaminophen (TYLENOL) tablet 650 mg   carbidopa-levodopa (SINEMET CR) 50-200 MG per tablet controlled release 1 tablet  cyclobenzaprine (FLEXERIL) tablet 5 mg  DULoxetine (CYMBALTA) DR capsule 30 mg  enoxaparin (LOVENOX) injection 40 mg  famotidine (PEPCID) tablet 20 mg  feeding supplement (PRO-STAT SUGAR FREE 64) liquid 30 mL  fludrocortisone (FLORINEF) tablet 0.1 mg  hydrALAZINE (APRESOLINE) injection 10 mg  HYDROcodone-acetaminophen (NORCO/VICODIN) 5-325 MG per tablet 1-2 tablet  hydrocortisone (CORTEF) tablet 10 mg  hydrocortisone (CORTEF) tablet 20 mg  labetalol (NORMODYNE,TRANDATE) injection 5 mg  lactulose (CHRONULAC) 10 GM/15ML solution 40 g  megestrol (MEGACE) 400 MG/10ML suspension 200 mg  psyllium (HYDROCIL/METAMUCIL) packet 1 packet  vitamin B-12 (CYANOCOBALAMIN) tablet 1,000 mcg   Technical Summary: A multichannel digital EEG recording measured by the international 10-20 system with electrodes applied with paste and impedances below 5000 ohms performed as portable with EKG monitoring in an awake and asleep patient.  Hyperventilation and photic stimulation were not performed.  The digital EEG was referentially recorded, reformatted, and digitally filtered in a variety of bipolar and referential montages for optimal display.   Description: The patient is awake and asleep during the recording.  During maximal wakefulness, there is a symmetric, medium voltage 7 Hz posterior dominant rhythm that attenuates with eye opening. This is admixed with a small amount of diffuse 4-5 Hz theta slowing of the waking background.  During drowsiness and sleep, there is an increase in theta and delta slowing of the background with poorly  formed vertex waves and sleep spindles seen.  Hyperventilation and photic stimulation were not performed  There were no epileptiform discharges or electrographic seizures seen.    EKG lead was unremarkable.  Impression: This awake and asleep EEG is abnormal due to the presence of mild diffuse slowing of the waking background.  Clinical Correlation of the above findings indicates diffuse cerebral dysfunction that is non-specific in etiology and can be seen with hypoxic/ischemic injury, toxic/metabolic encephalopathies, neurodegenerative disorders, or medication effect.  The absence of epileptiform discharges does not rule out a clinical diagnosis of epilepsy.  Clinical correlation is advised.   Ellouise Newer, M.D.

## 2016-10-09 ENCOUNTER — Inpatient Hospital Stay (HOSPITAL_COMMUNITY): Payer: Medicare Other

## 2016-10-09 DIAGNOSIS — R55 Syncope and collapse: Secondary | ICD-10-CM

## 2016-10-09 LAB — GLUCOSE, CAPILLARY: Glucose-Capillary: 111 mg/dL — ABNORMAL HIGH (ref 65–99)

## 2016-10-09 LAB — CBC
HEMATOCRIT: 32.4 % — AB (ref 39.0–52.0)
Hemoglobin: 10.6 g/dL — ABNORMAL LOW (ref 13.0–17.0)
MCH: 32.2 pg (ref 26.0–34.0)
MCHC: 32.7 g/dL (ref 30.0–36.0)
MCV: 98.5 fL (ref 78.0–100.0)
Platelets: 167 10*3/uL (ref 150–400)
RBC: 3.29 MIL/uL — AB (ref 4.22–5.81)
RDW: 16.2 % — ABNORMAL HIGH (ref 11.5–15.5)
WBC: 13.7 10*3/uL — ABNORMAL HIGH (ref 4.0–10.5)

## 2016-10-09 LAB — BASIC METABOLIC PANEL
Anion gap: 8 (ref 5–15)
BUN: 31 mg/dL — ABNORMAL HIGH (ref 6–20)
CHLORIDE: 110 mmol/L (ref 101–111)
CO2: 24 mmol/L (ref 22–32)
Calcium: 8.5 mg/dL — ABNORMAL LOW (ref 8.9–10.3)
Creatinine, Ser: 1.04 mg/dL (ref 0.61–1.24)
GFR calc Af Amer: >60 mL/min (ref >60)
GFR calc non Af Amer: >60 mL/min (ref >60)
GLUCOSE: 120 mg/dL — AB (ref 65–99)
POTASSIUM: 3.4 mmol/L — AB (ref 3.5–5.1)
Sodium: 142 mmol/L (ref 135–145)

## 2016-10-09 LAB — ECHOCARDIOGRAM COMPLETE
Height: 70 in
Weight: 2264 oz

## 2016-10-09 MED ORDER — POTASSIUM CHLORIDE CRYS ER 20 MEQ PO TBCR
40.0000 meq | EXTENDED_RELEASE_TABLET | Freq: Once | ORAL | Status: AC
  Administered 2016-10-09: 40 meq via ORAL
  Filled 2016-10-09: qty 2

## 2016-10-09 NOTE — Evaluation (Signed)
Physical Therapy Evaluation Patient Details Name: Lawrence Turner MRN: [id number] DOB: 03-Oct-1925 Today's Date: 10/09/2016   History of Present Illness  83 yo admitted with AMS and lability from [facility]. Pt with PMHx: parkinsons, adrenal insufficiency, questionable seizure history  Clinical Impression  Pt pleasant with decreased activity tolerance, weakness, impaired gait and functional mobility who will benefit from acute therapy to maximize mobility, function and gait to decrease burden of care. Pt with significant orthostatic drop in systolic BP with activity, asymptomatic and limited mobility with RN aware. Pt states he has a history of labile BP with at least 3 falls in the last year related to.   Orthostatic BPs  Supine 183/83, HR 95  Sitting 144/76, HR 97     Standing 93/52, HR 98, MAp 61  Standing after 3 min 94/53, HR 102, MAP 61          Follow Up Recommendations Supervision/Assistance - 24 hour;SNF    Equipment Recommendations  None recommended by PT    Recommendations for Other Services       Precautions / Restrictions Precautions Precautions: Fall      Mobility  Bed Mobility Overal bed mobility: Needs Assistance Bed Mobility: Supine to Sit     Supine to sit: Mod assist     General bed mobility comments: assist to move legs off of bed and elevate trunk from surface  Transfers Overall transfer level: Needs assistance   Transfers: Sit to/from Stand Sit to Stand: Min assist         General transfer comment: cues for hand placement and safety with assist to rise  Ambulation/Gait Ambulation/Gait assistance: Min assist Ambulation Distance (Feet): 8 Feet Assistive device: Rolling walker (2 wheeled) Gait Pattern/deviations: Shuffle;Trunk flexed   Gait velocity interpretation: Below normal speed for age/gender General Gait Details: pt maintains crouched posture with cues for posture, position in RW and safety. pt with unsteady gait but no  reports of dizziness or presyncope and limited by fatigue  Stairs            Wheelchair Mobility    Modified Rankin (Stroke Patients Only)       Balance Overall balance assessment: Needs assistance   Sitting balance-Leahy Scale: Fair       Standing balance-Leahy Scale: Poor                               Pertinent Vitals/Pain Pain Assessment: No/denies pain    Home Living Family/patient expects to be discharged to:: Assisted living               Home Equipment: Walker - 2 wheels      Prior Function Level of Independence: Independent with assistive device(s);Needs assistance   Gait / Transfers Assistance Needed: pt states he uses a walker with supervision but can walk over 100'  ADL's / Homemaking Assistance Needed: pt states he has assist for bathing 2x/wk  Comments: pt with varied history stating that he was in ALF at [facility] but stating assist for all mobility and use of RW     Hand Dominance        Extremity/Trunk Assessment   Upper Extremity Assessment Upper Extremity Assessment: Generalized weakness    Lower Extremity Assessment Lower Extremity Assessment: Generalized weakness    Cervical / Trunk Assessment Cervical / Trunk Assessment: Kyphotic  Communication   Communication: No difficulties  Cognition Arousal/Alertness: Awake/alert Behavior During Therapy: Flat affect Overall  Cognitive Status: Impaired/Different from baseline Area of Impairment: Memory;Orientation Orientation Level: Time   Memory: Decreased short-term memory              General Comments      Exercises     Assessment/Plan    PT Assessment Patient needs continued PT services  PT Problem List Decreased strength;Decreased mobility;Decreased safety awareness;Decreased activity tolerance;Cardiopulmonary status limiting activity;Decreased knowledge of use of DME;Decreased balance;Decreased skin integrity       PT Treatment Interventions  Gait training;Therapeutic exercise;Patient/family education;DME instruction;Therapeutic activities;Functional mobility training;Balance training    PT Goals (Current goals can be found in the Care Plan section)  Acute Rehab PT Goals Patient Stated Goal: be able to move PT Goal Formulation: With patient Time For Goal Achievement: 10/23/16 Potential to Achieve Goals: Fair    Frequency Min 3X/week   Barriers to discharge        Co-evaluation               End of Session Equipment Utilized During Treatment: Gait belt Activity Tolerance: Patient tolerated treatment well Patient left: in chair;with call bell/phone within reach;with chair alarm set Nurse Communication: Mobility status;Precautions PT Visit Diagnosis: Difficulty in walking, not elsewhere classified (R26.2);Muscle weakness (generalized) (M62.81)         Time: [id number] PT Time Calculation (min) (ACUTE ONLY): 23 min   Charges:   PT Evaluation $PT Eval Moderate Complexity: 1 Procedure PT Treatments $Therapeutic Activity: 8-22 mins   PT G Codes:         Sheray Grist B Jazilyn Siegenthaler October 14, 2016, 10:30 AM  Elwyn Reach, [place]

## 2016-10-09 NOTE — Progress Notes (Signed)
PROGRESS NOTE    Lawrence Turner  [id number] DOB: [birth year] DOA: 10/07/2016 PCP: Jeanmarie Hubert, MD   Outpatient Specialists:     Brief Narrative:  Lawrence Turner is a 83 y.o. male with medical history significant for Parkinson's disease, adrenal insufficiency, questionable seizure history, GERD, and chronic constipation who presents from his nursing facility for evaluation of a transient altered level of consciousness. Patient has been following with endocrinology for adrenal insufficiency, and with neurology for Parkinson's disease, and his course has been complicated by apparent autonomic dysfunction and labile blood pressures. He has had multiple episodes involving transient loss of consciousness and today at the nursing facility, had an episode where he was not responding to staff, but with eyes apparently open and gazing straight ahead. There was no convulsive activity noted and after several seconds, the patient was able to squeeze a nurse's hand on command. He returned to his usual state within seconds, but reported feeling very fatigued. He denies any chest pain or palpitations and denies headache, change in vision or hearing, or focal numbness or weakness. There is been no recent fall or head trauma and no recent fevers or chills. Patient had a BNP drawn 2 weeks ago at the nursing home, it returned elevated, and he had Lasix increased. His corticosteroid was recently increased by the endocrinologist and, per the visit note, addition of midodrine was being considered.    Assessment & Plan:   Principal Problem:   Altered awareness, transient Active Problems:   Esophageal reflux   Normocytic anemia   Parkinson's disease (HCC)   Seizures (HCC)   Orthostatic hypotension   Pressure ulcer, stage 2   Secondary adrenal insufficiency (HCC)   Hypertensive urgency   Elevated troponin   Malnutrition of moderate degree   Transient alteration of awareness   Transient ALOC  - Pt presents  following a transient episode at his nursing home in which he was not responding, but with eyes open and "staring off;" he returned to usual state within seconds to a minute and had no complaints other than fatigue - does not appear to be seizures -EEG shows slowing but no seizure acitivity - Per his family at bedside, he has had multiple such episodes - Head CT is negative for acute pathology and patient is in his usual state on admission  - BP is very labile and there is significant orthostasis; suspect transient hypotension as the cause of his episodes  Orthostatic hypotension, labile BP - This has been an ongoing problem, likely related to PD and autonomic dysfunction, with adrenal insufficiency possibly contributing  - Per recent endocrinology notes, steroid was increased and midodrine was being considered  - He was given 10 mg Decadron in ED and is continued on a gentle IVF hydration  - echo still pending -midodrine added after discussion with Dr. Dwyane Dee  Parkinson disease  - Appears to be stable, concerned for autonomic dysfunction as above, continue Sinemet   -family inquiring about an increase of medications- will ask neuro  Adrenal insufficiency   -appreciate Dr. Dwyane Dee-- stop florinef-- add midodrine 5 mg 2x/day and 2.5 mg in evening -continue cortef  Acute kidney injury  - SCr is 1.31 on admission, up from apparent baseline of 1.0  - Likely a prerenal azotemia given his clinical dehydration and transient hypotension  - He was given 500 cc NS in ED and is continued on NS infusion  - Hold Lasix  Elevated troponin  - Troponin slightly elevated on admission to 0.06  -  There is no anginal complaint on admission and this likely reflects demand ischemia in setting of transient episodes of hypotension  - Monitor on telemetry for ischemic changes, trend troponin   GERD - Stable, no EGD report on file  - Managed with Pepcid at home, will continue   Normocytic anemia - Hgb  is 11.3 on admission and stable relative to recent priors  - No sign of bleeding, likely secondary to chronic disease  - Continue B12 supplementation   Esophageal dysmotility -SLP to make diet adjustments -suspect related to parkinson's disease  DVT prophylaxis:  Lovenox   Code Status: DNR   Family Communication: Son on phone  Disposition Plan:  SNF    Consultants:        Subjective: No symptoms today while getting up with PT  Objective: Vitals:   10/09/16 0625 10/09/16 0648 10/09/16 0728 10/09/16 1022  BP: (!) 183/84 (!) 173/82 (!) 171/81   Pulse:  89    Resp:      Temp:      TempSrc:      SpO2:    98%  Weight: 64.2 kg (141 lb 8 oz)     Height:        Intake/Output Summary (Last 24 hours) at 10/09/16 1101 Last data filed at 10/09/16 0912  Gross per 24 hour  Intake              923 ml  Output             1001 ml  Net              -78 ml   Filed Weights   10/07/16 2150 10/08/16 0400 10/09/16 0625  Weight: 62.1 kg (136 lb 14.4 oz) 62.1 kg (136 lb 14.4 oz) 64.2 kg (141 lb 8 oz)    Examination:  General exam: flat facies  Respiratory system: Clear to auscultation. Respiratory effort normal. Cardiovascular system: S1 & S2 heard, RRR. No JVD, murmurs, rubs, gallops or clicks. No pedal edema. Gastrointestinal system: Abdomen is nondistended, soft and nontender. No organomegaly or masses felt. Normal bowel sounds heard. Central nervous system: Alert- speech difficult to understand     Data Reviewed: I have personally reviewed following labs and imaging studies  CBC:  Recent Labs Lab 10/07/16 1454 10/09/16 0536  WBC 12.5* 13.7*  HGB 11.3* 10.6*  HCT 34.3* 32.4*  MCV 97.4 98.5  PLT 163 [id number]   Basic Metabolic Panel:  Recent Labs Lab 10/04/16 10/07/16 1454 10/08/16 0312 10/08/16 1200 10/09/16 0536  NA 142 138 142  --  142  K 3.4 3.9 3.2*  --  3.4*  CL  --  105 106  --  110  CO2  --  22 24  --  24  GLUCOSE  --  121* 167*  --  120*    BUN 26* 34* 26*  --  31*  CREATININE 1.0 1.31* 1.38*  --  1.04  CALCIUM  --  8.3* 8.2*  --  8.5*  MG  --   --   --  1.9  --    GFR: Estimated Creatinine Clearance: 43.7 mL/min (by C-G formula based on SCr of 1.04 mg/dL). Liver Function Tests:  Recent Labs Lab 10/07/16 1454  AST 30  ALT 23  ALKPHOS 37*  BILITOT 1.2  PROT 5.1*  ALBUMIN 2.9*   No results for input(s): LIPASE, AMYLASE in the last 168 hours. No results for input(s): AMMONIA in the last 168 hours. Coagulation Profile:  Recent Labs Lab 10/07/16 1454  INR 1.13   Cardiac Enzymes:  Recent Labs Lab 10/07/16 1717 10/07/16 2100 10/08/16 0312 10/08/16 0952  TROPONINI 0.06* 0.08* 0.07* 0.07*   BNP (last 3 results) No results for input(s): PROBNP in the last 8760 hours. HbA1C: No results for input(s): HGBA1C in the last 72 hours. CBG:  Recent Labs Lab 10/08/16 0557 10/09/16 0628  GLUCAP 167* 111*   Lipid Profile: No results for input(s): CHOL, HDL, LDLCALC, TRIG, CHOLHDL, LDLDIRECT in the last 72 hours. Thyroid Function Tests:  Recent Labs  10/07/16 2000  TSH 0.918   Anemia Panel: No results for input(s): VITAMINB12, FOLATE, FERRITIN, TIBC, IRON, RETICCTPCT in the last 72 hours. Urine analysis:    Component Value Date/Time   COLORURINE YELLOW 10/07/2016 1438   APPEARANCEUR CLEAR 10/07/2016 1438   LABSPEC 1.009 10/07/2016 1438   PHURINE 6.0 10/07/2016 1438   GLUCOSEU NEGATIVE 10/07/2016 1438   HGBUR NEGATIVE 10/07/2016 1438   [place] 10/07/2016 1438   [place] 10/07/2016 1438   PROTEINUR NEGATIVE 10/07/2016 1438   NITRITE NEGATIVE 10/07/2016 [place] 10/07/2016 1438     ) Recent Results (from the past 240 hour(s))  MRSA PCR Screening     Status: None   Collection Time: 10/08/16  6:01 AM  Result Value Ref Range Status   MRSA by PCR NEGATIVE NEGATIVE Final    Comment:        The GeneXpert MRSA Assay (FDA approved for NASAL  specimens only), is one component of a comprehensive MRSA colonization surveillance program. It is not intended to diagnose MRSA infection nor to guide or monitor treatment for MRSA infections.       Anti-infectives    None       Radiology Studies: Ct Head Wo Contrast  Result Date: 10/07/2016 CLINICAL DATA:  Aphasia EXAM: CT HEAD WITHOUT CONTRAST TECHNIQUE: Contiguous axial images were obtained from the base of the skull through the vertex without intravenous contrast. COMPARISON:  None. FINDINGS: Brain: There is no evidence for acute hemorrhage, hydrocephalus, mass lesion, or abnormal extra-axial fluid collection. No definite CT evidence for acute infarction. Diffuse loss of parenchymal volume is consistent with atrophy. Patchy low attenuation in the deep hemispheric and periventricular white matter is nonspecific, but likely reflects chronic microvascular ischemic demyelination. Vascular: No hyperdense vessel or unexpected calcification. Skull: No evidence for fracture. No worrisome lytic or sclerotic lesion. Sinuses/Orbits: The visualized paranasal sinuses and mastoid air cells are clear. Visualized portions of the globes and intraorbital fat are unremarkable. Other: None. IMPRESSION: 1. No acute intracranial abnormality. 2. Atrophy with chronic small vessel white matter ischemic disease. Electronically Signed   By: Misty Stanley M.D.   On: 10/07/2016 16:57   Ct Chest Wo Contrast  Result Date: 10/08/2016 CLINICAL DATA:  Shortness of breath. Lung nodule. Abnormal chest x-ray. EXAM: CT CHEST WITHOUT CONTRAST TECHNIQUE: Multidetector CT imaging of the chest was performed following the standard protocol without IV contrast. COMPARISON:  None. FINDINGS: Cardiovascular: The heart size is normal. Blood pool is hypodense, compatible with known anemia. Coronary artery calcifications are present. The aorta and pulmonary arteries are within normal limits. Mediastinum/Nodes: No significant  mediastinal or axillary adenopathy is present. The thoracic inlet is normal. The esophagus is dilated throughout its course in the chest. Lungs/Pleura: Centrilobular emphysema is present. There is no focal lung nodule to correspond to the finding on the chest x-ray. Finding corresponds with overlap of a posterior right rib and the scapula. Linear  scarring or atelectasis is present at the left base. No significant pleural effusion or pneumothorax is present. Upper Abdomen: Cholecystectomy is noted. Limited imaging the upper abdomen demonstrates moderate distention of the stomach. No other focal lesions are present. Musculoskeletal: Fusion of anterior osteophytes throughout the thoracic spine is compatible with DISH. Vertebral body heights are maintained. No focal lytic or blastic lesions are evident. IMPRESSION: 1. No pulmonary nodule.  The finding was likely artifactual. 2. Mild emphysema. 3. Mild atherosclerotic cysts including coronary artery disease. 4. Dilation of the esophagus throughout the mediastinum suggesting esophageal dysmotility. No mass lesion is present. 5. Fusion of anterior osteophytes compatible with DISH. Electronically Signed   By: San Morelle M.D.   On: 10/08/2016 20:35   Dg Chest Port 1 View  Result Date: 10/07/2016 CLINICAL DATA:  Acute onset of aphasia. Loss of consciousness. Chronic lethargy. Initial encounter. EXAM: PORTABLE CHEST 1 VIEW COMPARISON:  Chest radiograph performed 04/18/2016 FINDINGS: The lungs are well-aerated. There is an 8 mm nodular density at the right midlung zone. This may be within the overlying scapula or rib, though no definite similar density was identified on the prior study. There is no evidence of pleural effusion or pneumothorax. The cardiomediastinal silhouette is within normal limits. No acute osseous abnormalities are seen. IMPRESSION: 1. No acute cardiopulmonary process seen. 2. Question of 8 mm nodule at the right midlung zone. CT of the chest  would be helpful for further evaluation, when and as deemed clinically appropriate. Electronically Signed   By: Garald Balding M.D.   On: 10/07/2016 17:14        Scheduled Meds: . carbidopa-levodopa  1 tablet Oral TID  . DULoxetine  30 mg Oral Daily  . enoxaparin (LOVENOX) injection  40 mg Subcutaneous Q24H  . famotidine  20 mg Oral Daily  . feeding supplement  1 Container Oral Q24H  . feeding supplement (ENSURE ENLIVE)  237 mL Oral Q24H  . feeding supplement (PRO-STAT SUGAR FREE 64)  30 mL Oral TID WC  . hydrocortisone  10 mg Oral QHS  . hydrocortisone  20 mg Oral Daily  . megestrol  200 mg Oral Daily  . midodrine  2.5 mg Oral Q2000  . midodrine  5 mg Oral BID WC  . psyllium  1 packet Oral Daily  . sodium chloride flush  3 mL Intravenous Q12H  . cyanocobalamin  1,000 mcg Oral Daily   Continuous Infusions:   LOS: 1 day    Time spent: 25 min    [place], DO [facility] Pager [phone]  If 7PM-7AM, please contact night-coverage [email or web address] Password [email or web address] 10/09/2016, 11:01 AM

## 2016-10-09 NOTE — Consult Note (Signed)
   [facility] CM Inpatient Consult   10/09/2016  Lawrence Turner 10/08/1925 [id number]   Patient screened for potential [facility] services. Patient is eligible for [facility] Care Management services under patient's Medicare ACO plan.  Chart review reveals the patient is Lawrence Turner a 83 y.o.malewith medical history significant for Parkinson's disease, adrenal insufficiency, questionable seizure history, GERD, and chronic constipation who presents from his nursing facility for evaluation of a transient altered level of consciousness. Patient has been following with endocrinology for adrenal insufficiency, and with neurology for Parkinson's disease, and his course has been complicated by apparent autonomic dysfunction and labile blood pressures. He has had multiple episodes involving transient loss of consciousness and today at the nursing facility, had an episode where he was not responding to staff, but with eyes apparently open and gazing straight ahead.  Patient is being recommended for a skilled level of care.  Patient is at [facility] prior to admission.   Patient was asleep and no family at the bedside at the time.  No THN needs currently noted as he is anticipated to DC to the skill level at his facility.   Natividad Brood, RN BSN [facility] Liaison  [phone] business mobile phone Toll free office [phone]

## 2016-10-09 NOTE — Evaluation (Signed)
Clinical/Bedside Swallow Evaluation Patient Details  Name: Lawrence Turner MRN: [id number] Date of Birth: October 15, 1925  Today's Date: 10/09/2016 Time: SLP Start Time (ACUTE ONLY): [id number] SLP Stop Time (ACUTE ONLY): 1436 SLP Time Calculation (min) (ACUTE ONLY): 18 min  Past Medical History:  Past Medical History:  Diagnosis Date  . Abnormality of gait 10/19/2015  . Anemia, unspecified   . Benign neoplasm of colon    pre-cancerous polyps removed at colonoscopy 2008  . Contact dermatitis and other eczema, due to unspecified cause   . Diverticulosis of colon (without mention of hemorrhage)   . Esophageal reflux   . Hypertrophy of prostate without urinary obstruction and other lower urinary tract symptoms (LUTS)   . Mononeuritis of unspecified site   . Orthostatic hypotension 04/18/2016  . Osteoarthrosis, unspecified whether generalized or localized, pelvic region and thigh   . Other B-complex deficiencies   . Other pulmonary embolism and infarction    following cholecystectomy 2003  . Parkinson disease ([place]) 10/19/2015  . Peripheral neuropathy ([place])   . Pure hypercholesterolemia   . Rosacea   . Seizures ([place]) 04/17/2016  . 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 Syncope and collapse 04/18/2016  . Ulcer of foot ([place]) 04/03/2016  . Unspecified constipation   . Unspecified essential hypertension   . Unspecified gastritis and gastroduodenitis without mention of hemorrhage 08/29/2006  . Unspecified hemorrhoids without mention of complication   . Unspecified vitamin D deficiency    Past Surgical History:  Past Surgical History:  Procedure Laterality Date  . CATARACT EXTRACTION W/ INTRAOCULAR LENS IMPLANT Left 5/20//2013  . CHOLECYSTECTOMY, LAPAROSCOPIC  2003  . colonic polyp removal  1965  . COLONOSCOPY  08/29/2006   hemorrhoids, 3 small polyps (hyperplastic) & diverticulosis  . HEMORRHOID SURGERY  1953   HPI:  83 y.o.malewith medical history significant for  Parkinson's disease, adrenal insufficiency, questionable seizure history, GERD, and chronic constipation who presents from his nursing facility for evaluation of a transient altered level of consciousness. Patient has been following with endocrinology for adrenal insufficiency, and with neurology for Parkinson's disease, and his course has been complicated by apparent autonomic dysfunction and labile blood pressures. He has had multiple episodes involving transient loss of consciousness    Assessment / Plan / Recommendation Clinical Impression  Pt presents with hypomimia, hypophonia, rapid articulatory rate, consistent with Parkinson's disease.  Swallow function appears to be stable with no decline since last evaluated in September of 2017.  There is adequate mastication, no changes in vocal quality or s/s of decreased airway protection s/p swallow.  Pt takes meds for reflux, but he states that he is generally asymptomatic.  No overt s/s of esophageal deficits per today's evaluation.  Recommend continuing regular diet, thin liquids.  No further SLP f/u warranted.  SLP Visit Diagnosis: Dysphagia, unspecified (R13.10)    Aspiration Risk  No limitations    Diet Recommendation     Medication Administration: Whole meds with liquid    Other  Recommendations Oral Care Recommendations: Oral care BID   Follow up Recommendations        Frequency and Duration            Prognosis        Swallow Study   General HPI: 83 y.o.malewith medical history significant for Parkinson's disease, adrenal insufficiency, questionable seizure history, GERD, and chronic constipation who presents from his nursing facility for evaluation of a transient altered level of consciousness. Patient has been following with endocrinology for  adrenal insufficiency, and with neurology for Parkinson's disease, and his course has been complicated by apparent autonomic dysfunction and labile blood pressures. He has had multiple  episodes involving transient loss of consciousness  Type of Study: Bedside Swallow Evaluation Previous Swallow Assessment: 04/30/16 bedside swallow evaluation with mild dysphagia secondary to Parkinson's Diet Prior to this Study: Regular;Thin liquids Temperature Spikes Noted: No Respiratory Status: Room air History of Recent Intubation: No Behavior/Cognition: Alert;Cooperative Oral Cavity Assessment: Within Functional Limits Oral Care Completed by SLP: No Oral Cavity - Dentition: Adequate natural dentition Vision: Functional for self-feeding Self-Feeding Abilities: Able to feed self Patient Positioning: Upright in bed Baseline Vocal Quality: Low vocal intensity Volitional Cough: Strong Volitional Swallow: Able to elicit    Oral/Motor/Sensory Function Overall Oral Motor/Sensory Function: Within functional limits   Ice Chips Ice chips: Within functional limits   Thin Liquid Thin Liquid: Within functional limits    Nectar Thick Nectar Thick Liquid: Not tested   Honey Thick Honey Thick Liquid: Not tested   Puree Puree: Within functional limits   Solid   GO   Solid: Within functional limits        Lawrence Turner 10/09/2016,2:41 PM

## 2016-10-09 NOTE — Progress Notes (Signed)
Orthostatics done by PT

## 2016-10-10 DIAGNOSIS — Z7189 Other specified counseling: Secondary | ICD-10-CM

## 2016-10-10 DIAGNOSIS — Z515 Encounter for palliative care: Secondary | ICD-10-CM

## 2016-10-10 DIAGNOSIS — E2749 Other adrenocortical insufficiency: Secondary | ICD-10-CM

## 2016-10-10 LAB — CBC
HEMATOCRIT: 30.9 % — AB (ref 39.0–52.0)
HEMOGLOBIN: 10 g/dL — AB (ref 13.0–17.0)
MCH: 31.9 pg (ref 26.0–34.0)
MCHC: 32.4 g/dL (ref 30.0–36.0)
MCV: 98.7 fL (ref 78.0–100.0)
Platelets: 156 10*3/uL (ref 150–400)
RBC: 3.13 MIL/uL — ABNORMAL LOW (ref 4.22–5.81)
RDW: 16.3 % — AB (ref 11.5–15.5)
WBC: 11.3 10*3/uL — ABNORMAL HIGH (ref 4.0–10.5)

## 2016-10-10 LAB — BASIC METABOLIC PANEL
Anion gap: 7 (ref 5–15)
BUN: 29 mg/dL — ABNORMAL HIGH (ref 6–20)
CHLORIDE: 109 mmol/L (ref 101–111)
CO2: 25 mmol/L (ref 22–32)
CREATININE: 1.07 mg/dL (ref 0.61–1.24)
Calcium: 8.3 mg/dL — ABNORMAL LOW (ref 8.9–10.3)
GFR calc Af Amer: >60 mL/min (ref >60)
GFR calc non Af Amer: 59 mL/min — ABNORMAL LOW (ref >60)
GLUCOSE: 111 mg/dL — AB (ref 65–99)
Potassium: 3.8 mmol/L (ref 3.5–5.1)
Sodium: 141 mmol/L (ref 135–145)

## 2016-10-10 LAB — CORTISOL-PM, BLOOD: Cortisol - PM: 22.5 ug/dL — ABNORMAL HIGH (ref <10.0)

## 2016-10-10 LAB — GLUCOSE, CAPILLARY: Glucose-Capillary: 117 mg/dL — ABNORMAL HIGH (ref 65–99)

## 2016-10-10 MED ORDER — POLYETHYLENE GLYCOL 3350 17 G PO PACK
17.0000 g | PACK | Freq: Every day | ORAL | Status: DC | PRN
Start: 2016-10-10 — End: 2016-10-12

## 2016-10-10 MED ORDER — MUSCLE RUB 10-15 % EX CREA
TOPICAL_CREAM | CUTANEOUS | Status: DC | PRN
  Filled 2016-10-10: qty 85

## 2016-10-10 MED ORDER — MAGNESIUM OXIDE 400 (241.3 MG) MG PO TABS
200.0000 mg | ORAL_TABLET | Freq: Every day | ORAL | Status: DC
  Administered 2016-10-10 – 2016-10-12 (×3): 200 mg via ORAL
  Filled 2016-10-10 (×3): qty 1

## 2016-10-10 NOTE — Progress Notes (Signed)
PROGRESS NOTE    Lawrence Turner  [id number] DOB: [birth year] DOA: 10/07/2016 PCP: Jeanmarie Hubert, MD   Outpatient Specialists:     Brief Narrative:  Lawrence Turner is a 83 y.o. male with medical history significant for Parkinson's disease, adrenal insufficiency, questionable seizure history, GERD, and chronic constipation who presents from his nursing facility for evaluation of a transient altered level of consciousness. Patient has been following with endocrinology for adrenal insufficiency, and with neurology for Parkinson's disease, and his course has been complicated by apparent autonomic dysfunction and labile blood pressures. He has had multiple episodes involving transient loss of consciousness and today at the nursing facility, had an episode where he was not responding to staff, but with eyes apparently open and gazing straight ahead. There was no convulsive activity noted and after several seconds, the patient was able to squeeze a nurse's hand on command. He returned to his usual state within seconds, but reported feeling very fatigued. He denies any chest pain or palpitations and denies headache, change in vision or hearing, or focal numbness or weakness. There is been no recent fall or head trauma and no recent fevers or chills. Patient had a BNP drawn 2 weeks ago at the nursing home, it returned elevated, and he had Lasix increased. His corticosteroid was recently increased by the endocrinologist and, per the visit note, addition of midodrine was being considered.    Assessment & Plan:   Principal Problem:   Altered awareness, transient Active Problems:   Esophageal reflux   Normocytic anemia   Parkinson's disease (HCC)   Seizures (HCC)   Orthostatic hypotension   Pressure ulcer, stage 2   Secondary adrenal insufficiency (HCC)   Hypertensive urgency   Elevated troponin   Malnutrition of moderate degree   Transient alteration of awareness   Transient ALOC - no further episodes  in hospital - Pt presents following a transient episode at his nursing home in which he was not responding, but with eyes open and "staring off;" he returned to usual state within seconds to a minute and had no complaints other than fatigue - does not appear to be seizures -EEG shows slowing but no seizure acitivity - Per his family at bedside, he has had multiple such episodes - Head CT is negative for acute pathology and patient is in his usual state on admission  - BP is very labile and there is significant orthostasis; suspect transient hypotension as the cause of his episodes  Orthostatic hypotension, labile BP - This has been an ongoing problem, likely related to PD and autonomic dysfunction, with adrenal insufficiency possibly contributing  - Per recent endocrinology notes, steroid was increased and midodrine was being considered  - echo: Left ventricle: The cavity size was normal. Wall thickness was   increased in a pattern of moderate LVH. Systolic function was   mildly to moderately reduced. The estimated ejection fraction was   in the range of 40% to 45%. Diffuse hypokinesis. Doppler   parameters are consistent with abnormal left ventricular   relaxation (grade 1 diastolic dysfunction). -will get cardiology consult -midodrine added after discussion with Dr. Dwyane Dee on phone  Parkinson disease  - Appears to be stable, concerned for autonomic dysfunction as above, continue Sinemet   -outpatient neuro follow up for medication changes  Adrenal insufficiency   -appreciate Dr. Dwyane Dee-- stop florinef-- add midodrine 5 mg 2x/day and 2.5 mg in evening -continue cortef  Acute kidney injury  - SCr is 1.31 on admission, up from apparent  baseline of 1.0  - Likely a prerenal azotemia given his clinical dehydration and transient hypotension  -d/c lasix  Elevated troponin  - Troponin slightly elevated on admission to 0.06  - There is no anginal complaint on admission and this likely  reflects demand ischemia in setting of transient episodes of hypotension  -troponins flat  GERD - Stable, no EGD report on file  - Managed with Pepcid at home, will continue   Normocytic anemia - Hgb is 11.3 on admission and stable relative to recent priors  - No sign of bleeding, likely secondary to chronic disease  - Continue B12 supplementation   Esophageal dysmotility -SLP to make diet adjustments -suspect related to parkinson's disease  Discussed palliative care consult with son--- had multiple questions about hospice and other items that I could not answer-- he is requesting a call to himself before meeting with father   DVT prophylaxis:  Lovenox   Code Status: DNR   Family Communication: Son on phone  Disposition Plan:  SNF    Consultants:   cards     Subjective: No further episodes today-- says he is uncomfortable in the bed  Objective: Vitals:   10/09/16 1618 10/09/16 1910 10/09/16 1929 10/10/16 0437  BP: (!) 195/94 (!) 119/51 (!) 112/57 (!) 176/78  Pulse: 88 79 79 73  Resp:   18 18  Temp:   99 F (37.2 C) 97.7 F (36.5 C)  TempSrc:   Oral Oral  SpO2:   100% 98%  Weight:    65 kg (143 lb 4.8 oz)  Height:        Intake/Output Summary (Last 24 hours) at 10/10/16 1032 Last data filed at 10/10/16 0908  Gross per 24 hour  Intake              720 ml  Output              650 ml  Net               70 ml   Filed Weights   10/08/16 0400 10/09/16 0625 10/10/16 0437  Weight: 62.1 kg (136 lb 14.4 oz) 64.2 kg (141 lb 8 oz) 65 kg (143 lb 4.8 oz)    Examination:  General exam: flat facies  Respiratory system: Clear to auscultation. Respiratory effort normal. Cardiovascular system: S1 & S2 heard, RRR. No JVD, murmurs, rubs, gallops or clicks. No pedal edema. Gastrointestinal system: Abdomen is nondistended, soft and nontender. No organomegaly or masses felt. Normal bowel sounds heard. Central nervous system: Alert- speech difficult to  understand     Data Reviewed: I have personally reviewed following labs and imaging studies  CBC:  Recent Labs Lab 10/07/16 1454 10/09/16 0536 10/10/16 0519  WBC 12.5* 13.7* 11.3*  HGB 11.3* 10.6* 10.0*  HCT 34.3* 32.4* 30.9*  MCV 97.4 98.5 98.7  PLT 163 167 [id number]   Basic Metabolic Panel:  Recent Labs Lab 10/04/16 10/07/16 1454 10/08/16 0312 10/08/16 1200 10/09/16 0536 10/10/16 0519  NA 142 138 142  --  142 141  K 3.4 3.9 3.2*  --  3.4* 3.8  CL  --  105 106  --  110 109  CO2  --  22 24  --  24 25  GLUCOSE  --  121* 167*  --  120* 111*  BUN 26* 34* 26*  --  31* 29*  CREATININE 1.0 1.31* 1.38*  --  1.04 1.07  CALCIUM  --  8.3* 8.2*  --  8.5*  8.3*  MG  --   --   --  1.9  --   --    GFR: Estimated Creatinine Clearance: 43 mL/min (by C-G formula based on SCr of 1.07 mg/dL). Liver Function Tests:  Recent Labs Lab 10/07/16 1454  AST 30  ALT 23  ALKPHOS 37*  BILITOT 1.2  PROT 5.1*  ALBUMIN 2.9*   No results for input(s): LIPASE, AMYLASE in the last 168 hours. No results for input(s): AMMONIA in the last 168 hours. Coagulation Profile:  Recent Labs Lab 10/07/16 1454  INR 1.13   Cardiac Enzymes:  Recent Labs Lab 10/07/16 1717 10/07/16 2100 10/08/16 0312 10/08/16 0952  TROPONINI 0.06* 0.08* 0.07* 0.07*   BNP (last 3 results) No results for input(s): PROBNP in the last 8760 hours. HbA1C: No results for input(s): HGBA1C in the last 72 hours. CBG:  Recent Labs Lab 10/08/16 0557 10/09/16 0628 10/10/16 0618  GLUCAP 167* 111* 117*   Lipid Profile: No results for input(s): CHOL, HDL, LDLCALC, TRIG, CHOLHDL, LDLDIRECT in the last 72 hours. Thyroid Function Tests:  Recent Labs  10/07/16 2000  TSH 0.918   Anemia Panel: No results for input(s): VITAMINB12, FOLATE, FERRITIN, TIBC, IRON, RETICCTPCT in the last 72 hours. Urine analysis:    Component Value Date/Time   COLORURINE YELLOW 10/07/2016 1438   APPEARANCEUR CLEAR 10/07/2016 1438    LABSPEC 1.009 10/07/2016 1438   PHURINE 6.0 10/07/2016 1438   GLUCOSEU NEGATIVE 10/07/2016 1438   HGBUR NEGATIVE 10/07/2016 1438   [place] 10/07/2016 1438   [place] 10/07/2016 1438   PROTEINUR NEGATIVE 10/07/2016 1438   NITRITE NEGATIVE 10/07/2016 [place] 10/07/2016 1438     ) Recent Results (from the past 240 hour(s))  MRSA PCR Screening     Status: None   Collection Time: 10/08/16  6:01 AM  Result Value Ref Range Status   MRSA by PCR NEGATIVE NEGATIVE Final    Comment:        The GeneXpert MRSA Assay (FDA approved for NASAL specimens only), is one component of a comprehensive MRSA colonization surveillance program. It is not intended to diagnose MRSA infection nor to guide or monitor treatment for MRSA infections.       Anti-infectives    None       Radiology Studies: Ct Chest Wo Contrast  Result Date: 10/08/2016 CLINICAL DATA:  Shortness of breath. Lung nodule. Abnormal chest x-ray. EXAM: CT CHEST WITHOUT CONTRAST TECHNIQUE: Multidetector CT imaging of the chest was performed following the standard protocol without IV contrast. COMPARISON:  None. FINDINGS: Cardiovascular: The heart size is normal. Blood pool is hypodense, compatible with known anemia. Coronary artery calcifications are present. The aorta and pulmonary arteries are within normal limits. Mediastinum/Nodes: No significant mediastinal or axillary adenopathy is present. The thoracic inlet is normal. The esophagus is dilated throughout its course in the chest. Lungs/Pleura: Centrilobular emphysema is present. There is no focal lung nodule to correspond to the finding on the chest x-ray. Finding corresponds with overlap of a posterior right rib and the scapula. Linear scarring or atelectasis is present at the left base. No significant pleural effusion or pneumothorax is present. Upper Abdomen: Cholecystectomy is noted. Limited imaging the upper abdomen demonstrates  moderate distention of the stomach. No other focal lesions are present. Musculoskeletal: Fusion of anterior osteophytes throughout the thoracic spine is compatible with DISH. Vertebral body heights are maintained. No focal lytic or blastic lesions are evident. IMPRESSION: 1. No pulmonary nodule.  The  finding was likely artifactual. 2. Mild emphysema. 3. Mild atherosclerotic cysts including coronary artery disease. 4. Dilation of the esophagus throughout the mediastinum suggesting esophageal dysmotility. No mass lesion is present. 5. Fusion of anterior osteophytes compatible with DISH. Electronically Signed   By: San Morelle M.D.   On: 10/08/2016 20:35        Scheduled Meds: . carbidopa-levodopa  1 tablet Oral TID  . DULoxetine  30 mg Oral Daily  . enoxaparin (LOVENOX) injection  40 mg Subcutaneous Q24H  . famotidine  20 mg Oral Daily  . feeding supplement  1 Container Oral Q24H  . feeding supplement (ENSURE ENLIVE)  237 mL Oral Q24H  . feeding supplement (PRO-STAT SUGAR FREE 64)  30 mL Oral TID WC  . hydrocortisone  10 mg Oral QHS  . hydrocortisone  20 mg Oral Daily  . magnesium oxide  200 mg Oral Daily  . megestrol  200 mg Oral Daily  . midodrine  2.5 mg Oral Q2000  . midodrine  5 mg Oral BID WC  . psyllium  1 packet Oral Daily  . sodium chloride flush  3 mL Intravenous Q12H  . cyanocobalamin  1,000 mcg Oral Daily   Continuous Infusions:   LOS: 2 days    Time spent: 25 min    [place], DO [facility] Pager [phone]  If 7PM-7AM, please contact night-coverage [email or web address] Password [facility] 10/10/2016, 10:32 AM

## 2016-10-10 NOTE — Consult Note (Signed)
CARDIOLOGY CONSULT NOTE  Patient ID: Lawrence Turner MRN: [id number] DOB/AGE: November 13, 1925 83 y.o.  Admit date: 10/07/2016 Referring Physician  Rudean Curt, DO Primary Physician:  Jeanmarie Hubert, MD Reason for Consultation  Orthostatic hypotension and cardiomyopathy  HPI: Lawrence Turner  is a 83 y.o. male  With History of chronic adrenal insufficiency being managed by Dr. Dwyane Dee and presently on chronic hydrocortisone 20 mg p.o. b.i.d., we'll been doing well until December 2017, patient had significant deterioration in his functional capacity, started noticing marked fatigue and dyspnea on exertion.  Since then he has had rapid deterioration in overall well-being.  He also has severe orthostatic hypotension, history of Parkinson's disease and peripheral neuropathy.  Patient denies any history to suggest chest pain, palpitations.  He denies PND or orthopnea but does admit that he will doing minimal activities he gets extremely short-winded fatigued but more importantly he is very dizzy and unsteady on his gait.  He has had episodes of fall and near syncope in the past.  Fortunately he has not had any significant injuries. Was transferred from assisted living facility on 10/07/16 with episode where he was not responding to staff, but with eyes apparently open and gazing straight ahead. There was no convulsive activity noted and after several seconds, the patient was able to squeeze a nurse's hand on command. He returned to his usual state within seconds, but reported feeling very fatigued. On Arrival to the ED, patient was alert and oriented.  CT scan of the head did not reveal any acute abnormality except chronic ischemic changes.  He has had an echocardiogram in September 2017 which was essentially normal with mild diastolic dysfunction with preserved LVEF, however echocardiogram done yesterday had revealed mild to moderate decrease in LVEF at 40-45% with mild pulmonary hypertension with PA pressure 33 mmHg.  Hence  also asked to see the patient.  Past Medical History:  Diagnosis Date  . Abnormality of gait 10/19/2015  . Anemia, unspecified   . Benign neoplasm of colon    pre-cancerous polyps removed at colonoscopy 2008  . Contact dermatitis and other eczema, due to unspecified cause   . Diverticulosis of colon (without mention of hemorrhage)   . Esophageal reflux   . Hypertrophy of prostate without urinary obstruction and other lower urinary tract symptoms (LUTS)   . Mononeuritis of unspecified site   . Orthostatic hypotension 04/18/2016  . Osteoarthrosis, unspecified whether generalized or localized, pelvic region and thigh   . Other B-complex deficiencies   . Other pulmonary embolism and infarction    following cholecystectomy 2003  . Parkinson disease ([place]) 10/19/2015  . Peripheral neuropathy ([place])   . Pure hypercholesterolemia   . Rosacea   . Seizures ([place]) 04/17/2016  . 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 Syncope and collapse 04/18/2016  . Ulcer of foot ([place]) 04/03/2016  . Unspecified constipation   . Unspecified essential hypertension   . Unspecified gastritis and gastroduodenitis without mention of hemorrhage 08/29/2006  . Unspecified hemorrhoids without mention of complication   . Unspecified vitamin D deficiency      Past Surgical History:  Procedure Laterality Date  . CATARACT EXTRACTION W/ INTRAOCULAR LENS IMPLANT Left 5/20//2013  . CHOLECYSTECTOMY, LAPAROSCOPIC  2003  . colonic polyp removal  1965  . COLONOSCOPY  08/29/2006   hemorrhoids, 3 small polyps (hyperplastic) & diverticulosis  . HEMORRHOID SURGERY  1953     Family History  Problem Relation Age of Onset  . Cancer Mother  breast  . Pneumonia Father   . Cancer Brother      Social History: Social History   Social History  . Marital status: Married    Spouse name: [name]  . Number of children: 1  . Years of education: 1   Occupational History  . retired Advertising account executive    Social  History Main Topics  . Smoking status: Former Smoker    Years: 15.00    Types: Cigarettes    Quit date: 01/30/1963  . Smokeless tobacco: Never Used  . Alcohol use 1.2 oz/week    1 Glasses of wine, 1 Cans of beer per week     Comment: 14 per week  . Drug use: No  . Sexual activity: Not on file   Other Topics Concern  . Not on file   Social History Narrative   [facility], 10/19/15 currently in assisted living area   Married [place]   Former smoker stopped 1964   Alcohlol 14 drinks per week   Exercise walks daily   Living Will, POA              ROS: Marked fatigue present, no significant weight change, severe dizziness present, no neurologic deficits, has worsening dyspnea on exertion.  No leg edema.  He has Parkinson's disease.  Other systems negative.    Physical Exam: Blood pressure (!) 176/78, pulse 73, temperature 97.7 F (36.5 C), temperature source Oral, resp. rate 18, height 5' 10"  (1.778 m), weight 143 lb 4.8 oz (65 kg), SpO2 98 %.   Orthostatic VS for the past 24 hrs:  BP- Lying Pulse- Lying BP- Sitting Pulse- Sitting BP- Standing at 0 minutes Pulse- Standing at 0 minutes  10/09/16 1022 183/83 95 144/76 97 93/52 98     General appearance: alert, cooperative, appears stated age and no distress Lungs: clear to auscultation bilaterally Heart: regular rate and rhythm, S1, S2 normal, no murmur, click, rub or gallop Abdomen: soft, non-tender; bowel sounds normal; no masses,  no organomegaly Extremities: extremities normal, atraumatic, no cyanosis or edema Pulses: 2+ and symmetric Neurologic: Grossly normal  Labs:   Lab Results  Component Value Date   WBC 11.3 (H) 10/10/2016   HGB 10.0 (L) 10/10/2016   HCT 30.9 (L) 10/10/2016   MCV 98.7 10/10/2016   PLT 156 10/10/2016    Recent Labs Lab 10/07/16 1454  10/10/16 0519  NA 138  < > 141  K 3.9  < > 3.8  CL 105  < > 109  CO2 22  < > 25  BUN 34*  < > 29*  CREATININE 1.31*  < > 1.07   CALCIUM 8.3*  < > 8.3*  PROT 5.1*  --   --   BILITOT 1.2  --   --   ALKPHOS 37*  --   --   ALT 23  --   --   AST 30  --   --   GLUCOSE 121*  < > 111*  < > = values in this interval not displayed.  Lipid Panel     Component Value Date/Time   CHOL 166 05/24/2014   TRIG 54 05/24/2014   HDL 45 05/24/2014   LDLCALC 110 05/24/2014    BNP (last 3 results)  Recent Labs  10/07/16 1522  BNP 481.2*    Recent Labs  10/07/16 2100 10/08/16 0312 10/08/16 0952  TROPONINI 0.08* 0.07* 0.07*    Recent Labs  04/19/16 0434 04/24/16 1517 10/07/16 2000  TSH 2.286 2.297 0.918   EKG 10/07/2016: Normal sinus rhythm at rate of 89 bpm, normal axis, voltage criteria for LVH.  Nonspecific T wave inversion in high lateral leads, suggests LVH with repolarization.  Normal QT interval.  Echocardiogram 10/09/2016: Normal LV size, mild to moderate global hypokinesis, EF 47-09%, grade 1 diastolic dysfunction.  Trace aortic regurgitation, mild left atrial enlargement, mild tricuspid regurgitation, PA pressure 33 mmHg.  Mild increase in CVP.  Compared to echocardiogram done on 04/20/2016, low LVEF new, previously 55-60%.   Radiology: Ct Chest Wo Contrast  Result Date: 10/08/2016 CLINICAL DATA:  Shortness of breath. Lung nodule. Abnormal chest x-ray. EXAM: CT CHEST WITHOUT CONTRAST TECHNIQUE: Multidetector CT imaging of the chest was performed following the standard protocol without IV contrast. COMPARISON:  None. FINDINGS: Cardiovascular: The heart size is normal. Blood pool is hypodense, compatible with known anemia. Coronary artery calcifications are present. The aorta and pulmonary arteries are within normal limits. Mediastinum/Nodes: No significant mediastinal or axillary adenopathy is present. The thoracic inlet is normal. The esophagus is dilated throughout its course in the chest. Lungs/Pleura: Centrilobular emphysema is present. There is no focal lung nodule to correspond to the finding on the chest  x-ray. Finding corresponds with overlap of a posterior right rib and the scapula. Linear scarring or atelectasis is present at the left base. No significant pleural effusion or pneumothorax is present. Upper Abdomen: Cholecystectomy is noted. Limited imaging the upper abdomen demonstrates moderate distention of the stomach. No other focal lesions are present. Musculoskeletal: Fusion of anterior osteophytes throughout the thoracic spine is compatible with DISH. Vertebral body heights are maintained. No focal lytic or blastic lesions are evident. IMPRESSION: 1. No pulmonary nodule.  The finding was likely artifactual. 2. Mild emphysema. 3. Mild atherosclerotic cysts including coronary artery disease. 4. Dilation of the esophagus throughout the mediastinum suggesting esophageal dysmotility. No mass lesion is present. 5. Fusion of anterior osteophytes compatible with DISH. Electronically Signed   By: San Morelle M.D.   On: 10/08/2016 20:35   Scheduled Meds: . carbidopa-levodopa  1 tablet Oral TID  . DULoxetine  30 mg Oral Daily  . enoxaparin (LOVENOX) injection  40 mg Subcutaneous Q24H  . famotidine  20 mg Oral Daily  . feeding supplement  1 Container Oral Q24H  . feeding supplement (ENSURE ENLIVE)  237 mL Oral Q24H  . feeding supplement (PRO-STAT SUGAR FREE 64)  30 mL Oral TID WC  . hydrocortisone  10 mg Oral QHS  . hydrocortisone  20 mg Oral Daily  . magnesium oxide  200 mg Oral Daily  . megestrol  200 mg Oral Daily  . midodrine  2.5 mg Oral Q2000  . midodrine  5 mg Oral BID WC  . psyllium  1 packet Oral Daily  . sodium chloride flush  3 mL Intravenous Q12H  . cyanocobalamin  1,000 mcg Oral Daily   Continuous Infusions: PRN Meds:.acetaminophen, cyclobenzaprine, HYDROcodone-acetaminophen, labetalol, lactulose, ondansetron **OR** ondansetron (ZOFRAN) IV, polyethylene glycol, zinc oxide  ASSESSMENT AND PLAN:  1.  Orthostatic hypotension and supine hypertension related to secondary  autonomic insufficiency from Parkinson's disease, also has chronic adrenal insufficiency and on chronic steroid therapy. 2.  Syncope vs ?Seizure 10/07/16.  3. Cardiomyopathy, mild to moderate decrease in LVEF without clinical evidence of overt congestive heart failure although BNP is elevated, patient has noticed dyspnea starting Dec 2017 with general decline in health. Suggests acute on chronic diastolic  heart failure to be the etiology for dyspnea. 4.  Coronary calcification  by CT scan of the chest dated 10/08/2016.  Also noted during CT scan was esophageal dysmotility with esophageal dilatation throughout the mediastinum. 5.  Anemia and severe hypoproteinemia, total protein 5.1, albumin 2.9. 5.  Chronic renal insufficiency, stage III C q.d. eGFR 47 mL.  Recommendation: Supine hypertension and orthostatic hypotension is going to be extremely difficult to treat.  He will have to accept some degree of supine hypertension.  Would recommend that the sleep with a wedge in the back to decreased the effects of supine hypertension.  Would also check random serum cortisol level today.  With regard to diastolic heart failure, treatment this will be extremely difficult with Rosana Hoes is, patient can certainly develop worsening symptoms of orthostasis.  Also by CT scan of the chest performed on 10/08/16, this did not reveal any significant evidence of CHF.  Fludrocortisone may also precipitate acute diastolic heart failure.  Other option would be to start the patient on Pyridostigmine 30 mg TID while awake along with flurinef adn midodrine.  However I'll like to see how he does on midodrine that has recently been started.  I would recommend that we proceed with ischemic evaluation although he has not had any chest pain marked decrease in LVEF, patient's son very concerned, patient prior to the hospitalization was relatively independent and ambulatory until December 2017.  Unless it is severely ischemic or high risk, I  have discussed with his son that I prefer to do conservative therapy only.  Will start ambulation and check orthostatics with Midodrine.   Adrian Prows, MD 10/10/2016, 10:08 AM [facility] [place] Pager: [phone] Office: [phone] If no answer Cell [phone]

## 2016-10-10 NOTE — Clinical Social Work Note (Signed)
Clinical Social Work Assessment  Patient Details  Name: Lawrence Turner MRN: [id number] Date of Birth: [birth year]  Date of referral:  10/10/16               Reason for consult:  Discharge Planning                Permission sought to share information with:  Facility Sport and exercise psychologist, Family Supports Permission granted to share information::  Yes, Verbal Permission Granted  Name::     Kelsen Keenan  Agency::  [facility]  Relationship::  Son  Contact Information:  [phone]  Housing/Transportation Living arrangements for the past 2 months:  [address] of Information:  Medical Team, Adult Children Patient Interpreter Needed:  None Criminal Activity/Legal Involvement Pertinent to Current Situation/Hospitalization:  No - Comment as needed Significant Relationships:  Adult Children, Spouse Lives with:  Spouse, Facility Resident Do you feel safe going back to the place where you live?  Yes Need for family participation in patient care:  Yes (Comment)  Care giving concerns:  PT recommending SNF once medically stable for discharge. Patient is from [facility].   Social Worker assessment / plan:  Patient not fully oriented and had staff in the room. CSW called patient's son. CSW introduced role and explained that PT recommendations would be discussed. Patient's son confirmed that he was admitted from [facility] and is agreeable to patient transitioning to the SNF side of the facility for short-term rehab. Patient's son is aware that patient would have to be hospitalized for at least one day to meet Medicare requirements for SNF. No further concerns. CSW encouraged patient's son to contact CSW as needed. CSW will continue to follow patient and his family for support and facilitate discharge to SNF once medically stable.  Employment status:  Retired Forensic scientist:  Medicare PT Recommendations:  [place] /  Referral to community resources:  [place]  Patient/Family's Response to care:  Patient not fully oriented. Patient's son agreeable to him transitioning to SNF side of [facility]. Patient's family supportive and involved in patient's care. Patient's son appreciated social work intervention.  Patient/Family's Understanding of and Emotional Response to Diagnosis, Current Treatment, and Prognosis:  Patient not fully oriented. Patient's son has a good understanding of the reason for admission. Patient's son appears happy with hospital care.  Emotional Assessment Appearance:  Appears stated age Attitude/Demeanor/Rapport:  Unable to Assess Affect (typically observed):  Unable to Assess Orientation:  Oriented to Self, Oriented to Place Alcohol / Substance use:  Never Used Psych involvement (Current and /or in the community):  No (Comment)  Discharge Needs  Concerns to be addressed:  Care Coordination Readmission within the last 30 days:  No Current discharge risk:  Cognitively Impaired, Dependent with Mobility Barriers to Discharge:  Continued Medical Work up, Other (3-day inpatient stay: On day 2.)   Candie Chroman, LCSW 10/10/2016, 11:10 AM

## 2016-10-10 NOTE — NC FL2 (Signed)
[place] LEVEL OF CARE SCREENING TOOL     IDENTIFICATION  Patient Name: Lawrence Turner Birthdate: Apr 02, 1926 Sex: male Admission Date (Current Location): 10/07/2016  [facility] and [place] Number:  Herbalist and Address:  [facility] [place], [place] [address], [place], [place] [address]      Provider Number: [id number]  Attending Physician Name and Address:  Geradine Girt, DO  Relative Name and Phone Number:       Current Level of Care: Hospital Recommended Level of Care: [place] Prior Approval Number:    Date Approved/Denied:   PASRR Number: [id number]  Discharge Plan: SNF    Current Diagnoses: Patient Active Problem List   Diagnosis Date Noted  . Malnutrition of moderate degree 10/08/2016  . Transient alteration of awareness 10/08/2016  . Elevated troponin   . Hypertensive urgency 10/07/2016  . Altered awareness, transient 10/07/2016  . Hypokalemia 10/02/2016  . B12 deficiency 09/24/2016  . Malaise and fatigue 08/21/2016  . CHF (congestive heart failure) ([place]) 05/22/2016  . Pressure ulcer, stage 2 04/21/2016  . Secondary adrenal insufficiency ([place]) 04/21/2016  . Syncope 04/19/2016  . Orthostatic hypotension 04/18/2016  . Seizures ([place]) 04/17/2016  . Ulcer of foot ([place]) 04/03/2016  . Weight loss 01/24/2016  . Hammer toe of left foot 10/25/2015  . Abnormality of gait 10/19/2015  . Left hip pain 08/09/2015  . Edema 03/15/2015  . Constipation 10/05/2014  . Corn 10/05/2014  . Parkinson's disease ([place]) 02/02/2014  . Uncontrolled hypertension   . Esophageal reflux   . Peripheral neuropathy ([place])   . BPH (benign prostatic hyperplasia)   . Other B-complex deficiencies   . Normocytic anemia   . Unspecified vitamin D deficiency   . Pure hypercholesterolemia   . Benign neoplasm of colon     Orientation RESPIRATION BLADDER Height & Weight     Self, Place  Normal Incontinent, External catheter Weight: 143 lb  4.8 oz (65 kg) Height:  5\' 10"  (177.8 cm)  BEHAVIORAL SYMPTOMS/MOOD NEUROLOGICAL BOWEL NUTRITION STATUS   (None) Convulsions/Seizures Continent Diet (Heart healthy)  AMBULATORY STATUS COMMUNICATION OF NEEDS Skin   Limited Assist Verbally Skin abrasions, Bruising, Other (Comment), PU Stage and Appropriate Care (Skin tear)   PU Stage 2 Dressing:  (Buttocks: Foam)                   Personal Care Assistance Level of Assistance              Functional Limitations Info  Sight, Hearing, Speech Sight Info: Adequate Hearing Info: Adequate Speech Info: Adequate    SPECIAL CARE FACTORS FREQUENCY  PT (By licensed PT), Blood pressure     PT Frequency: 5 x week              Contractures Contractures Info: Not present    Additional Factors Info  Code Status, Allergies Code Status Info: DNR Allergies Info: Lyrica (Pregabalin)           Current Medications (10/10/2016):  This is the current hospital active medication list Current Facility-Administered Medications  Medication Dose Route Frequency Provider Last Rate Last Dose  . acetaminophen (TYLENOL) tablet 650 mg  650 mg Oral Q6H PRN Vianne Bulls, MD      . carbidopa-levodopa (SINEMET CR) 50-200 MG per tablet controlled release 1 tablet  1 tablet Oral TID Vianne Bulls, MD   1 tablet at 10/10/16 1045  . cyclobenzaprine (FLEXERIL) tablet 5 mg  5  mg Oral TID PRN Vianne Bulls, MD      . DULoxetine (CYMBALTA) DR capsule 30 mg  30 mg Oral Daily Vianne Bulls, MD   30 mg at 10/10/16 1044  . enoxaparin (LOVENOX) injection 40 mg  40 mg Subcutaneous Q24H Vianne Bulls, MD   40 mg at 10/09/16 2304  . famotidine (PEPCID) tablet 20 mg  20 mg Oral Daily Vianne Bulls, MD   20 mg at 10/10/16 1044  . feeding supplement (BOOST / RESOURCE BREEZE) liquid 1 Container  1 Container Oral Q24H Geradine Girt, DO   1 Container at 10/09/16 1200  . feeding supplement (ENSURE ENLIVE) (ENSURE ENLIVE) liquid 237 mL  237 mL Oral Q24H Geradine Girt, DO   237 mL at 10/09/16 2309  . feeding supplement (PRO-STAT SUGAR FREE 64) liquid 30 mL  30 mL Oral TID WC Ilene Qua Opyd, MD   30 mL at 10/10/16 1044  . HYDROcodone-acetaminophen (NORCO/VICODIN) 5-325 MG per tablet 1-2 tablet  1-2 tablet Oral Q4H PRN Vianne Bulls, MD      . hydrocortisone (CORTEF) tablet 10 mg  10 mg Oral QHS Vianne Bulls, MD   10 mg at 10/09/16 2304  . hydrocortisone (CORTEF) tablet 20 mg  20 mg Oral Daily Vianne Bulls, MD   20 mg at 10/10/16 1045  . labetalol (NORMODYNE,TRANDATE) injection 5 mg  5 mg Intravenous Q10 min PRN Vianne Bulls, MD   5 mg at 10/07/16 1946  . lactulose (CHRONULAC) 10 GM/15ML solution 40 g  40 g Oral Daily PRN Vianne Bulls, MD      . magnesium oxide (MAG-OX) tablet 200 mg  200 mg Oral Daily Geradine Girt, DO   200 mg at 10/10/16 1044  . megestrol (MEGACE) 400 MG/10ML suspension 200 mg  200 mg Oral Daily Vianne Bulls, MD   200 mg at 10/10/16 1044  . midodrine (PROAMATINE) tablet 2.5 mg  2.5 mg Oral Q2000 Geradine Girt, DO   2.5 mg at 10/09/16 2303  . midodrine (PROAMATINE) tablet 5 mg  5 mg Oral BID WC Jessica U Vann, DO   5 mg at 10/08/16 1700  . ondansetron (ZOFRAN) tablet 4 mg  4 mg Oral Q6H PRN Vianne Bulls, MD       Or  . ondansetron (ZOFRAN) injection 4 mg  4 mg Intravenous Q6H PRN Ilene Qua Opyd, MD      . polyethylene glycol (MIRALAX / GLYCOLAX) packet 17 g  17 g Oral Daily PRN Geradine Girt, DO      . psyllium (HYDROCIL/METAMUCIL) packet 1 packet  1 packet Oral Daily Vianne Bulls, MD   1 packet at 10/10/16 1045  . sodium chloride flush (NS) 0.9 % injection 3 mL  3 mL Intravenous Q12H Ilene Qua Opyd, MD   3 mL at 10/10/16 1000  . vitamin B-12 (CYANOCOBALAMIN) tablet 1,000 mcg  1,000 mcg Oral Daily Vianne Bulls, MD   1,000 mcg at 10/10/16 1044  . zinc oxide 20 % ointment 1 application  1 application Topical BID PRN Vianne Bulls, MD         Discharge Medications: Please see discharge summary for a list of discharge  medications.  Relevant Imaging Results:  Relevant Lab Results:   Additional Information SS#: [id number]  Candie Chroman, LCSW

## 2016-10-10 NOTE — Consult Note (Signed)
Consultation Note Date: 10/10/2016   Patient Name: Lawrence Turner  DOB: July 05, 1926  MRN: [id number]  Age / Sex: 83 y.o., male  PCP: Estill Dooms, MD Referring Physician: Geradine Girt, DO  Reason for Consultation: Establishing goals of care  HPI/Patient Profile: 83 y.o. male with past medical history of Parkinson's disease with associated autonomic dysfunction and labile blood pressures, adrenal insufficiency, questionable seizure history, GERD, and chronic constipation who presented to the ED after a brief period of transient loss of consciousness. He was subsequently admitted on 10/07/2016 for evaluation. Work-up so far has indicated transient hypotension as likely cause of episode. EEG showed slowing but no seizure activity. Heat CT negative for acute pathology. BP very labile likely secondary to PD and autonomic dysfunction, with adrenal insufficiency likely contributory. Midodrine initiated with florinef stopped with resultant hypertension when supine. Cardiology also consulted for new dCHF; plan for ischemic evaluation and monitoring on midodrine.  Clinical Assessment and Goals of Care: I had a very long conversation with Lawrence Turner, the patient's son, over the phone. He gave extensive history of his father's health issues, and recent decline. Specifically noting the minimal clarity on rationale for decline, or aggressive investigation into potential venues for improvement. He has been increasingly frustrated by the persistent reticence of his father's providers to heed his concerns, and feels like he is singularly having to drive good care for his father. Through our conversation it became clear that his goals are to continue to treat what is treatable, with the expectation of thorough investigation into root cause, and consideration of interventions that could improve his functional autonomy.   In terms of Palliative Care, I explained the role Hospice could  play in his care--but explained that the Hospice philosophy is focused on symptom management and promotion of quality of life. As his goal is to continue investigating potential root cause/s of decline and treating what is treatable, Hospice would not be appropriate at this juncture. For now, the biggest symptom concern is Lawrence Turner anxiety and possible depression. His son relates that his father has significant worries and concerns, and these thoughts tend to circle and cause dysfunction in how he interacts with the world. He has been on psychiatric medications, including most recently Cymbalta and Remeron, however these have not had a discernable impact, per Lawrence Turner. I explained that I would evaluate his record and current medications, and look into treatment modalities. He did ask that I run them by him before making any changes, which I agreed I would.  Primary Decision Maker PATIENT; pt's son is very active in his father's care.    SUMMARY OF RECOMMENDATIONS    DNR, otherwise treat what is treatable  I am hesitant to add/change his current psych medications given the duration for effect, the potential for being lost to follow-up after discharge, and his significant BP lability and PD medication. In talking with Lawrence Turner I suspect the most benefit would be through consistent counseling and support, such as with CBT. I have reached out to [facility] and found they do have a Psychiatric Nurse on staff who could provide "talk therapy," as well as clinical social workers who could help set-up more involved/more frequent support if needed. Finally, Lawrence Turner does qualify for [facility], which has an excellent Palliative Care program through [facility], which may enable more reliable support over time. I will plan to discuss these options with [name] tomorrow.   Code Status/Advance Care Planning:  DNR  Symptom Management:   Pt's biggest  complaint is muscle soreness in his thighs, which he  believes is from increased use with therapy. Will start a basic muscle rub (Bengay equivalent).  Psycho-social/Spiritual:   Desire for further Chaplaincy support:no  Additional Recommendations: Caregiving  Support/Resources and Referral to [employer]   Prognosis:   Unable to determine  Discharge Planning: Lawrence Turner for rehab with Palliative care service follow-up      Primary Diagnoses: Present on Admission: . Pressure ulcer, stage 2 . Orthostatic hypotension . Normocytic anemia . Parkinson's disease ([place]) . Esophageal reflux . Secondary adrenal insufficiency ([place]) . Hypertensive urgency . Altered awareness, transient . (Resolved) Transient alteration of awareness . Transient alteration of awareness   I have reviewed the medical record, interviewed the patient and family, and examined the patient. The following aspects are pertinent.  Past Medical History:  Diagnosis Date  . Abnormality of gait 10/19/2015  . Anemia, unspecified   . Benign neoplasm of colon    pre-cancerous polyps removed at colonoscopy 2008  . Contact dermatitis and other eczema, due to unspecified cause   . Diverticulosis of colon (without mention of hemorrhage)   . Esophageal reflux   . Hypertrophy of prostate without urinary obstruction and other lower urinary tract symptoms (LUTS)   . Mononeuritis of unspecified site   . Orthostatic hypotension 04/18/2016  . Osteoarthrosis, unspecified whether generalized or localized, pelvic region and thigh   . Other B-complex deficiencies   . Other pulmonary embolism and infarction    following cholecystectomy 2003  . Parkinson disease ([place]) 10/19/2015  . Peripheral neuropathy ([place])   . Pure hypercholesterolemia   . Rosacea   . Seizures ([place]) 04/17/2016  . 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 Syncope and collapse 04/18/2016  . Ulcer of foot ([place]) 04/03/2016  . Unspecified constipation   . Unspecified  essential hypertension   . Unspecified gastritis and gastroduodenitis without mention of hemorrhage 08/29/2006  . Unspecified hemorrhoids without mention of complication   . Unspecified vitamin D deficiency    Social History   Social History  . Marital status: Married    Spouse name: [name]  . Number of children: 1  . Years of education: 22   Occupational History  . retired Advertising account executive    Social History Main Topics  . Smoking status: Former Smoker    Years: 15.00    Types: Cigarettes    Quit date: 01/30/1963  . Smokeless tobacco: Never Used  . Alcohol use 1.2 oz/week    1 Glasses of wine, 1 Cans of beer per week     Comment: 14 per week  . Drug use: No  . Sexual activity: Not Asked   Other Topics Concern  . None   Social History Narrative   Lives at [facility] since 01/20/2014, 10/19/15 currently in assisted living area   Married [place]   Former smoker stopped 1964   Alcohlol 14 drinks per week   Exercise walks daily   Living Will, POA            Family History  Problem Relation Age of Onset  . Cancer Mother     breast  . Pneumonia Father   . Cancer Brother    Scheduled Meds: . carbidopa-levodopa  1 tablet Oral TID  . DULoxetine  30 mg Oral Daily  . enoxaparin (LOVENOX) injection  40 mg Subcutaneous Q24H  . famotidine  20 mg Oral Daily  . feeding supplement  1 Container Oral Q24H  .  feeding supplement (ENSURE ENLIVE)  237 mL Oral Q24H  . feeding supplement (PRO-STAT SUGAR FREE 64)  30 mL Oral TID WC  . hydrocortisone  10 mg Oral QHS  . hydrocortisone  20 mg Oral Daily  . magnesium oxide  200 mg Oral Daily  . megestrol  200 mg Oral Daily  . midodrine  2.5 mg Oral Q2000  . midodrine  5 mg Oral BID WC  . psyllium  1 packet Oral Daily  . sodium chloride flush  3 mL Intravenous Q12H  . cyanocobalamin  1,000 mcg Oral Daily   Continuous Infusions: PRN Meds:.acetaminophen, cyclobenzaprine, HYDROcodone-acetaminophen, labetalol, lactulose, ondansetron **OR**  ondansetron (ZOFRAN) IV, polyethylene glycol, zinc oxide Allergies  Allergen Reactions  . Lyrica [Pregabalin]     *Anticonvulsants*, fatique   Review of Systems  -Biggest concern is muscle soreness across thighs. Feels tired and weak.  Physical Exam  Constitutional: He is oriented to person, place, and time. He appears cachectic. He has a sickly appearance.  HENT:  Head: Normocephalic and atraumatic.  Mouth/Throat: No oropharyngeal exudate.  Eyes: EOM are normal.  Neck: Normal range of motion.  Cardiovascular: Normal rate.   Pulmonary/Chest: Effort normal. No respiratory distress.  Abdominal: Soft.  Musculoskeletal: Normal range of motion.  Muscle atrophy noted in all extremities. Pill rolling at rest.  Neurological: He is alert and oriented to person, place, and time.  Skin: Skin is warm and dry. Bruising noted. There is pallor.  Psychiatric: Judgment and thought content normal. His speech is delayed. He is slowed and withdrawn. Cognition and memory are normal.  Flat affect.     Vital Signs: BP (!) 184/93 (BP Location: Left Arm)   Pulse 91   Temp 97.8 F (36.6 C) (Oral)   Resp 18   Ht 5\' 10"  (1.778 m)   Wt 65 kg (143 lb 4.8 oz)   SpO2 97%   BMI 20.56 kg/m  Pain Assessment: No/denies pain   Pain Score: 0-No pain   SpO2: SpO2: 97 % O2 Device:SpO2: 97 % O2 Flow Rate: .   IO: Intake/output summary:  Intake/Output Summary (Last 24 hours) at 10/10/16 1422 Last data filed at 10/10/16 1101  Gross per 24 hour  Intake              480 ml  Output              500 ml  Net              -20 ml    LBM: Last BM Date: 10/08/16 Baseline Weight: Weight: 62.1 kg (136 lb 14.4 oz) (Pt unable to stand. ) Most recent weight: Weight: 65 kg (143 lb 4.8 oz)     Palliative Assessment/Data: PPS 50-60%    Time In/Out: 1315/1515 Time Total: 120 minutes Greater than 50%  of this time was spent counseling and coordinating care related to the above assessment and plan.  Signed  by: Charlynn Court, NP Palliative Medicine Team  Pager # [phone] (M-F 7a-5p) Team Phone # [phone] (Nights/Weekends)

## 2016-10-11 ENCOUNTER — Inpatient Hospital Stay (HOSPITAL_COMMUNITY): Payer: Medicare Other

## 2016-10-11 LAB — GLUCOSE, CAPILLARY: GLUCOSE-CAPILLARY: 95 mg/dL (ref 65–99)

## 2016-10-11 LAB — CORTISOL-AM, BLOOD: Cortisol - AM: 47.2 ug/dL — ABNORMAL HIGH (ref 6.7–22.6)

## 2016-10-11 MED ORDER — TECHNETIUM TC 99M TETROFOSMIN IV KIT
10.0000 | PACK | Freq: Once | INTRAVENOUS | Status: AC | PRN
  Administered 2016-10-11: 10 via INTRAVENOUS

## 2016-10-11 MED ORDER — DOCUSATE SODIUM 100 MG PO CAPS
100.0000 mg | ORAL_CAPSULE | Freq: Every day | ORAL | Status: DC
  Administered 2016-10-11 – 2016-10-12 (×2): 100 mg via ORAL
  Filled 2016-10-11 (×2): qty 1

## 2016-10-11 MED ORDER — TECHNETIUM TC 99M TETROFOSMIN IV KIT
30.0000 | PACK | Freq: Once | INTRAVENOUS | Status: AC | PRN
  Administered 2016-10-11: 30 via INTRAVENOUS

## 2016-10-11 MED ORDER — REGADENOSON 0.4 MG/5ML IV SOLN
INTRAVENOUS | Status: AC
  Administered 2016-10-11: 0.4 mg via INTRAVENOUS
  Filled 2016-10-11: qty 5

## 2016-10-11 MED ORDER — REGADENOSON 0.4 MG/5ML IV SOLN
0.4000 mg | Freq: Once | INTRAVENOUS | Status: AC
  Administered 2016-10-11: 0.4 mg via INTRAVENOUS
  Filled 2016-10-11: qty 5

## 2016-10-11 NOTE — Progress Notes (Addendum)
Subjective:  Patient for nuclear stress this morning. Was constipated. Physical therapy not started yet. His SBP supine noted.   Objective:  Vital Signs in the last 24 hours: Temp:  [97.6 F (36.4 C)-98.4 F (36.9 C)] 98.4 F (36.9 C) (02/22 0545) Pulse Rate:  [73-91] 73 (02/22 0545) Resp:  [18] 18 (02/22 0545) BP: (181-187)/(87-93) 187/87 (02/22 0545) SpO2:  [97 %-100 %] 99 % (02/22 0545) Weight:  [141 lb (64 kg)] 141 lb (64 kg) (02/22 0545)  Intake/Output from previous day: 02/21 0701 - 02/22 0700 In: 840 [P.O.:840] Out: 1470 [Urine:1470]  Physical Exam: General appearance: alert, cooperative, appears stated age and no distress Lungs: clear to auscultation bilaterally Heart: regular rate and rhythm, S1, S2 normal, no murmur, click, rub or gallop Abdomen: soft, non-tender; bowel sounds normal; no masses,  no organomegaly Extremities: extremities normal, atraumatic, no cyanosis or edema Pulses: 2+ and symmetric Neurologic: Grossly normal  Lab Results: BMP  Recent Labs  10/08/16 0312 10/09/16 0536 10/10/16 0519  NA 142 142 141  K 3.2* 3.4* 3.8  CL 106 110 109  CO2 24 24 25   GLUCOSE 167* 120* 111*  BUN 26* 31* 29*  CREATININE 1.38* 1.04 1.07  CALCIUM 8.2* 8.5* 8.3*  GFRNONAA 44* >60 59*  GFRAA 51* >60 >60    CBC  Recent Labs Lab 10/10/16 0519  WBC 11.3*  RBC 3.13*  HGB 10.0*  HCT 30.9*  PLT 156  MCV 98.7  MCH 31.9  MCHC 32.4  RDW 16.3*    Recent Labs  10/07/16 2100 10/08/16 0312 10/08/16 0952  TROPONINI 0.08* 0.07* 0.07*    Recent Labs  04/19/16 0434 04/24/16 1517 10/07/16 2000  TSH 2.286 2.297 0.918    Recent Labs  05/08/16 1653  05/24/16 1500 08/23/16 10/07/16 1454  PROT 5.2*  --  5.6*  --  5.1*  ALBUMIN 3.2*  --  3.0*  --  2.9*  AST 27  < > 29 14 30   ALT 30  < > 10 6* 23  ALKPHOS 39  < > 39 26 37*  BILITOT 0.3  --  0.5  --  1.2  < > = values in this interval not displayed.  Imaging: Imaging results have been  reviewed  Cardiac Studies:  EKG 10/07/2016: Normal sinus rhythm at rate of 89 bpm, normal axis, voltage criteria for LVH.  Nonspecific T wave inversion in high lateral leads, suggests LVH with repolarization.  Normal QT interval.  Echocardiogram 10/09/2016: Normal LV size, mild to moderate global hypokinesis, EF 53-66%, grade 1 diastolic dysfunction.  Trace aortic regurgitation, mild left atrial enlargement, mild tricuspid regurgitation, PA pressure 33 mmHg.  Mild increase in CVP.  Compared to echocardiogram done on 04/20/2016, low LVEF new, previously 55-60%  Assessment/Plan:  1.  Orthostatic hypotension and supine hypertension related to secondary autonomic insufficiency from Parkinson's disease, also has chronic adrenal insufficiency and on chronic steroid therapy. 2.  Syncope vs ?Seizure 10/07/16.  3. Acute on chronic diastolic  heart failure to be the etiology for dyspnea. 4.  Coronary calcification by CT scan of the chest dated 10/08/2016.  Also noted during CT scan was esophageal dysmotility with esophageal dilatation throughout the mediastinum. 5.  Anemia and severe hypoproteinemia, total protein 5.1, albumin 2.9. 5.  Chronic renal insufficiency, stage III  eGFR 47 mL. 6. Constipation.   Rec: I am not too concerned about BP being high supine. We have to accept supine hypertensino. Will wait on nuclear images to determine etiology, suspect non  ischemic. I am not sure this is contributing to orthostasis, but suspect may be contrubiting to acute diastolic heart failure. Also S. Cortosol level is too high and may need to decrease the dose. Will add am cortosol for tomorrow and will send message to Dr. Dwyane Dee regarding his opinion on dosing of steroids.  Addition of pyridostigmine will depend on his ability to maintain orthostatic pressure. He does not want to wear support stockings due to skin tear from previous experience.   Added soap suds enema x1  and colace for 3 days   Adrian Prows,  M.D. 10/11/2016, 10:34 AM [facility] Pager: [phone] Office: [phone] If no answer: [phone]

## 2016-10-11 NOTE — Progress Notes (Signed)
   10/11/16 0545  Vitals  Temp 98.4 F (36.9 C)  Temp Source Oral  BP (!) 187/87  BP Location Right Arm  BP Method Automatic  Patient Position (if appropriate) Lying  Pulse Rate 73  Pulse Rate Source Dinamap  Resp 18  Oxygen Therapy  SpO2 99 %  O2 Device Room Air  Height and Weight  Weight 64 kg (141 lb)  Type of Scale Used Bed  Pt's BP still high, MD aware.

## 2016-10-11 NOTE — Progress Notes (Signed)
Pt about passed out on [place] while RN and tech getting orthos, while standing up, 168/87 HR 77 lying , sitting 108/59, HR 82, then standing 79/61 HR 85, again pt almost passed out , but once we got pt back in bed pt started feeling better and BP went back to 174/92, HR 80, pt does not need to stand up due to this, MD notified will continue to monitor, THanks, Arvella Nigh RN

## 2016-10-11 NOTE — Progress Notes (Signed)
PT Cancellation Note  Patient Details Name: Lawrence Turner MRN: [id number] DOB: 1925/09/15   Cancelled Treatment:    Reason Eval/Treat Not Completed: Patient at procedure or test/unavailable.  Will try later as time and pt allow.   Ramond Dial 10/11/2016, 10:30 AM   Mee Hives, PT MS Acute Rehab Dept. Number: [place] [facility] [place]

## 2016-10-11 NOTE — Progress Notes (Signed)
PROGRESS NOTE    Lawrence Turner  [id number] DOB: 1925/12/12 DOA: 10/07/2016 PCP: Jeanmarie Hubert, MD   Outpatient Specialists:     Brief Narrative:  Lawrence Turner is a 83 y.o. male with medical history significant for Parkinson's disease, adrenal insufficiency, questionable seizure history, GERD, and chronic constipation who presents from his nursing facility for evaluation of a transient altered level of consciousness. Patient has been following with endocrinology for adrenal insufficiency, and with neurology for Parkinson's disease, and his course has been complicated by apparent autonomic dysfunction and labile blood pressures. He has had multiple episodes involving transient loss of consciousness and today at the nursing facility, had an episode where he was not responding to staff, but with eyes apparently open and gazing straight ahead.  Appears to be related to orthostatic BP. Family says that since December, patient has been sleeping more and much weaker.  Here patient was found to have a decreased EF and cardiac work up with Dr. Einar Gip is in process.  Assessment & Plan:   Principal Problem:   Altered awareness, transient Active Problems:   Esophageal reflux   Normocytic anemia   Parkinson's disease (HCC)   Seizures (HCC)   Orthostatic hypotension   Pressure ulcer, stage 2   Secondary adrenal insufficiency (HCC)   Hypertensive urgency   Elevated troponin   Malnutrition of moderate degree   Transient alteration of awareness   Goals of care, counseling/discussion   Palliative care by specialist   Transient ALOC - no further episodes in hospital - Pt presents following a transient episode at his nursing home in which he was not responding, but with eyes open and "staring off;" he returned to usual state within seconds to a minute and had no complaints other than fatigue - does not appear to be seizures -EEG shows slowing but no seizure acitivity - Per his family at bedside, he has  had multiple such episodes - Head CT is negative for acute pathology and patient is in his usual state on admission  - BP is very labile and there is significant orthostasis; suspect transient hypotension as the cause of his episodes  Orthostatic hypotension, labile BP - This has been an ongoing problem, likely related to PD and autonomic dysfunction, with adrenal insufficiency possibly contributing  - Per recent endocrinology notes, steroid was increased and midodrine was being considered  - echo: Left ventricle: The cavity size was normal. Wall thickness was   increased in a pattern of moderate LVH. Systolic function was   mildly to moderately reduced. The estimated ejection fraction was   in the range of 40% to 45%. Diffuse hypokinesis. Doppler   parameters are consistent with abnormal left ventricular   relaxation (grade 1 diastolic dysfunction). -appreciate cardiology consult -midodrine added after discussion with Dr. Dwyane Dee on phone  Parkinson disease  - Appears to be stable, concerned for autonomic dysfunction as above, continue Sinemet   -outpatient neuro follow up for medication changes  Adrenal insufficiency   -appreciate Dr. Dwyane Dee-- stop florinef-- add midodrine 5 mg 2x/day and 2.5 mg in evening -continue cortef  Acute kidney injury  - SCr is 1.31 on admission, up from apparent baseline of 1.0  - Likely a prerenal azotemia given his clinical dehydration and transient hypotension  -d/c lasix  Elevated troponin  - Troponin slightly elevated on admission to 0.06  - There is no anginal complaint on admission and this likely reflects demand ischemia in setting of transient episodes of hypotension  -troponins flat  GERD -  Stable, no EGD report on file  - Managed with Pepcid at home, will continue   Normocytic anemia - Hgb is 11.3 on admission and stable relative to recent priors  - No sign of bleeding, likely secondary to chronic disease  - Continue B12  supplementation   Esophageal dysmotility -SLP to make diet adjustments -suspect related to parkinson's disease  Decreased EF -high risk stress test -defer to cardiology  Discussed palliative care consult with son--- had multiple questions about hospice and other items that I could not answer-- he is requesting a call to himself before meeting with father   DVT prophylaxis:  Lovenox   Code Status: DNR   Family Communication: Son at bedside  Disposition Plan:  SNF    Consultants:   cards     Subjective: Just back from ST and having a bowel movement  Objective: Vitals:   10/11/16 1040 10/11/16 1042 10/11/16 1044 10/11/16 1045  BP: (!) 202/93 (!) 234/81 (!) 182/78   Pulse: 95 95 93 90  Resp:      Temp:      TempSrc:      SpO2:      Weight:      Height:        Intake/Output Summary (Last 24 hours) at 10/11/16 1317 Last data filed at 10/11/16 1153  Gross per 24 hour  Intake              600 ml  Output             2070 ml  Net            -1470 ml   Filed Weights   10/09/16 0625 10/10/16 0437 10/11/16 0545  Weight: 64.2 kg (141 lb 8 oz) 65 kg (143 lb 4.8 oz) 64 kg (141 lb)    Examination:  General exam: flat facies  Respiratory system: Clear to auscultation. Respiratory effort normal. Cardiovascular system: S1 & S2 heard, RRR. No JVD, murmurs, rubs, gallops or clicks. No pedal edema. Gastrointestinal system: Abdomen is nondistended, soft and nontender. No organomegaly or masses felt. Normal bowel sounds heard. Central nervous system: Alert- speech difficult to understand     Data Reviewed: I have personally reviewed following labs and imaging studies  CBC:  Recent Labs Lab 10/07/16 1454 10/09/16 0536 10/10/16 0519  WBC 12.5* 13.7* 11.3*  HGB 11.3* 10.6* 10.0*  HCT 34.3* 32.4* 30.9*  MCV 97.4 98.5 98.7  PLT 163 167 [id number]   Basic Metabolic Panel:  Recent Labs Lab 10/07/16 1454 10/08/16 0312 10/08/16 1200 10/09/16 0536 10/10/16 0519   NA 138 142  --  142 141  K 3.9 3.2*  --  3.4* 3.8  CL 105 106  --  110 109  CO2 22 24  --  24 25  GLUCOSE 121* 167*  --  120* 111*  BUN 34* 26*  --  31* 29*  CREATININE 1.31* 1.38*  --  1.04 1.07  CALCIUM 8.3* 8.2*  --  8.5* 8.3*  MG  --   --  1.9  --   --    GFR: Estimated Creatinine Clearance: 42.4 mL/min (by C-G formula based on SCr of 1.07 mg/dL). Liver Function Tests:  Recent Labs Lab 10/07/16 1454  AST 30  ALT 23  ALKPHOS 37*  BILITOT 1.2  PROT 5.1*  ALBUMIN 2.9*   No results for input(s): LIPASE, AMYLASE in the last 168 hours. No results for input(s): AMMONIA in the last 168 hours. Coagulation Profile:  Recent Labs Lab 10/07/16 1454  INR 1.13   Cardiac Enzymes:  Recent Labs Lab 10/07/16 1717 10/07/16 2100 10/08/16 0312 10/08/16 0952  TROPONINI 0.06* 0.08* 0.07* 0.07*   BNP (last 3 results) No results for input(s): PROBNP in the last 8760 hours. HbA1C: No results for input(s): HGBA1C in the last 72 hours. CBG:  Recent Labs Lab 10/08/16 0557 10/09/16 0628 10/10/16 0618 10/11/16 0622  GLUCAP 167* 111* 117* 95   Lipid Profile: No results for input(s): CHOL, HDL, LDLCALC, TRIG, CHOLHDL, LDLDIRECT in the last 72 hours. Thyroid Function Tests: No results for input(s): TSH, T4TOTAL, FREET4, T3FREE, THYROIDAB in the last 72 hours. Anemia Panel: No results for input(s): VITAMINB12, FOLATE, FERRITIN, TIBC, IRON, RETICCTPCT in the last 72 hours. Urine analysis:    Component Value Date/Time   COLORURINE YELLOW 10/07/2016 1438   APPEARANCEUR CLEAR 10/07/2016 1438   LABSPEC 1.009 10/07/2016 1438   PHURINE 6.0 10/07/2016 1438   GLUCOSEU NEGATIVE 10/07/2016 1438   HGBUR NEGATIVE 10/07/2016 1438   [place] 10/07/2016 1438   [place] 10/07/2016 1438   PROTEINUR NEGATIVE 10/07/2016 1438   NITRITE NEGATIVE 10/07/2016 [place] 10/07/2016 1438     ) Recent Results (from the past 240 hour(s))  MRSA PCR  Screening     Status: None   Collection Time: 10/08/16  6:01 AM  Result Value Ref Range Status   MRSA by PCR NEGATIVE NEGATIVE Final    Comment:        The GeneXpert MRSA Assay (FDA approved for NASAL specimens only), is one component of a comprehensive MRSA colonization surveillance program. It is not intended to diagnose MRSA infection nor to guide or monitor treatment for MRSA infections.       Anti-infectives    None       Radiology Studies: Nm Myocar Multi W/spect W/wall Motion / Ef  Result Date: 10/11/2016 CLINICAL DATA:  Congestive heart failure.  Hypertension. EXAM: MYOCARDIAL IMAGING WITH SPECT (REST AND EXERCISE) GATED LEFT VENTRICULAR WALL MOTION STUDY LEFT VENTRICULAR EJECTION FRACTION TECHNIQUE: Standard myocardial SPECT imaging was performed after resting intravenous injection of 10 mCi Tc-60m tetrofosmin. Subsequently, exercise tolerance test was performed by the patient under the supervision of the Cardiology staff. At peak-stress, 30 mCi Tc-49m tetrofosmin was injected intravenously and standard myocardial SPECT imaging was performed. Quantitative gated imaging was also performed to evaluate left ventricular wall motion, and estimate left ventricular ejection fraction. COMPARISON:  CT the chest 10/08/2016. FINDINGS: Perfusion: There is a fixed defect at the base PA reversible ischemia is noted in the anterior wall and septum. Wall Motion: Global hypokinesia is present. The left ventricle is not enlarged. Left Ventricular Ejection Fraction: 23 % End diastolic volume 123456 ml End systolic volume 81 ml IMPRESSION: 1. Reversible ischemia within the anterior wall and septum. The fixed infarct is present along the base. 2. Global hypokinesia is present. 3. Left ventricular ejection fraction 23% 4. Non invasive risk stratification*: High risk *2012 Appropriate Use Criteria for Coronary Revascularization Focused Update: J Am Coll Cardiol. N6492421.  http://content.airportbarriers.com.aspx?articleid=[email or web address] Electronically Signed   By: San Morelle M.D.   On: 10/11/2016 12:10        Scheduled Meds: . carbidopa-levodopa  1 tablet Oral TID  . docusate sodium  100 mg Oral Daily  . DULoxetine  30 mg Oral Daily  . enoxaparin (LOVENOX) injection  40 mg Subcutaneous Q24H  . famotidine  20 mg Oral Daily  . feeding supplement  1 Container Oral Q24H  .  feeding supplement (ENSURE ENLIVE)  237 mL Oral Q24H  . feeding supplement (PRO-STAT SUGAR FREE 64)  30 mL Oral TID WC  . hydrocortisone  10 mg Oral QHS  . hydrocortisone  20 mg Oral Daily  . magnesium oxide  200 mg Oral Daily  . megestrol  200 mg Oral Daily  . midodrine  2.5 mg Oral Q2000  . midodrine  5 mg Oral BID WC  . psyllium  1 packet Oral Daily  . sodium chloride flush  3 mL Intravenous Q12H  . cyanocobalamin  1,000 mcg Oral Daily   Continuous Infusions:   LOS: 3 days    Time spent: 25 min    [place], DO [facility] Pager [phone]  If 7PM-7AM, please contact night-coverage [email or web address] Password [email or web address] 10/11/2016, 1:17 PM

## 2016-10-11 NOTE — Progress Notes (Signed)
Daily Progress Note   Patient Name: Lawrence Turner       Date: 10/11/2016 DOB: [birth year]  Age: 83 y.o. MRN#: [id number] Attending Physician: Geradine Girt, DO Primary Care Physician: Jeanmarie Hubert, MD Admit Date: 10/07/2016  Reason for Consultation/Follow-up: Non pain symptom management and Psychosocial/spiritual support  Subjective: Mr. Noftz was lethargic post cardiac imaging. He has no acute complaints, but was wondering if he could have lunch. He had been NPO for the nuclear imaging. His son was at the bedside. He was also concerned about his lunch tray not having arrived yet.   Length of Stay: 3  Current Medications: Scheduled Meds:  . carbidopa-levodopa  1 tablet Oral TID  . docusate sodium  100 mg Oral Daily  . DULoxetine  30 mg Oral Daily  . enoxaparin (LOVENOX) injection  40 mg Subcutaneous Q24H  . famotidine  20 mg Oral Daily  . feeding supplement  1 Container Oral Q24H  . feeding supplement (ENSURE ENLIVE)  237 mL Oral Q24H  . feeding supplement (PRO-STAT SUGAR FREE 64)  30 mL Oral TID WC  . hydrocortisone  10 mg Oral QHS  . hydrocortisone  20 mg Oral Daily  . magnesium oxide  200 mg Oral Daily  . megestrol  200 mg Oral Daily  . midodrine  2.5 mg Oral Q2000  . midodrine  5 mg Oral BID WC  . psyllium  1 packet Oral Daily  . sodium chloride flush  3 mL Intravenous Q12H  . cyanocobalamin  1,000 mcg Oral Daily    Continuous Infusions:   PRN Meds: acetaminophen, cyclobenzaprine, HYDROcodone-acetaminophen, labetalol, lactulose, MUSCLE RUB, ondansetron **OR** ondansetron (ZOFRAN) IV, polyethylene glycol, zinc oxide  Physical Exam    Constitutional: He is oriented to person, place, and time. He appears cachectic. He has a sickly appearance.  Lethargic. HENT:  Head: Normocephalic and atraumatic.  Mouth/Throat: No  oropharyngeal exudate.  Eyes: EOM are normal.  Neck: Normal range of motion.  Cardiovascular: Normal rate.   Pulmonary/Chest: Effort normal. No respiratory distress.  Abdominal: Soft.  Musculoskeletal: Normal range of motion.  Muscle atrophy noted in all extremities. Pill rolling at rest on right hand.  Neurological: He is alert and oriented to person, place, and time.  Skin: Skin is warm and dry. Bruising noted. There is pallor.  Psychiatric: Judgment and thought content normal. His speech is delayed. He is slowed and withdrawn. Cognition and memory are normal.  Flat affect.         Vital Signs: BP (!) 182/78 (BP Location: Right Arm)   Pulse 90 Comment: test end  Temp 98.4 F (36.9 C) (Oral)   Resp 18   Ht 5\' 10"  (1.778 m)   Wt 64 kg (141 lb)   SpO2 99%   BMI 20.23 kg/m  SpO2: SpO2: 99 % O2 Device: O2 Device: Not Delivered O2 Flow Rate:    Intake/output summary:  Intake/Output Summary (Last 24 hours) at 10/11/16 1208 Last data filed at 10/11/16 1153  Gross per 24 hour  Intake              600 ml  Output             2070 ml  Net            -1470 ml   LBM: Last BM Date: 10/08/16 Baseline Weight: Weight: 62.1 kg (136 lb 14.4 oz) (Pt unable to stand. ) Most recent weight: Weight: 64 kg (141 lb)  Palliative Assessment/Data: PPS 40%   Flowsheet Rows   Flowsheet Row Most Recent Value  Intake Tab  Referral Department  Hospitalist  Unit at Time of Referral  Cardiac/Telemetry Unit  Palliative Care Primary Diagnosis  Neurology  Date Notified  10/10/16  Palliative Care Type  New Palliative care  Reason for referral  Clarify Goals of Care  Date of Admission  10/07/16  Date first seen by Palliative Care  10/10/16  # of days Palliative referral response time  0 Day(s)  # of days IP prior to Palliative referral  3  Clinical Assessment  Psychosocial & Spiritual Assessment  Palliative Care Outcomes      Patient Active Problem List   Diagnosis Date Noted  . Goals of  care, counseling/discussion   . Palliative care by specialist   . Malnutrition of moderate degree 10/08/2016  . Transient alteration of awareness 10/08/2016  . Elevated troponin   . Hypertensive urgency 10/07/2016  . Altered awareness, transient 10/07/2016  . Hypokalemia 10/02/2016  . B12 deficiency 09/24/2016  . Malaise and fatigue 08/21/2016  . CHF (congestive heart failure) ([place]) 05/22/2016  . Pressure ulcer, stage 2 04/21/2016  . Secondary adrenal insufficiency ([place]) 04/21/2016  . Syncope 04/19/2016  . Orthostatic hypotension 04/18/2016  . Seizures ([place]) 04/17/2016  . Ulcer of foot ([place]) 04/03/2016  . Weight loss 01/24/2016  . Hammer toe of left foot 10/25/2015  . Abnormality of gait 10/19/2015  . Left hip pain 08/09/2015  . Edema 03/15/2015  . Constipation 10/05/2014  . Corn 10/05/2014  . Parkinson's disease ([place]) 02/02/2014  . Uncontrolled hypertension   . Esophageal reflux   . Peripheral neuropathy ([place])   . BPH (benign prostatic hyperplasia)   . Other B-complex deficiencies   . Normocytic anemia   . Unspecified vitamin D deficiency   . Pure hypercholesterolemia   . Benign neoplasm of colon     Palliative Care Assessment & Plan   HPI: 83 y.o. male with past medical history of Parkinson's disease with associated autonomic dysfunction and labile blood pressures, adrenal insufficiency, questionable seizure history, GERD, and chronic constipation who presented to the ED after a brief period of transient loss of consciousness. He was subsequently admitted on 10/07/2016 for evaluation. Work-up so far has indicated transient hypotension as likely cause of episode. EEG showed slowing but no seizure activity. Heat CT negative for acute pathology. BP very labile likely secondary to PD and autonomic dysfunction, with adrenal insufficiency likely contributory. Midodrine initiated with florinef stopped with resultant hypertension when supine. Cardiology also consulted for new dCHF;  plan for ischemic evaluation and monitoring on midodrine.  Assessment: Please see my initial consult note on 2/21 for full details of my conversation with Ozzie Hoyle (the patient's son). In brief, Ozzie Hoyle was primarily concerned with his father's anxiety and possible depression. He was open to recommendations for management and ongoing support.  In reviewing Mr. Lieske's medication and medication history there have been three agents utilized, Remeron (15mg ), Lexapro 10mg , Cymbalta 20mg , and now Cymbalta 30mg . All of these medications were seemingly initiated for a combination of pain/neuropathy, sleep, and mood. That said, there has been no clear titration of the medications and Ozzie Hoyle has felt they were ineffective. Currently, Mr. Showell is undergoing cardiac  work-up, his medications for PD and adrenal insufficiency are being adjusted, and he continues to struggle with supine hypertension and orthostatic hypotension. In considering these things I recommended limiting the pharmacologic addition/change of medication, and instead pursuing CBT as a modality to help manage his anxiety. I discussed these things with Ozzie Hoyle, who was open to this option. I then discussed it with Mr. Pebley, who was also open to meeting with a therapist to work through some of his anxiety. I called [facility] and spoke with the social worker, Ellin Mayhew (?spelling). She is going to investigate therapists in the area and work to coordinate a referral and transportation for after discharge.   In terms of longitudinal support, I also would like Mr. Ging to have ongoing good Palliative Care. He is eligible for [place] based on his insurance, however [facility] does not work with patients in facilities (which [facility]isted [facility] counts as, per their nurse coordinator). Consequently, I would recommend Palliative Care through [facility], which is serviced through [facility].  Recommendations/Plan:  DNR, otherwise treat what is  treatable  [facility] SW setting up outpatient therapist for CBT for anxiety  Would recommend Palliative on discharge, through [facility]  Code Status:  DNR  Prognosis:   Unable to determine  Discharge Planning:  [place] for rehab with Palliative care service follow-up  Care plan was discussed with pt and pt's son.  Thank you for allowing the Palliative Medicine Team to assist in the care of this patient.   Time in/out: 0940/0950; 1150/1250 Total time: 90 minutes    Greater than 50%  of this time was spent counseling and coordinating care related to the above assessment and plan.  Charlynn Court, NP Palliative Medicine Team [phone] pager (7a-5p) Team Phone # [phone]

## 2016-10-12 DIAGNOSIS — J811 Chronic pulmonary edema: Secondary | ICD-10-CM | POA: Diagnosis not present

## 2016-10-12 DIAGNOSIS — R0602 Shortness of breath: Secondary | ICD-10-CM | POA: Diagnosis not present

## 2016-10-12 DIAGNOSIS — L97509 Non-pressure chronic ulcer of other part of unspecified foot with unspecified severity: Secondary | ICD-10-CM | POA: Diagnosis not present

## 2016-10-12 DIAGNOSIS — I509 Heart failure, unspecified: Secondary | ICD-10-CM | POA: Diagnosis not present

## 2016-10-12 DIAGNOSIS — E2740 Unspecified adrenocortical insufficiency: Secondary | ICD-10-CM | POA: Diagnosis not present

## 2016-10-12 DIAGNOSIS — K219 Gastro-esophageal reflux disease without esophagitis: Secondary | ICD-10-CM | POA: Diagnosis present

## 2016-10-12 DIAGNOSIS — G908 Other disorders of autonomic nervous system: Secondary | ICD-10-CM | POA: Diagnosis not present

## 2016-10-12 DIAGNOSIS — R531 Weakness: Secondary | ICD-10-CM | POA: Diagnosis not present

## 2016-10-12 DIAGNOSIS — I5030 Unspecified diastolic (congestive) heart failure: Secondary | ICD-10-CM | POA: Diagnosis not present

## 2016-10-12 DIAGNOSIS — I5042 Chronic combined systolic (congestive) and diastolic (congestive) heart failure: Secondary | ICD-10-CM | POA: Diagnosis present

## 2016-10-12 DIAGNOSIS — L84 Corns and callosities: Secondary | ICD-10-CM | POA: Diagnosis not present

## 2016-10-12 DIAGNOSIS — Z79899 Other long term (current) drug therapy: Secondary | ICD-10-CM | POA: Diagnosis not present

## 2016-10-12 DIAGNOSIS — I517 Cardiomegaly: Secondary | ICD-10-CM | POA: Diagnosis not present

## 2016-10-12 DIAGNOSIS — E2749 Other adrenocortical insufficiency: Secondary | ICD-10-CM | POA: Diagnosis not present

## 2016-10-12 DIAGNOSIS — R634 Abnormal weight loss: Secondary | ICD-10-CM | POA: Diagnosis not present

## 2016-10-12 DIAGNOSIS — L89312 Pressure ulcer of right buttock, stage 2: Secondary | ICD-10-CM | POA: Diagnosis present

## 2016-10-12 DIAGNOSIS — E559 Vitamin D deficiency, unspecified: Secondary | ICD-10-CM | POA: Diagnosis present

## 2016-10-12 DIAGNOSIS — Z515 Encounter for palliative care: Secondary | ICD-10-CM | POA: Diagnosis not present

## 2016-10-12 DIAGNOSIS — F458 Other somatoform disorders: Secondary | ICD-10-CM | POA: Diagnosis not present

## 2016-10-12 DIAGNOSIS — M6281 Muscle weakness (generalized): Secondary | ICD-10-CM | POA: Diagnosis not present

## 2016-10-12 DIAGNOSIS — R40241 Glasgow coma scale score 13-15, unspecified time: Secondary | ICD-10-CM | POA: Diagnosis present

## 2016-10-12 DIAGNOSIS — M2042 Other hammer toe(s) (acquired), left foot: Secondary | ICD-10-CM | POA: Diagnosis not present

## 2016-10-12 DIAGNOSIS — R55 Syncope and collapse: Secondary | ICD-10-CM | POA: Diagnosis not present

## 2016-10-12 DIAGNOSIS — D126 Benign neoplasm of colon, unspecified: Secondary | ICD-10-CM | POA: Diagnosis not present

## 2016-10-12 DIAGNOSIS — I959 Hypotension, unspecified: Secondary | ICD-10-CM | POA: Diagnosis not present

## 2016-10-12 DIAGNOSIS — Z7409 Other reduced mobility: Secondary | ICD-10-CM | POA: Diagnosis not present

## 2016-10-12 DIAGNOSIS — Z66 Do not resuscitate: Secondary | ICD-10-CM | POA: Diagnosis present

## 2016-10-12 DIAGNOSIS — R0600 Dyspnea, unspecified: Secondary | ICD-10-CM | POA: Diagnosis not present

## 2016-10-12 DIAGNOSIS — E7800 Pure hypercholesterolemia, unspecified: Secondary | ICD-10-CM | POA: Diagnosis not present

## 2016-10-12 DIAGNOSIS — J90 Pleural effusion, not elsewhere classified: Secondary | ICD-10-CM | POA: Diagnosis not present

## 2016-10-12 DIAGNOSIS — N400 Enlarged prostate without lower urinary tract symptoms: Secondary | ICD-10-CM | POA: Diagnosis present

## 2016-10-12 DIAGNOSIS — M25571 Pain in right ankle and joints of right foot: Secondary | ICD-10-CM | POA: Diagnosis not present

## 2016-10-12 DIAGNOSIS — E873 Alkalosis: Secondary | ICD-10-CM | POA: Diagnosis present

## 2016-10-12 DIAGNOSIS — R269 Unspecified abnormalities of gait and mobility: Secondary | ICD-10-CM | POA: Diagnosis not present

## 2016-10-12 DIAGNOSIS — E46 Unspecified protein-calorie malnutrition: Secondary | ICD-10-CM | POA: Diagnosis not present

## 2016-10-12 DIAGNOSIS — R609 Edema, unspecified: Secondary | ICD-10-CM | POA: Diagnosis not present

## 2016-10-12 DIAGNOSIS — R42 Dizziness and giddiness: Secondary | ICD-10-CM | POA: Diagnosis not present

## 2016-10-12 DIAGNOSIS — L89152 Pressure ulcer of sacral region, stage 2: Secondary | ICD-10-CM | POA: Diagnosis not present

## 2016-10-12 DIAGNOSIS — Z9981 Dependence on supplemental oxygen: Secondary | ICD-10-CM | POA: Diagnosis not present

## 2016-10-12 DIAGNOSIS — R1312 Dysphagia, oropharyngeal phase: Secondary | ICD-10-CM | POA: Diagnosis not present

## 2016-10-12 DIAGNOSIS — I951 Orthostatic hypotension: Secondary | ICD-10-CM | POA: Diagnosis not present

## 2016-10-12 DIAGNOSIS — R404 Transient alteration of awareness: Secondary | ICD-10-CM | POA: Diagnosis not present

## 2016-10-12 DIAGNOSIS — R918 Other nonspecific abnormal finding of lung field: Secondary | ICD-10-CM | POA: Diagnosis not present

## 2016-10-12 DIAGNOSIS — T444X5A Adverse effect of predominantly alpha-adrenoreceptor agonists, initial encounter: Secondary | ICD-10-CM | POA: Diagnosis present

## 2016-10-12 DIAGNOSIS — J810 Acute pulmonary edema: Secondary | ICD-10-CM | POA: Diagnosis not present

## 2016-10-12 DIAGNOSIS — I5020 Unspecified systolic (congestive) heart failure: Secondary | ICD-10-CM | POA: Diagnosis not present

## 2016-10-12 DIAGNOSIS — Z87891 Personal history of nicotine dependence: Secondary | ICD-10-CM | POA: Diagnosis not present

## 2016-10-12 DIAGNOSIS — R5383 Other fatigue: Secondary | ICD-10-CM | POA: Diagnosis not present

## 2016-10-12 DIAGNOSIS — I110 Hypertensive heart disease with heart failure: Secondary | ICD-10-CM | POA: Diagnosis present

## 2016-10-12 DIAGNOSIS — R569 Unspecified convulsions: Secondary | ICD-10-CM | POA: Diagnosis not present

## 2016-10-12 DIAGNOSIS — R479 Unspecified speech disturbances: Secondary | ICD-10-CM | POA: Diagnosis not present

## 2016-10-12 DIAGNOSIS — R748 Abnormal levels of other serum enzymes: Secondary | ICD-10-CM | POA: Diagnosis not present

## 2016-10-12 DIAGNOSIS — Z7189 Other specified counseling: Secondary | ICD-10-CM | POA: Diagnosis not present

## 2016-10-12 DIAGNOSIS — I160 Hypertensive urgency: Secondary | ICD-10-CM | POA: Diagnosis not present

## 2016-10-12 DIAGNOSIS — Z888 Allergy status to other drugs, medicaments and biological substances status: Secondary | ICD-10-CM | POA: Diagnosis not present

## 2016-10-12 DIAGNOSIS — R4182 Altered mental status, unspecified: Secondary | ICD-10-CM | POA: Diagnosis not present

## 2016-10-12 DIAGNOSIS — Z8711 Personal history of peptic ulcer disease: Secondary | ICD-10-CM | POA: Diagnosis not present

## 2016-10-12 DIAGNOSIS — R262 Difficulty in walking, not elsewhere classified: Secondary | ICD-10-CM | POA: Diagnosis not present

## 2016-10-12 DIAGNOSIS — I5023 Acute on chronic systolic (congestive) heart failure: Secondary | ICD-10-CM | POA: Diagnosis not present

## 2016-10-12 DIAGNOSIS — I348 Other nonrheumatic mitral valve disorders: Secondary | ICD-10-CM | POA: Diagnosis not present

## 2016-10-12 DIAGNOSIS — I351 Nonrheumatic aortic (valve) insufficiency: Secondary | ICD-10-CM | POA: Diagnosis not present

## 2016-10-12 DIAGNOSIS — E538 Deficiency of other specified B group vitamins: Secondary | ICD-10-CM | POA: Diagnosis not present

## 2016-10-12 DIAGNOSIS — D649 Anemia, unspecified: Secondary | ICD-10-CM | POA: Diagnosis not present

## 2016-10-12 DIAGNOSIS — E271 Primary adrenocortical insufficiency: Secondary | ICD-10-CM | POA: Diagnosis not present

## 2016-10-12 DIAGNOSIS — E440 Moderate protein-calorie malnutrition: Secondary | ICD-10-CM | POA: Diagnosis not present

## 2016-10-12 DIAGNOSIS — R410 Disorientation, unspecified: Secondary | ICD-10-CM | POA: Diagnosis not present

## 2016-10-12 DIAGNOSIS — I361 Nonrheumatic tricuspid (valve) insufficiency: Secondary | ICD-10-CM | POA: Diagnosis not present

## 2016-10-12 DIAGNOSIS — I674 Hypertensive encephalopathy: Secondary | ICD-10-CM | POA: Diagnosis present

## 2016-10-12 DIAGNOSIS — I5033 Acute on chronic diastolic (congestive) heart failure: Secondary | ICD-10-CM | POA: Diagnosis not present

## 2016-10-12 DIAGNOSIS — K5909 Other constipation: Secondary | ICD-10-CM | POA: Diagnosis present

## 2016-10-12 DIAGNOSIS — L89322 Pressure ulcer of left buttock, stage 2: Secondary | ICD-10-CM | POA: Diagnosis present

## 2016-10-12 DIAGNOSIS — I340 Nonrheumatic mitral (valve) insufficiency: Secondary | ICD-10-CM | POA: Diagnosis not present

## 2016-10-12 DIAGNOSIS — K5900 Constipation, unspecified: Secondary | ICD-10-CM | POA: Diagnosis not present

## 2016-10-12 DIAGNOSIS — I255 Ischemic cardiomyopathy: Secondary | ICD-10-CM | POA: Diagnosis present

## 2016-10-12 DIAGNOSIS — D539 Nutritional anemia, unspecified: Secondary | ICD-10-CM | POA: Diagnosis present

## 2016-10-12 DIAGNOSIS — D519 Vitamin B12 deficiency anemia, unspecified: Secondary | ICD-10-CM | POA: Diagnosis not present

## 2016-10-12 DIAGNOSIS — G9340 Encephalopathy, unspecified: Secondary | ICD-10-CM | POA: Diagnosis not present

## 2016-10-12 DIAGNOSIS — R2681 Unsteadiness on feet: Secondary | ICD-10-CM | POA: Diagnosis not present

## 2016-10-12 DIAGNOSIS — G629 Polyneuropathy, unspecified: Secondary | ICD-10-CM | POA: Diagnosis present

## 2016-10-12 DIAGNOSIS — G903 Multi-system degeneration of the autonomic nervous system: Secondary | ICD-10-CM | POA: Diagnosis not present

## 2016-10-12 DIAGNOSIS — I161 Hypertensive emergency: Secondary | ICD-10-CM | POA: Diagnosis present

## 2016-10-12 DIAGNOSIS — G459 Transient cerebral ischemic attack, unspecified: Secondary | ICD-10-CM | POA: Diagnosis not present

## 2016-10-12 DIAGNOSIS — R9431 Abnormal electrocardiogram [ECG] [EKG]: Secondary | ICD-10-CM | POA: Diagnosis not present

## 2016-10-12 DIAGNOSIS — I10 Essential (primary) hypertension: Secondary | ICD-10-CM | POA: Diagnosis not present

## 2016-10-12 DIAGNOSIS — G20 Parkinson's disease: Secondary | ICD-10-CM | POA: Diagnosis not present

## 2016-10-12 DIAGNOSIS — I5022 Chronic systolic (congestive) heart failure: Secondary | ICD-10-CM | POA: Diagnosis not present

## 2016-10-12 DIAGNOSIS — F0280 Dementia in other diseases classified elsewhere without behavioral disturbance: Secondary | ICD-10-CM | POA: Diagnosis present

## 2016-10-12 DIAGNOSIS — I501 Left ventricular failure: Secondary | ICD-10-CM | POA: Diagnosis not present

## 2016-10-12 DIAGNOSIS — M79671 Pain in right foot: Secondary | ICD-10-CM | POA: Diagnosis not present

## 2016-10-12 DIAGNOSIS — J9601 Acute respiratory failure with hypoxia: Secondary | ICD-10-CM | POA: Diagnosis not present

## 2016-10-12 LAB — BASIC METABOLIC PANEL
ANION GAP: 6 (ref 5–15)
BUN: 22 mg/dL — ABNORMAL HIGH (ref 6–20)
CALCIUM: 8.1 mg/dL — AB (ref 8.9–10.3)
CO2: 25 mmol/L (ref 22–32)
Chloride: 103 mmol/L (ref 101–111)
Creatinine, Ser: 0.96 mg/dL (ref 0.61–1.24)
GLUCOSE: 105 mg/dL — AB (ref 65–99)
Potassium: 3.3 mmol/L — ABNORMAL LOW (ref 3.5–5.1)
Sodium: 134 mmol/L — ABNORMAL LOW (ref 135–145)

## 2016-10-12 LAB — CBC
HCT: 34.4 % — ABNORMAL LOW (ref 39.0–52.0)
Hemoglobin: 11.3 g/dL — ABNORMAL LOW (ref 13.0–17.0)
MCH: 32 pg (ref 26.0–34.0)
MCHC: 32.8 g/dL (ref 30.0–36.0)
MCV: 97.5 fL (ref 78.0–100.0)
Platelets: 170 10*3/uL (ref 150–400)
RBC: 3.53 MIL/uL — ABNORMAL LOW (ref 4.22–5.81)
RDW: 15.5 % (ref 11.5–15.5)
WBC: 14.1 10*3/uL — AB (ref 4.0–10.5)

## 2016-10-12 LAB — GLUCOSE, CAPILLARY: Glucose-Capillary: 103 mg/dL — ABNORMAL HIGH (ref 65–99)

## 2016-10-12 MED ORDER — MIDODRINE HCL 5 MG PO TABS
5.0000 mg | ORAL_TABLET | Freq: Two times a day (BID) | ORAL | Status: DC
Start: 2016-10-12 — End: 2016-11-20

## 2016-10-12 MED ORDER — HYDROCORTISONE 10 MG PO TABS: 10.0000 mg | ORAL_TABLET | Freq: Every day | ORAL | Status: DC

## 2016-10-12 MED ORDER — POTASSIUM CHLORIDE CRYS ER 20 MEQ PO TBCR
40.0000 meq | EXTENDED_RELEASE_TABLET | Freq: Once | ORAL | Status: AC
  Administered 2016-10-12: 40 meq via ORAL
  Filled 2016-10-12: qty 2

## 2016-10-12 MED ORDER — HYDROCORTISONE 20 MG PO TABS: 20.0000 mg | ORAL_TABLET | Freq: Every day | ORAL | Status: DC

## 2016-10-12 MED ORDER — CYCLOBENZAPRINE HCL 5 MG PO TABS: 5.0000 mg | ORAL_TABLET | Freq: Three times a day (TID) | ORAL | 0 refills | Status: DC | PRN

## 2016-10-12 MED ORDER — MAGNESIUM OXIDE 400 (241.3 MG) MG PO TABS: 200.0000 mg | ORAL_TABLET | Freq: Every day | ORAL | Status: DC

## 2016-10-12 NOTE — Progress Notes (Signed)
Son called and asked for MD [name] to call him with an update. Paged MD [name].  Pearce [place] Leory Plowman

## 2016-10-12 NOTE — Progress Notes (Signed)
Report given to [facility] at [facility]. Pt telemetry removed, condom cath and peripheral Ivs, catheters intact. AVS, DNR form signed and printer prescriptions ready for transport.   Amore Grater Leory Plowman

## 2016-10-12 NOTE — Care Management Important Message (Signed)
Important Message  Patient Details  Name: Lawrence Turner MRN: [id number] Date of Birth: 1925/11/09   Medicare Important Message Given:  Yes    Nathen May 10/12/2016, 2:04 PM

## 2016-10-12 NOTE — Progress Notes (Signed)
Paged MD regarding pt having 9 beats of V tach while working with physical therapy  Lawrence Turner

## 2016-10-12 NOTE — Progress Notes (Signed)
MD [name] returned page, aware of pt recent blood pressures. MD stated do not give any PRN medications such as labetalol. Asked MD for parameters on midodrine, MD stated to give. MD stated they are trying to raise pt blood pressure  Lawrence Turner Leory Plowman

## 2016-10-12 NOTE — Progress Notes (Signed)
MD [name] aware of Lawrence Turner, stated to remove telemetry   [place]

## 2016-10-12 NOTE — Progress Notes (Signed)
Physical Therapy Treatment Patient Details Name: Lawrence Turner MRN: [id number] DOB: 1926/04/27 Today's Date: 10/12/2016    History of Present Illness 83 yo admitted with AMS and lability from [facility]. Pt with PMHx: parkinsons, adrenal insufficiency, questionable seizure history    PT Comments    Pt participated well with exercise and ambulation following until BP dropped below an appropriate threshold and then he was symptomatic.  On BSC after amb and stool, BP low 60's/ mid 30's with HR in the 50's bpm,  1-2 minutes to resolve sitting BSC.   Follow Up Recommendations  Supervision/Assistance - 24 hour;SNF     Equipment Recommendations  None recommended by PT    Recommendations for Other Services       Precautions / Restrictions Precautions Precautions: Fall    Mobility  Bed Mobility Overal bed mobility: Needs Assistance Bed Mobility: Supine to Sit     Supine to sit: Mod assist     General bed mobility comments: assist to move legs off of bed and elevate trunk from surface  Transfers Overall transfer level: Needs assistance   Transfers: Sit to/from Stand Sit to Stand: Min assist         General transfer comment: cues for hand placement and safety with assist to rise  Ambulation/Gait Ambulation/Gait assistance: Min guard;Mod assist;+2 safety/equipment Ambulation Distance (Feet): 130 Feet Assistive device: Rolling walker (2 wheeled) Gait Pattern/deviations: Shuffle;Trunk flexed Gait velocity: slower   General Gait Details: pt started more boldly with steady shuffled gait and ended with need for stool and drop in responsiveness due to significantly decreased BP's   Stairs            Wheelchair Mobility    Modified Rankin (Stroke Patients Only)       Balance Overall balance assessment: Needs assistance Sitting-balance support: Single extremity supported;Bilateral upper extremity supported Sitting balance-Leahy Scale: Fair     Standing  balance support: Bilateral upper extremity supported Standing balance-Leahy Scale: Poor Standing balance comment: reliant on RW and/or external support.  Degradation with decrease in BP                    Cognition Arousal/Alertness: Awake/alert Behavior During Therapy: Flat affect Overall Cognitive Status: Impaired/Different from baseline       Memory: Decreased short-term memory              Exercises General Exercises - Lower Extremity Hip ABduction/ADduction: AROM;Strengthening;AAROM;Both;10 reps;Supine Straight Leg Raises: AROM;AAROM;Strengthening;Both;10 reps;Supine Hip Flexion/Marching: AROM;Strengthening;Both;10 reps;Supine (graded resistance) Other Exercises Other Exercises: biceps/triceps pressess x 10 reps.    General Comments        Pertinent Vitals/Pain Pain Assessment: Faces Faces Pain Scale: No hurt    Home Living                      Prior Function            PT Goals (current goals can now be found in the care plan section) Acute Rehab PT Goals Patient Stated Goal: be able to move PT Goal Formulation: With patient Time For Goal Achievement: 10/23/16 Potential to Achieve Goals: Fair    Frequency    Min 3X/week      PT Plan Current plan remains appropriate    Co-evaluation             End of Session   Activity Tolerance: Patient tolerated treatment well Patient left: in bed;with call bell/phone within reach Nurse Communication: Mobility status;Precautions PT Visit  Diagnosis: Difficulty in walking, not elsewhere classified (R26.2);Muscle weakness (generalized) (M62.81)     Time: [id number] PT Time Calculation (min) (ACUTE ONLY): 35 min  Charges:  $Gait Training: 8-22 mins $Therapeutic Exercise: 8-22 mins                    G CodesTessie Fass Shareef Eddinger 10/12/2016, 12:31 PM 10/12/2016  Donnella Sham, [place] [phone]  (pager)

## 2016-10-12 NOTE — Clinical Social Work Note (Signed)
CSW facilitated patient discharge including contacting patient family and facility to confirm patient discharge plans. Clinical information faxed to facility and family agreeable with plan. CSW arranged ambulance transport via [facility] to [facility]. RN to call report prior to discharge ([phone]).  CSW will sign off for now as social work intervention is no longer needed. Please consult us again if new needs arise.  Margherita Collyer, CSW [phone]  

## 2016-10-12 NOTE — Discharge Instructions (Signed)
An appointment has been made with Durene Romans (CBT Therapist) for March 19th at 10:30am. His office is at [address], [place], [place] [address]. His office number is [phone].

## 2016-10-12 NOTE — Progress Notes (Signed)
Pt worked with physical therapy. Pt became symptomatic, blood pressure dropped and heart rate. Paged MD to inform. MD aware stated this is an on going issue, stated to push oral fluids and that this will not affect discharge    Lois Slagel Leory Plowman

## 2016-10-12 NOTE — Discharge Summary (Addendum)
Physician Discharge Summary  Lawrence Turner [id number] DOB: [birth year] DOA: 10/07/2016  PCP: Jeanmarie Hubert, MD  Admit date: 10/07/2016 Discharge date: 10/12/2016   Recommendations for Outpatient Follow-Up:   Cbc, BMP 1 week  Do not worry about/treat supine blood pressures Midodrine to be given at 8 am and again at 1PM Fall precautions/orthostatic precautions -may need wheelchair only  DNR Palliative care to follow at SNF     Discharge Diagnosis:   Principal Problem:   Altered awareness, transient Active Problems:   Esophageal reflux   Normocytic anemia   Parkinson's disease (HCC)   Seizures (HCC)   Orthostatic hypotension   Pressure ulcer, stage 2   Secondary adrenal insufficiency (HCC)   Hypertensive urgency   Elevated troponin   Malnutrition of moderate degree   Transient alteration of awareness   Goals of care, counseling/discussion   Palliative care by specialist   CHF (congestive heart failure), NYHA class I, acute on chronic, diastolic ([place])   Discharge disposition:  SNF:  Discharge Condition: Improved.  Diet recommendation: Low sodium, heart healthy.  Wound care: None.   History of Present Illness:   Lawrence Turner is a 83 y.o. male with medical history significant for Parkinson's disease, adrenal insufficiency, questionable seizure history, GERD, and chronic constipation who presents from his nursing facility for evaluation of a transient altered level of consciousness. Patient has been following with endocrinology for adrenal insufficiency, and with neurology for Parkinson's disease, and his course has been complicated by apparent autonomic dysfunction and labile blood pressures. He has had multiple episodes involving transient loss of consciousness and today at the nursing facility, had an episode where he was not responding to staff, but with eyes apparently open and gazing straight ahead. There was no convulsive activity noted and after several seconds, the  patient was able to squeeze a nurse's hand on command. He returned to his usual state within seconds, but reported feeling very fatigued. He denies any chest pain or palpitations and denies headache, change in vision or hearing, or focal numbness or weakness. There is been no recent fall or head trauma and no recent fevers or chills. Patient had a BNP drawn 2 weeks ago at the nursing home, it returned elevated, and he had Lasix increased. His corticosteroid was recently increased by the endocrinologist and, per the visit note, addition of midodrine was being considered.    Hospital Course by Problem:   Transient ALOC - no further episodes in hospital - Pt presents following a transient episode at his nursing home in which he was not responding, but with eyes open and "staring off;" he returned to usual state within seconds to a minute and had no complaints other than fatigue - does not appear to be seizures -EEG shows slowing but no seizure acitivity - Per his family at bedside, he has had multiple such episodes - Head CT is negative for acute pathology and patient is in his usual state on admission  - BP is very labile and there is significant orthostasis; suspect transient hypotension as the cause of his episodes  Orthostatic hypotension, labile BP - This has been an ongoing problem, likely related to PD and autonomic dysfunction, with adrenal insufficiency possibly contributing  - Per recent endocrinology notes, steroid was increased and midodrine was being considered  - echo: Left ventricle: The cavity size was normal. Wall thickness was increased in a pattern of moderate LVH. Systolic function was mildly to moderately reduced. The estimated ejection fraction was in the range  of 40% to 45%. Diffuse hypokinesis. Doppler parameters are consistent with abnormal left ventricular relaxation (grade 1 diastolic dysfunction). -appreciate cardiology consult -midodrine added after  discussion with Dr. Dwyane Dee on phone  Parkinson disease  - Appears to be stable, concerned for autonomic dysfunction as above, continue Sinemet  -outpatient neuro follow up for medication changes  Adrenal insufficiency   -appreciate Dr. Dwyane Dee-- stop florinef-- add midodrine 5 mg 2x/day -continue cortef  Acute kidney injury  - SCr was 1.31 on admission, up from apparent baseline of 1.0  - Likely a prerenal azotemia given his clinical dehydration and transient hypotension  -d/c lasix- no sign of volume overload  Elevated troponin - Troponin slightly elevated on admission to 0.06  - There is no anginal complaint on admission and this likely reflects demand ischemia in setting of transient episodes of hypotension  -troponins flat  GERD - Stable, no EGD report on file  - Managed with Pepcid at home, will continue   Normocytic anemia - Hgb is 11.3 on admission and stable relative to recent priors  - No sign of bleeding, likely secondary to chronic disease  - Continue B12 supplementation   Esophageal dysmotility -suspect related to parkinson's disease  Decreased EF -high risk stress test -defer to cardiology-- medical management, avoid cath for now     Medical Consultants:    cards   Discharge Exam:   Vitals:   10/12/16 1207 10/12/16 1228  BP: (!) 102/56 (!) 143/65  Pulse: 92 87  Resp: 16 16  Temp:     Vitals:   10/12/16 0746 10/12/16 1200 10/12/16 1207 10/12/16 1228  BP: (!) 181/99 (!) 62/37 (!) 102/56 (!) 143/65  Pulse: 72 60 92 87  Resp: 19 16 16 16   Temp:      TempSrc:      SpO2: 100% 100% 100% 100%  Weight:      Height:        Gen:  NAD- still with periods of orthostatic if gets up too quickly   The results of significant diagnostics from this hospitalization (including imaging, microbiology, ancillary and laboratory) are listed below for reference.     Procedures and Diagnostic Studies:   Ct Head Wo Contrast  Result Date:  10/07/2016 CLINICAL DATA:  Aphasia EXAM: CT HEAD WITHOUT CONTRAST TECHNIQUE: Contiguous axial images were obtained from the base of the skull through the vertex without intravenous contrast. COMPARISON:  None. FINDINGS: Brain: There is no evidence for acute hemorrhage, hydrocephalus, mass lesion, or abnormal extra-axial fluid collection. No definite CT evidence for acute infarction. Diffuse loss of parenchymal volume is consistent with atrophy. Patchy low attenuation in the deep hemispheric and periventricular white matter is nonspecific, but likely reflects chronic microvascular ischemic demyelination. Vascular: No hyperdense vessel or unexpected calcification. Skull: No evidence for fracture. No worrisome lytic or sclerotic lesion. Sinuses/Orbits: The visualized paranasal sinuses and mastoid air cells are clear. Visualized portions of the globes and intraorbital fat are unremarkable. Other: None. IMPRESSION: 1. No acute intracranial abnormality. 2. Atrophy with chronic small vessel white matter ischemic disease. Electronically Signed   By: Misty Stanley M.D.   On: 10/07/2016 16:57   Ct Chest Wo Contrast  Result Date: 10/08/2016 CLINICAL DATA:  Shortness of breath. Lung nodule. Abnormal chest x-ray. EXAM: CT CHEST WITHOUT CONTRAST TECHNIQUE: Multidetector CT imaging of the chest was performed following the standard protocol without IV contrast. COMPARISON:  None. FINDINGS: Cardiovascular: The heart size is normal. Blood pool is hypodense, compatible with known anemia. Coronary  artery calcifications are present. The aorta and pulmonary arteries are within normal limits. Mediastinum/Nodes: No significant mediastinal or axillary adenopathy is present. The thoracic inlet is normal. The esophagus is dilated throughout its course in the chest. Lungs/Pleura: Centrilobular emphysema is present. There is no focal lung nodule to correspond to the finding on the chest x-ray. Finding corresponds with overlap of a posterior  right rib and the scapula. Linear scarring or atelectasis is present at the left base. No significant pleural effusion or pneumothorax is present. Upper Abdomen: Cholecystectomy is noted. Limited imaging the upper abdomen demonstrates moderate distention of the stomach. No other focal lesions are present. Musculoskeletal: Fusion of anterior osteophytes throughout the thoracic spine is compatible with DISH. Vertebral body heights are maintained. No focal lytic or blastic lesions are evident. IMPRESSION: 1. No pulmonary nodule.  The finding was likely artifactual. 2. Mild emphysema. 3. Mild atherosclerotic cysts including coronary artery disease. 4. Dilation of the esophagus throughout the mediastinum suggesting esophageal dysmotility. No mass lesion is present. 5. Fusion of anterior osteophytes compatible with DISH. Electronically Signed   By: San Morelle M.D.   On: 10/08/2016 20:35   Dg Chest Port 1 View  Result Date: 10/07/2016 CLINICAL DATA:  Acute onset of aphasia. Loss of consciousness. Chronic lethargy. Initial encounter. EXAM: PORTABLE CHEST 1 VIEW COMPARISON:  Chest radiograph performed 04/18/2016 FINDINGS: The lungs are well-aerated. There is an 8 mm nodular density at the right midlung zone. This may be within the overlying scapula or rib, though no definite similar density was identified on the prior study. There is no evidence of pleural effusion or pneumothorax. The cardiomediastinal silhouette is within normal limits. No acute osseous abnormalities are seen. IMPRESSION: 1. No acute cardiopulmonary process seen. 2. Question of 8 mm nodule at the right midlung zone. CT of the chest would be helpful for further evaluation, when and as deemed clinically appropriate. Electronically Signed   By: Garald Balding M.D.   On: 10/07/2016 17:14     Labs:   Basic Metabolic Panel:  Recent Labs Lab 10/07/16 1454 10/08/16 0312 10/08/16 1200 10/09/16 0536 10/10/16 0519 10/12/16 0441  NA 138  142  --  142 141 134*  K 3.9 3.2*  --  3.4* 3.8 3.3*  CL 105 106  --  110 109 103  CO2 22 24  --  24 25 25   GLUCOSE 121* 167*  --  120* 111* 105*  BUN 34* 26*  --  31* 29* 22*  CREATININE 1.31* 1.38*  --  1.04 1.07 0.96  CALCIUM 8.3* 8.2*  --  8.5* 8.3* 8.1*  MG  --   --  1.9  --   --   --    GFR Estimated Creatinine Clearance: 46.3 mL/min (by C-G formula based on SCr of 0.96 mg/dL). Liver Function Tests:  Recent Labs Lab 10/07/16 1454  AST 30  ALT 23  ALKPHOS 37*  BILITOT 1.2  PROT 5.1*  ALBUMIN 2.9*   No results for input(s): LIPASE, AMYLASE in the last 168 hours. No results for input(s): AMMONIA in the last 168 hours. Coagulation profile  Recent Labs Lab 10/07/16 1454  INR 1.13    CBC:  Recent Labs Lab 10/07/16 1454 10/09/16 0536 10/10/16 0519 10/12/16 0441  WBC 12.5* 13.7* 11.3* 14.1*  HGB 11.3* 10.6* 10.0* 11.3*  HCT 34.3* 32.4* 30.9* 34.4*  MCV 97.4 98.5 98.7 97.5  PLT 163 167 156 170   Cardiac Enzymes:  Recent Labs Lab 10/07/16 1717 10/07/16 2100  10/08/16 0312 10/08/16 0952  TROPONINI 0.06* 0.08* 0.07* 0.07*   BNP: Invalid input(s): POCBNP CBG:  Recent Labs Lab 10/08/16 0557 10/09/16 0628 10/10/16 0618 10/11/16 0622 10/12/16 0645  GLUCAP 167* 111* 117* 95 103*   D-Dimer No results for input(s): DDIMER in the last 72 hours. Hgb A1c No results for input(s): HGBA1C in the last 72 hours. Lipid Profile No results for input(s): CHOL, HDL, LDLCALC, TRIG, CHOLHDL, LDLDIRECT in the last 72 hours. Thyroid function studies No results for input(s): TSH, T4TOTAL, T3FREE, THYROIDAB in the last 72 hours.  Invalid input(s): FREET3 Anemia work up No results for input(s): VITAMINB12, FOLATE, FERRITIN, TIBC, IRON, RETICCTPCT in the last 72 hours. Microbiology Recent Results (from the past 240 hour(s))  MRSA PCR Screening     Status: None   Collection Time: 10/08/16  6:01 AM  Result Value Ref Range Status   MRSA by PCR NEGATIVE NEGATIVE Final     Comment:        The GeneXpert MRSA Assay (FDA approved for NASAL specimens only), is one component of a comprehensive MRSA colonization surveillance program. It is not intended to diagnose MRSA infection nor to guide or monitor treatment for MRSA infections.      Discharge Instructions:   Discharge Instructions    Diet - low sodium heart healthy    Complete by:  As directed    Discharge instructions    Complete by:  As directed    Cbc, BMP 1 week  Do not worry about supine blood pressures Midodrine to be given at 8 am and again at 1PM Fall precautions DNR palliative to follow at SNF   Increase activity slowly    Complete by:  As directed      Allergies as of 10/12/2016      Reactions   Lyrica [pregabalin]    *Anticonvulsants*, fatique      Medication List    STOP taking these medications   fludrocortisone 0.1 MG tablet Commonly known as:  FLORINEF   furosemide 40 MG tablet Commonly known as:  LASIX     TAKE these medications   acetaminophen 325 MG tablet Commonly known as:  TYLENOL Take 325 mg by mouth. Take 2 tablets daily; take 2 every 4 hours as needed for pain   BOOST PLUS PO Take 1 Can by mouth 3 (three) times daily. One can three times a day with meals   carbidopa-levodopa 50-200 MG tablet Commonly known as:  SINEMET CR Take 1 tablet by mouth 3 (three) times daily.   cyanocobalamin 1000 MCG tablet Take 1,000 mcg by mouth daily.   cyclobenzaprine 5 MG tablet Commonly known as:  FLEXERIL Take 1 tablet (5 mg total) by mouth 3 (three) times daily as needed for muscle spasms (muscle pain and stiffness).   DULoxetine 30 MG capsule Commonly known as:  CYMBALTA Take 30 mg by mouth daily.   famotidine 20 MG tablet Commonly known as:  PEPCID Take 20 mg by mouth daily.   feeding supplement (PRO-STAT SUGAR FREE 64) Liqd Take 30 mLs by mouth 3 (three) times daily with meals.   hydrocortisone 10 MG tablet Commonly known as:  CORTEF Take 1  tablet (10 mg total) by mouth at bedtime. What changed:  You were already taking a medication with the same name, and this prescription was added. Make sure you understand how and when to take each.   hydrocortisone 20 MG tablet Commonly known as:  CORTEF Take 1 tablet (20 mg total) by  mouth daily. Start taking on:  10/13/2016 What changed:  medication strength  how much to take  when to take this   ibuprofen 400 MG tablet Commonly known as:  ADVIL,MOTRIN Take 1 tablet (400 mg total) by mouth every 6 (six) hours as needed for moderate pain.   lactulose 10 GM/15ML solution Commonly known as:  CHRONULAC Take 40 g by mouth daily as needed for mild constipation. Take 60 ml as needed for constipation   magnesium oxide 400 (241.3 Mg) MG tablet Commonly known as:  MAG-OX Take 0.5 tablets (200 mg total) by mouth daily. Start taking on:  10/13/2016   megestrol 400 MG/10ML suspension Commonly known as:  MEGACE Take 200 mg by mouth daily.   midodrine 5 MG tablet Commonly known as:  PROAMATINE Take 1 tablet (5 mg total) by mouth 2 (two) times daily with a meal. Take when you get up (8AMish) and again at 1 PM   polyethylene glycol packet Commonly known as:  MIRALAX / GLYCOLAX Take 17 g by mouth daily as needed for mild constipation.   potassium chloride SA 20 MEQ tablet Commonly known as:  K-DUR,KLOR-CON Take 1 tablet (20 mEq total) by mouth daily. What changed:  how much to take   psyllium 95 % Pack Commonly known as:  HYDROCIL/METAMUCIL Take 1 packet by mouth daily.   THERA-M Tabs TAKE ONE TABLET BY MOUTH EVERY DAY (FORMULARY SUB. FOR DAILY VITE)   zinc oxide 20 % ointment Apply 1 application topically 2 (two) times daily as needed for irritation.      Follow-up Information    Jeanmarie Hubert, MD.   Specialty:  Internal Medicine Contact information: [place] [address] [phone]        Adrian Prows, MD In 2 weeks.   Specialty:   Cardiology Contact information: [address] [place] [address] [phone]            Time coordinating discharge: 35 min  Signed:  Chasty Randal Alison Stalling   [facility] 10/12/2016, 1:06 PM

## 2016-10-12 NOTE — Progress Notes (Addendum)
Daily Progress Note   Patient Name: Lawrence Turner       Date: 10/12/2016 DOB: 1925/11/06  Age: 83 y.o. MRN#: [id number] Attending Physician: Geradine Girt, DO Primary Care Physician: Jeanmarie Hubert, MD Admit Date: 10/07/2016  Reason for Consultation/Follow-up: Non pain symptom management and Psychosocial/spiritual support  Subjective: Lawrence Turner was not feeling well this morning. He was frustrated by the recurrent orthostatic blood pressure last night, with the resultant weakness and feeling light-headed. He is also feeling a bit confused on time, thinking his meal tray was dinner instead of breakfast. He was not eager to engage in conversation, which was different compared to the prior two visits when he was very conversational.  Length of Stay: 4  Current Medications: Scheduled Meds:  . carbidopa-levodopa  1 tablet Oral TID  . docusate sodium  100 mg Oral Daily  . DULoxetine  30 mg Oral Daily  . enoxaparin (LOVENOX) injection  40 mg Subcutaneous Q24H  . famotidine  20 mg Oral Daily  . feeding supplement  1 Container Oral Q24H  . feeding supplement (ENSURE ENLIVE)  237 mL Oral Q24H  . feeding supplement (PRO-STAT SUGAR FREE 64)  30 mL Oral TID WC  . hydrocortisone  10 mg Oral QHS  . hydrocortisone  20 mg Oral Daily  . magnesium oxide  200 mg Oral Daily  . megestrol  200 mg Oral Daily  . midodrine  2.5 mg Oral Q2000  . midodrine  5 mg Oral BID WC  . potassium chloride  40 mEq Oral Once  . psyllium  1 packet Oral Daily  . sodium chloride flush  3 mL Intravenous Q12H  . cyanocobalamin  1,000 mcg Oral Daily    Continuous Infusions:   PRN Meds: acetaminophen, cyclobenzaprine, HYDROcodone-acetaminophen, labetalol, lactulose, MUSCLE RUB, ondansetron **OR** ondansetron (ZOFRAN) IV, polyethylene glycol, zinc oxide  Physical Exam      Constitutional: He is oriented to person, place, and time. He appears cachectic. He has a sickly appearance.  HENT:  Head: Normocephalic and atraumatic.  Mouth/Throat: No oropharyngeal exudate.  Eyes: EOM are normal.  Neck: Normal range of motion.  Cardiovascular: Normal rate.   Pulmonary/Chest: Effort normal. No respiratory distress.  Abdominal: Soft.  Musculoskeletal: Normal range of motion.  Muscle atrophy noted in all extremities. Pill rolling at rest on right hand.  Neurological: He is alert and oriented to person, place, and time.  Skin: Skin is warm and dry. Bruising noted. There is pallor.  Psychiatric: Judgment and thought content normal. His speech is delayed. He is slowed and withdrawn. Cognition and memory are normal.  Flat affect, more withdrawn today.        Vital Signs: BP (!) 181/99 (BP Location: Left Arm)   Pulse 72   Temp 98.3 F (36.8 C) (Oral)   Resp 19   Ht 5\' 10"  (1.778 m)   Wt 62.7 kg (138 lb 4.8 oz)   SpO2 100%   BMI 19.84 kg/m  SpO2: SpO2: 100 % O2 Device: O2 Device: Not Delivered O2 Flow Rate:    Intake/output summary:   Intake/Output Summary (Last 24 hours) at 10/12/16 0851 Last data filed at 10/12/16 0700  Gross per 24 hour  Intake  440 ml  Output             1526 ml  Net            -1086 ml   LBM: Last BM Date: 10/11/16 Baseline Weight: Weight: 62.1 kg (136 lb 14.4 oz) (Pt unable to stand. ) Most recent weight: Weight: 62.7 kg (138 lb 4.8 oz)  Palliative Assessment/Data: PPS 40%   Flowsheet Rows   Flowsheet Row Most Recent Value  Intake Tab  Referral Department  Hospitalist  Unit at Time of Referral  Cardiac/Telemetry Unit  Palliative Care Primary Diagnosis  Neurology  Date Notified  10/10/16  Palliative Care Type  New Palliative care  Reason for referral  Clarify Goals of Care  Date of Admission  10/07/16  Date first seen by Palliative Care  10/10/16  # of days Palliative referral response time  0 Day(s)  # of days  IP prior to Palliative referral  3  Clinical Assessment  Psychosocial & Spiritual Assessment  Palliative Care Outcomes      Patient Active Problem List   Diagnosis Date Noted  . Goals of care, counseling/discussion   . Palliative care by specialist   . Malnutrition of moderate degree 10/08/2016  . Transient alteration of awareness 10/08/2016  . Elevated troponin   . Hypertensive urgency 10/07/2016  . Altered awareness, transient 10/07/2016  . Hypokalemia 10/02/2016  . B12 deficiency 09/24/2016  . Malaise and fatigue 08/21/2016  . CHF (congestive heart failure) ([place]) 05/22/2016  . Pressure ulcer, stage 2 04/21/2016  . Secondary adrenal insufficiency ([place]) 04/21/2016  . Syncope 04/19/2016  . Orthostatic hypotension 04/18/2016  . Seizures ([place]) 04/17/2016  . Ulcer of foot ([place]) 04/03/2016  . Weight loss 01/24/2016  . Hammer toe of left foot 10/25/2015  . Abnormality of gait 10/19/2015  . Left hip pain 08/09/2015  . Edema 03/15/2015  . Constipation 10/05/2014  . Corn 10/05/2014  . Parkinson's disease ([place]) 02/02/2014  . Uncontrolled hypertension   . Esophageal reflux   . Peripheral neuropathy ([place])   . BPH (benign prostatic hyperplasia)   . Other B-complex deficiencies   . Normocytic anemia   . Unspecified vitamin D deficiency   . Pure hypercholesterolemia   . Benign neoplasm of colon     Palliative Care Assessment & Plan   HPI: 83 y.o. male with past medical history of Parkinson's disease with associated autonomic dysfunction and labile blood pressures, adrenal insufficiency, questionable seizure history, GERD, and chronic constipation who presented to the ED after a brief period of transient loss of consciousness. He was subsequently admitted on 10/07/2016 for evaluation. Work-up so far has indicated transient hypotension as likely cause of episode. EEG showed slowing but no seizure activity. Heat CT negative for acute pathology. BP very labile likely secondary to PD and  autonomic dysfunction, with adrenal insufficiency likely contributory. Midodrine initiated with florinef stopped with resultant hypertension when supine. Cardiology also consulted for new dCHF; plan for ischemic evaluation and monitoring on midodrine.  Assessment: Please see my initial consult note on 2/21 for full details of my conversation with Ozzie Hoyle (the patient's son). In brief, Ozzie Hoyle was primarily concerned with his father's anxiety and possible depression. He was open to recommendations for management and ongoing support.  In reviewing Mr. Schappell's medication and medication history there have been three agents utilized, Remeron (15mg ), Lexapro 10mg , Cymbalta 20mg , and now Cymbalta 30mg . All of these medications were seemingly initiated for a combination of pain/neuropathy, sleep, and mood. That said, there  has been no clear titration of the medications and Ozzie Hoyle has felt they were ineffective. Currently, Mr. Tibbetts is undergoing cardiac work-up, his medications for PD and adrenal insufficiency are being adjusted, and he continues to struggle with supine hypertension and orthostatic hypotension. In considering these things I recommended limiting the pharmacologic addition/change of medication, and instead pursuing CBT as a modality to help manage his anxiety. I discussed these things with Ozzie Hoyle, who was open to this option. I then discussed it with Mr. Orndorff, who was also open to meeting with a therapist to work through some of his anxiety. I called [facility] and spoke with the social worker, Ellin Mayhew (?spelling). She is going to investigate therapists in the area and work to coordinate a referral and transportation for after discharge.   In terms of longitudinal support, I also would like Mr. Oen to have ongoing good Palliative Care. He is eligible for [facility] based on his insurance, however [facility] does not work with patients in facilities (which [facility] counts  as, per their nurse coordinator). Consequently, I would recommend Palliative Care through [facility], which is serviced through [facility].  Today, I called Cliff to update him on the above information. He had done his own research about local therapists and specifically wanted a referral for [facility]. I called Betsy Pries and left her a message, asking her to coordinate an appointment. I also explained that [facility] does not currently work with patients in facilities, so we would have to pursue palliative support through [facility] with [facility].   Recommendations/Plan:  DNR, otherwise treat what is treatable  For outpatient CBT, called [facility], per son request. Pt is scheduled for an appointment on March 19th at 10:30am. A reminder was placed in discharge instructions, and Ozzie Hoyle was updated on appt date over the phone  Would recommend Palliative on discharge, through [facility] (include order on discharge paperwork)  **Goals of care and support post discharge are well established for Mr. Schlosser at this point. Palliative will now follow along peripherally. Please contact us at [phone] for specific questions or concerns.  Code Status:  DNR  Prognosis:   Unable to determine  Discharge Planning:  [place] for rehab with Palliative care service follow-up  Care plan was discussed with pt and pt's son.  Thank you for allowing the Palliative Medicine Team to assist in the care of this patient.   Total time: 35 minutes.    Greater than 50%  of this time was spent counseling and coordinating care related to the above assessment and plan.  Charlynn Court, NP Palliative Medicine Team [phone] pager (7a-5p) Team Phone # [phone]

## 2016-10-12 NOTE — Progress Notes (Signed)
Paged MD regarding pt blood pressure, awaiting call back  Kierrah Kilbride Leory Plowman

## 2016-10-12 NOTE — Progress Notes (Signed)
Subjective:  Patient for nuclear stress yesterday. Was constipated. Physical therapy not started yet. Events from last evening noted.   Objective:  Vital Signs in the last 24 hours: Temp:  [98 F (36.7 C)-98.3 F (36.8 C)] 98.3 F (36.8 C) (02/23 0435) Pulse Rate:  [72-95] 72 (02/23 0746) Resp:  [16-19] 19 (02/23 0746) BP: (79-234)/(59-102) 181/99 (02/23 0746) SpO2:  [99 %-100 %] 100 % (02/23 0746) Weight:  [138 lb 4.8 oz (62.7 kg)] 138 lb 4.8 oz (62.7 kg) (02/23 0435)  Intake/Output from previous day: 02/22 0701 - 02/23 0700 In: 440 [P.O.:440] Out: 1526 [Urine:1525; Stool:1]  Physical Exam: General appearance: alert, cooperative, appears stated age and no distress Lungs: clear to auscultation bilaterally Heart: regular rate and rhythm, S1, S2 normal, no murmur, click, rub or gallop Abdomen: soft, non-tender; bowel sounds normal; no masses,  no organomegaly Extremities: extremities normal, atraumatic, no cyanosis or edema Pulses: 2+ and symmetric Neurologic: Grossly normal  Lab Results: BMP  Recent Labs  10/09/16 0536 10/10/16 0519 10/12/16 0441  NA 142 141 134*  K 3.4* 3.8 3.3*  CL 110 109 103  CO2 24 25 25   GLUCOSE 120* 111* 105*  BUN 31* 29* 22*  CREATININE 1.04 1.07 0.96  CALCIUM 8.5* 8.3* 8.1*  GFRNONAA >60 59* >60  GFRAA >60 >60 >60    CBC  Recent Labs Lab 10/12/16 0441  WBC 14.1*  RBC 3.53*  HGB 11.3*  HCT 34.4*  PLT 170  MCV 97.5  MCH 32.0  MCHC 32.8  RDW 15.5    Recent Labs  10/07/16 2100 10/08/16 0312 10/08/16 0952  TROPONINI 0.08* 0.07* 0.07*    Recent Labs  04/19/16 0434 04/24/16 1517 10/07/16 2000  TSH 2.286 2.297 0.918    Recent Labs  05/08/16 1653  05/24/16 1500 08/23/16 10/07/16 1454  PROT 5.2*  --  5.6*  --  5.1*  ALBUMIN 3.2*  --  3.0*  --  2.9*  AST 27  < > 29 14 30   ALT 30  < > 10 6* 23  ALKPHOS 39  < > 39 26 37*  BILITOT 0.3  --  0.5  --  1.2  < > = values in this interval not  displayed.  Imaging: Imaging results have been reviewed  Cardiac Studies:  EKG 10/07/2016: Normal sinus rhythm at rate of 89 bpm, normal axis, voltage criteria for LVH.  Nonspecific T wave inversion in high lateral leads, suggests LVH with repolarization.  Normal QT interval.  Echocardiogram 10/09/2016: Normal LV size, mild to moderate global hypokinesis, EF 52-77%, grade 1 diastolic dysfunction.  Trace aortic regurgitation, mild left atrial enlargement, mild tricuspid regurgitation, PA pressure 33 mmHg.  Mild increase in CVP.  Compared to echocardiogram done on 04/20/2016, low LVEF new, previously 55-60%  Assessment/Plan:  1.  Orthostatic hypotension and supine hypertension related to secondary autonomic insufficiency from Parkinson's disease, also has chronic adrenal insufficiency and on chronic steroid therapy. 2.  Syncope 10/07/16 and near syncope yesterday when nursing tried to stand him due to severe orthostasis.  3. Acute on chronic diastolic  heart failure to be the etiology for dyspnea. 4.  Coronary calcification by CT scan of the chest dated 10/08/2016.  Also noted during CT scan was esophageal dysmotility with esophageal dilatation throughout the mediastinum.  5.  Abnormal nuclear stress test revealing anterior ischemia with severely reduced LVEF, high risk. 6.  Anemia and severe hypoproteinemia, total protein 5.1, albumin 2.9. 7.  Chronic renal insufficiency, stage III  eGFR  47 mL. 8. Constipation persists, received enema yesterday, still feels constipated.  Rec: I had a 45 minute face-to-face encounter with the patient and his son over the telephone, discussed in detail regarding the abnormal stress test and abnormal echocardiogram that is changed since December 2018, symptoms of more fatigue and dyspnea.  However at this point he may have significant coronary artery disease but I'm not sure that revascularization will improve his quality of 5, especially I'm not sure that this will  improve his orthostatic hypotension.  Also it predisposes him for high risk for contrast nephropathy, procedural complication due to his age.  I have recommended conservative therapy for CAD.  They're in agreement.  At this point, it is essential to improve the quality of life and would recommend using midodrine in the morning and in the afternoon and to avoid it in the evening.  Patient received midodrine last night around 9 or 10 PM hence was hypertensive throughout the night.  I am not sure that we will ever be able to make him stand up and walk with a walker, however if he can improve this to make him use a wheelchair, would recommend giving midodrine as soon as he wakes up in the morning and again prior to his lunch and hold off in the evening and advised that he probably should have his dinner in the bed or on a wheelchair.  Also not to worry about supine hypertension.  Also discussed with Dr. Eulogio Bear.  Nothing much to offer from cardiac standpoint at this admission, I'll be happy to follow him up in the outpatient basis. Avoid diuretics and also antihypertensives.  Adrian Prows, M.D. 10/12/2016, 10:00 AM [facility] Pager: [phone] Office: [phone] If no answer: [phone]

## 2016-10-12 NOTE — Progress Notes (Signed)
Transport at bedside. Pt has all belongings including cell phone and clothing. Transporter has all discharge paperwork, prescriptions and DNR form. Transporter informed that pt has orthostatic blood pressures when standing.   Lawrence Turner

## 2016-10-12 NOTE — Progress Notes (Signed)
Paged social work regarding pt discharge. Awaiting call back. Talked to patient son again, informed of pt discharge. Son asked if pt had a BM today, states pt has chronic constipation. Informed son he had a BM today, and pt was given senakot and colace    Lawrence Turner

## 2016-10-15 ENCOUNTER — Non-Acute Institutional Stay (SKILLED_NURSING_FACILITY): Payer: Medicare Other | Admitting: Internal Medicine

## 2016-10-15 ENCOUNTER — Encounter: Payer: Self-pay | Admitting: Internal Medicine

## 2016-10-15 DIAGNOSIS — G20 Parkinson's disease: Secondary | ICD-10-CM

## 2016-10-15 DIAGNOSIS — R0600 Dyspnea, unspecified: Secondary | ICD-10-CM

## 2016-10-15 DIAGNOSIS — I951 Orthostatic hypotension: Secondary | ICD-10-CM | POA: Diagnosis not present

## 2016-10-15 DIAGNOSIS — D649 Anemia, unspecified: Secondary | ICD-10-CM

## 2016-10-15 DIAGNOSIS — R404 Transient alteration of awareness: Secondary | ICD-10-CM

## 2016-10-15 DIAGNOSIS — I509 Heart failure, unspecified: Secondary | ICD-10-CM

## 2016-10-15 NOTE — Progress Notes (Signed)
History and Physical      Location:  Circle Room Number: N36 Place of Service:  SNF (31)  PCP: Jeanmarie Hubert, MD Patient Care Team: Estill Dooms, MD as PCP - General (Internal Medicine) Man Otho Darner, NP as Nurse Practitioner (Nurse Practitioner) Kathrynn Ducking, MD as Consulting Physician (Neurology)  Extended Emergency Contact Information Primary Emergency Contact: Lucita Ferrara Address: [address]           [place], [place] [address] [name] of [place] Phone: [phone] Relation: Spouse Secondary Emergency Contact: Stanwood,Clifford  [place] Phone: [phone] Relation: Son  Code Status: DNR Goals of Care: Advanced Directive information Advanced Directives 10/15/2016  Does Patient Have a Medical Advance Directive? Yes  Type of Paramedic of [place];Living will;Out of facility DNR (pink MOST or yellow form)  Does patient want to make changes to medical advance directive? -  Copy of [place] in Chart? Yes  Pre-existing out of facility DNR order (yellow form or pink MOST form) Yellow form placed in chart (order not valid for inpatient use)      Chief Complaint  Patient presents with  . New Admit To SNF    following hospitalization 10/07/16 to 10/12/16 for transient ALOC    HPI: Patient is a 83 y.o. male seen today for admission to [facility] SNF on 10/12/16 following hospitalization from 10/07/16 to 10/12/16. He was admitted with a history of transient altered level of consciousness. He was staring straight ahead and non responsive for several seconds. He has a history of other similar events and possible seizure disorder. There was no evidence of grand mal seizure with this last episode. He reported great fatigue.EEg in hospital showed slowing, but no seizure activity. Head CT showed atrophy and small vessel ischemic disease..  Patient had a BNP elevated at [facility] in Jan  2018. He has chronically swollen ankles. 2D echo showed LVH 45% with diffuse hypokinesis. Has has had complaints of dyspnea and easy fatigue over the last couple of months.  CT of the chest showed mild emphysema.  He has known adrenal insufficiency and is followed by Dr. Dwyane Dee, endocrine. During the hospital stay, he had Florinef stopped and midodrine started.  Mild anemia with Hgb 11.3 is noted.  There is mild CKD. With hydration, his BUN went from 34 to 22, and creatinine went from 1.38 to 0.96  Past Medical History:  Diagnosis Date  . Abnormality of gait 10/19/2015  . Anemia, unspecified   . Benign neoplasm of colon    pre-cancerous polyps removed at colonoscopy 2008  . Contact dermatitis and other eczema, due to unspecified cause   . Diverticulosis of colon (without mention of hemorrhage)   . Esophageal reflux   . Hypertrophy of prostate without urinary obstruction and other lower urinary tract symptoms (LUTS)   . Mononeuritis of unspecified site   . Orthostatic hypotension 04/18/2016  . Osteoarthrosis, unspecified whether generalized or localized, pelvic region and thigh   . Other B-complex deficiencies   . Other pulmonary embolism and infarction    following cholecystectomy 2003  . Parkinson disease ([place]) 10/19/2015  . Peripheral neuropathy ([place])   . Pure hypercholesterolemia   . Rosacea   . Seizures ([place]) 04/17/2016  . 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 Syncope and collapse 04/18/2016  . Ulcer of foot ([place]) 04/03/2016  . Unspecified constipation   . Unspecified essential hypertension   .  Unspecified gastritis and gastroduodenitis without mention of hemorrhage 08/29/2006  . Unspecified hemorrhoids without mention of complication   . Unspecified vitamin D deficiency    Past Surgical History:  Procedure Laterality Date  . CATARACT EXTRACTION W/ INTRAOCULAR LENS IMPLANT Left 5/20//2013  . CHOLECYSTECTOMY, LAPAROSCOPIC  2003  . colonic polyp removal   1965  . COLONOSCOPY  08/29/2006   hemorrhoids, 3 small polyps (hyperplastic) & diverticulosis  . [place]    reports that he quit smoking about 53 years ago. His smoking use included Cigarettes. He quit after 15.00 years of use. He has never used smokeless tobacco. He reports that he drinks about 1.2 oz of alcohol per week . He reports that he does not use drugs. Social History   Social History  . Marital status: Married    Spouse name: [name]  . Number of children: 1  . Years of education: 8   Occupational History  . retired Advertising account executive    Social History Main Topics  . Smoking status: Former Smoker    Years: 15.00    Types: Cigarettes    Quit date: 01/30/1963  . Smokeless tobacco: Never Used  . Alcohol use 1.2 oz/week    1 Glasses of wine, 1 Cans of beer per week     Comment: 14 per week  . Drug use: No  . Sexual activity: Not on file   Other Topics Concern  . Not on file   Social History Narrative   Lives at [facility] since 01/20/2014, 10/19/15 currently in assisted living area   Married [place]   Former smoker stopped 1964   Alcohlol 14 drinks per week   Exercise walks daily   Living Will, POA             Family History  Problem Relation Age of Onset  . Cancer Mother     breast  . Pneumonia Father   . Cancer Brother     Health Maintenance  Topic Date Due  . Samul Dada  09/26/1944  . PNA vac Low Risk Adult (2 of 2 - PCV13) 06/12/2003  . INFLUENZA VACCINE  Completed    Allergies  Allergen Reactions  . Lyrica [Pregabalin]     *Anticonvulsants*, fatique    Allergies as of 10/15/2016      Reactions   Lyrica [pregabalin]    *Anticonvulsants*, fatique      Medication List       Accurate as of 10/15/16 11:46 AM. Always use your most recent med list.          acetaminophen 325 MG tablet Commonly known as:  TYLENOL Take 325 mg by mouth. Take 2 tablets daily; take 2 every 4 hours as needed for pain   BOOST PLUS PO Take 1 Can by  mouth. One can two times a day with meals   carbidopa-levodopa 50-200 MG tablet Commonly known as:  SINEMET CR Take 1 tablet by mouth 3 (three) times daily.   cyclobenzaprine 5 MG tablet Commonly known as:  FLEXERIL Take 1 tablet (5 mg total) by mouth 3 (three) times daily as needed for muscle spasms (muscle pain and stiffness).   DULoxetine 30 MG capsule Commonly known as:  CYMBALTA Take 30 mg by mouth daily.   famotidine 20 MG tablet Commonly known as:  PEPCID Take 20 mg by mouth daily.   feeding supplement (PRO-STAT SUGAR FREE 64) Liqd Take 30 mLs by mouth 3 (three) times daily with meals.   hydrocortisone 10  MG tablet Commonly known as:  CORTEF Take 1 tablet (10 mg total) by mouth at bedtime.   ibuprofen 400 MG tablet Commonly known as:  ADVIL,MOTRIN Take 1 tablet (400 mg total) by mouth every 6 (six) hours as needed for moderate pain.   lactulose 10 GM/15ML solution Commonly known as:  CHRONULAC Take 40 g by mouth daily as needed for mild constipation. Take 60 ml as needed for constipation   magnesium oxide 400 (241.3 Mg) MG tablet Commonly known as:  MAG-OX Take 0.5 tablets (200 mg total) by mouth daily.   megestrol 400 MG/10ML suspension Commonly known as:  MEGACE Take 200 mg by mouth daily.   midodrine 5 MG tablet Commonly known as:  PROAMATINE Take 1 tablet (5 mg total) by mouth 2 (two) times daily with a meal. Take when you get up (8AMish) and again at 1 PM   polyethylene glycol packet Commonly known as:  MIRALAX / GLYCOLAX Take 17 g by mouth daily as needed for mild constipation.   potassium chloride SA 20 MEQ tablet Commonly known as:  K-DUR,KLOR-CON Take 20 mEq by mouth. Take one tablet daily for potassium   psyllium 95 % Pack Commonly known as:  HYDROCIL/METAMUCIL Take 1 packet by mouth daily.   THERA-M Tabs TAKE ONE TABLET BY MOUTH EVERY DAY (FORMULARY SUB. FOR DAILY VITE)   Vitamin B12-Folic Acid XX123456 MCG Tabs Take by mouth. Take two  tablets once a day   zinc oxide 20 % ointment Apply 1 application topically 2 (two) times daily as needed for irritation.       Review of Systems  Constitutional: Positive for activity change and fatigue. Negative for chills, diaphoresis and fever.  HENT: Negative for congestion, ear pain and hearing loss.   Eyes: Negative for photophobia, pain, discharge and redness.  Respiratory: Positive for shortness of breath. Negative for cough and wheezing.   Cardiovascular: Positive for leg swelling (both lower legs ). Negative for chest pain and palpitations.       Postural drops in BP. Very high BP at times. Hx CHF  Gastrointestinal: Positive for constipation. Negative for abdominal pain, blood in stool, diarrhea and nausea.  Endocrine:       B12 deficiency on oral supplements  Genitourinary: Negative.   Musculoskeletal: Positive for arthralgias. Negative for back pain, myalgias and neck pain.  Skin:       Multiple seborrheic keratoses.   Neurological: Positive for tremors and weakness. Negative for seizures and headaches.       Complaint of loss of motor skills and changes in. He is unstable with walking. He has a known peripheral neuropathy of uncertain origin. Micrographia. Pressured, soft speech that is nearly inaudible. History of parkinsonism currently treated with Sinemet.  Hematological:       History of anemia  Psychiatric/Behavioral: Positive for confusion and decreased concentration. The patient is nervous/anxious.     Vitals:   10/15/16 1122  BP: 130/60  Pulse: 78  Resp: 16  Temp: 98.1 F (36.7 C)  SpO2: 98%  Weight: 144 lb (65.3 kg)  Height: 5\' 10"  (1.778 m)   Body mass index is 20.66 kg/m. Physical Exam  Constitutional: He is oriented to person, place, and time.  Thin. Elderly. Chronically weaka nd tired.  HENT:  Right Ear: External ear normal.  Left Ear: External ear normal.  Nose: Nose normal.  Mouth/Throat: Oropharynx is clear and moist. No oropharyngeal  exudate.  Eyes: Conjunctivae are normal. Pupils are equal, round, and reactive to  light.  Neck: No JVD present. No tracheal deviation present. No thyromegaly present.  Cardiovascular: Normal rate, regular rhythm, normal heart sounds and intact distal pulses.  Exam reveals no gallop and no friction rub.   No murmur heard. Pulmonary/Chest: No respiratory distress. He has no wheezes. He has no rales. He exhibits no tenderness.  Abdominal: He exhibits no distension and no mass. There is no tenderness.  Musculoskeletal: Normal range of motion. He exhibits edema (1+ ipedal) and tenderness.  unstable gait. Trace edema BLE. Pain with movement of the left hip and thigh  Lymphadenopathy:    He has no cervical adenopathy.  Neurological: He is alert and oriented to person, place, and time. He displays abnormal reflex (Absent at both knees). No cranial nerve deficit. Coordination normal.  Cogwheeling most evident in the right wrist. Gait is mildly abnormal with a suggestion of a festinating gait. Speech is soft and mildly pressured. There is a mild tremor at rest. There is no focal weakness. 1/14//15 MMSE 30/30. Passed clock drawing test.  Skin: No rash noted. No erythema. No pallor.  Healed corn at the plantar aspect of the the left 5th MTJ  Psychiatric:  Patient appears mildly anxious.     Labs reviewed:  Recent Labs  04/19/16 0434  10/08/16 1200 10/09/16 0536 10/10/16 0519 10/12/16 0441  NA 141  < >  --  142 141 134*  K 4.1  < >  --  3.4* 3.8 3.3*  CL 111  < >  --  110 109 103  CO2 22  < >  --  24 25 25   GLUCOSE 96  < >  --  120* 111* 105*  BUN 26*  < >  --  31* 29* 22*  CREATININE 1.16  < >  --  1.04 1.07 0.96  CALCIUM 9.0  < >  --  8.5* 8.3* 8.1*  MG 2.1  --  1.9  --   --   --   < > = values in this interval not displayed. Liver Function Tests:  Recent Labs  05/08/16 1653  05/24/16 1500 08/23/16 10/07/16 1454  AST 27  < > 29 14 30   ALT 30  < > 10 6* 23  ALKPHOS 39  < > 39 26  37*  BILITOT 0.3  --  0.5  --  1.2  PROT 5.2*  --  5.6*  --  5.1*  ALBUMIN 3.2*  --  3.0*  --  2.9*  < > = values in this interval not displayed. No results for input(s): LIPASE, AMYLASE in the last 8760 hours. No results for input(s): AMMONIA in the last 8760 hours. CBC:  Recent Labs  04/22/16 0523 04/23/16 0357 04/24/16 0654  10/09/16 0536 10/10/16 0519 10/12/16 0441  WBC 9.4 9.3 10.0  < > 13.7* 11.3* 14.1*  NEUTROABS 7.5 7.2 8.0*  --   --   --   --   HGB 10.2* 10.4* 10.7*  < > 10.6* 10.0* 11.3*  HCT 31.5* 31.0* 32.6*  < > 32.4* 30.9* 34.4*  MCV 95.2 94.2 94.8  < > 98.5 98.7 97.5  PLT 161 164 149*  < > 167 156 170  < > = values in this interval not displayed. Cardiac Enzymes:  Recent Labs  10/07/16 2100 10/08/16 0312 10/08/16 0952  TROPONINI 0.08* 0.07* 0.07*   BNP: Invalid input(s): POCBNP No results found for: HGBA1C Lab Results  Component Value Date   TSH 0.918 10/07/2016   Lab Results  Component Value Date   [id number] 10/19/2015   No results found for: FOLATE No results found for: IRON, TIBC, FERRITIN  Imaging and Procedures obtained prior to SNF admission: Ct Head Wo Contrast  Result Date: 10/07/2016 CLINICAL DATA:  Aphasia EXAM: CT HEAD WITHOUT CONTRAST TECHNIQUE: Contiguous axial images were obtained from the base of the skull through the vertex without intravenous contrast. COMPARISON:  None. FINDINGS: Brain: There is no evidence for acute hemorrhage, hydrocephalus, mass lesion, or abnormal extra-axial fluid collection. No definite CT evidence for acute infarction. Diffuse loss of parenchymal volume is consistent with atrophy. Patchy low attenuation in the deep hemispheric and periventricular white matter is nonspecific, but likely reflects chronic microvascular ischemic demyelination. Vascular: No hyperdense vessel or unexpected calcification. Skull: No evidence for fracture. No worrisome lytic or sclerotic lesion. Sinuses/Orbits: The visualized  paranasal sinuses and mastoid air cells are clear. Visualized portions of the globes and intraorbital fat are unremarkable. Other: None. IMPRESSION: 1. No acute intracranial abnormality. 2. Atrophy with chronic small vessel white matter ischemic disease. Electronically Signed   By: Misty Stanley M.D.   On: 10/07/2016 16:57   Ct Chest Wo Contrast  Result Date: 10/08/2016 CLINICAL DATA:  Shortness of breath. Lung nodule. Abnormal chest x-ray. EXAM: CT CHEST WITHOUT CONTRAST TECHNIQUE: Multidetector CT imaging of the chest was performed following the standard protocol without IV contrast. COMPARISON:  None. FINDINGS: Cardiovascular: The heart size is normal. Blood pool is hypodense, compatible with known anemia. Coronary artery calcifications are present. The aorta and pulmonary arteries are within normal limits. Mediastinum/Nodes: No significant mediastinal or axillary adenopathy is present. The thoracic inlet is normal. The esophagus is dilated throughout its course in the chest. Lungs/Pleura: Centrilobular emphysema is present. There is no focal lung nodule to correspond to the finding on the chest x-ray. Finding corresponds with overlap of a posterior right rib and the scapula. Linear scarring or atelectasis is present at the left base. No significant pleural effusion or pneumothorax is present. Upper Abdomen: Cholecystectomy is noted. Limited imaging the upper abdomen demonstrates moderate distention of the stomach. No other focal lesions are present. Musculoskeletal: Fusion of anterior osteophytes throughout the thoracic spine is compatible with DISH. Vertebral body heights are maintained. No focal lytic or blastic lesions are evident. IMPRESSION: 1. No pulmonary nodule.  The finding was likely artifactual. 2. Mild emphysema. 3. Mild atherosclerotic cysts including coronary artery disease. 4. Dilation of the esophagus throughout the mediastinum suggesting esophageal dysmotility. No mass lesion is present. 5.  Fusion of anterior osteophytes compatible with DISH. Electronically Signed   By: San Morelle M.D.   On: 10/08/2016 20:35   Dg Chest Port 1 View  Result Date: 10/07/2016 CLINICAL DATA:  Acute onset of aphasia. Loss of consciousness. Chronic lethargy. Initial encounter. EXAM: PORTABLE CHEST 1 VIEW COMPARISON:  Chest radiograph performed 04/18/2016 FINDINGS: The lungs are well-aerated. There is an 8 mm nodular density at the right midlung zone. This may be within the overlying scapula or rib, though no definite similar density was identified on the prior study. There is no evidence of pleural effusion or pneumothorax. The cardiomediastinal silhouette is within normal limits. No acute osseous abnormalities are seen. IMPRESSION: 1. No acute cardiopulmonary process seen. 2. Question of 8 mm nodule at the right midlung zone. CT of the chest would be helpful for further evaluation, when and as deemed clinically appropriate. Electronically Signed   By: Garald Balding M.D.   On: 10/07/2016 17:14    Assessment/Plan  1.  Altered awareness, transient uncertain etiology  2. Normocytic anemia -CBC  3. Orthostatic hypotension Continue midodrine  4. Dyspnea, unspecified type -BNP  5. Parkinson's disease ([place]) The current medical regimen is effective;  continue present plan and medications.  6. Congestive heart failure, unspecified congestive heart failure chronicity, unspecified congestive heart failure type ([place]) Continue current meds -BNP

## 2016-10-22 ENCOUNTER — Emergency Department (HOSPITAL_COMMUNITY)
Admission: EM | Admit: 2016-10-22 | Discharge: 2016-10-22 | Disposition: A | Discharge status: Home / Self Care | Payer: Medicare Other | Attending: Emergency Medicine | Admitting: Emergency Medicine

## 2016-10-22 ENCOUNTER — Emergency Department (HOSPITAL_COMMUNITY): Payer: Medicare Other

## 2016-10-22 ENCOUNTER — Encounter: Payer: Self-pay | Admitting: Internal Medicine

## 2016-10-22 ENCOUNTER — Encounter (HOSPITAL_COMMUNITY): Payer: Self-pay

## 2016-10-22 DIAGNOSIS — I110 Hypertensive heart disease with heart failure: Secondary | ICD-10-CM | POA: Insufficient documentation

## 2016-10-22 DIAGNOSIS — Z87891 Personal history of nicotine dependence: Secondary | ICD-10-CM | POA: Insufficient documentation

## 2016-10-22 DIAGNOSIS — I509 Heart failure, unspecified: Secondary | ICD-10-CM | POA: Insufficient documentation

## 2016-10-22 DIAGNOSIS — R42 Dizziness and giddiness: Secondary | ICD-10-CM | POA: Diagnosis not present

## 2016-10-22 DIAGNOSIS — R55 Syncope and collapse: Secondary | ICD-10-CM | POA: Insufficient documentation

## 2016-10-22 DIAGNOSIS — R0602 Shortness of breath: Secondary | ICD-10-CM | POA: Diagnosis not present

## 2016-10-22 LAB — CBC WITH DIFFERENTIAL/PLATELET
BASOS ABS: 0 10*3/uL (ref 0.0–0.1)
Basophils Relative: 0 %
Eosinophils Absolute: 0.2 10*3/uL (ref 0.0–0.7)
Eosinophils Relative: 2 %
HCT: 32.4 % — ABNORMAL LOW (ref 39.0–52.0)
HEMOGLOBIN: 10.4 g/dL — AB (ref 13.0–17.0)
LYMPHS ABS: 0.8 10*3/uL (ref 0.7–4.0)
LYMPHS PCT: 6 %
MCH: 32.3 pg (ref 26.0–34.0)
MCHC: 32.1 g/dL (ref 30.0–36.0)
MCV: 100.6 fL — AB (ref 78.0–100.0)
Monocytes Absolute: 0.7 10*3/uL (ref 0.1–1.0)
Monocytes Relative: 6 %
NEUTROS PCT: 86 %
Neutro Abs: 10.2 10*3/uL — ABNORMAL HIGH (ref 1.7–7.7)
Platelets: 146 10*3/uL — ABNORMAL LOW (ref 150–400)
RBC: 3.22 MIL/uL — ABNORMAL LOW (ref 4.22–5.81)
RDW: 16.3 % — ABNORMAL HIGH (ref 11.5–15.5)
WBC: 11.9 10*3/uL — AB (ref 4.0–10.5)

## 2016-10-22 LAB — BASIC METABOLIC PANEL
BUN: 26 mg/dL — AB (ref 4–21)
BUN: 26 mg/dL — AB (ref 4–21)
Creatinine: 0.9 mg/dL (ref 0.6–1.3)
Creatinine: 0.9 mg/dL (ref <=1.3)
Glucose: 115 mg/dL
Glucose: 115 mg/dL
Potassium: 3.9 mmol/L (ref 3.4–5.3)
Potassium: 3.9 mmol/L (ref 3.4–5.3)
SODIUM: 138 mmol/L (ref 137–147)
Sodium: 138 mmol/L (ref 137–147)

## 2016-10-22 LAB — I-STAT TROPONIN, ED: TROPONIN I, POC: 0.07 ng/mL (ref 0.00–0.08)

## 2016-10-22 LAB — BRAIN NATRIURETIC PEPTIDE: B Natriuretic Peptide: 1305.6 pg/mL — ABNORMAL HIGH (ref 0.0–100.0)

## 2016-10-22 LAB — I-STAT CHEM 8, ED
BUN: 29 mg/dL — AB (ref 6–20)
CALCIUM ION: 1.19 mmol/L (ref 1.15–1.40)
CHLORIDE: 105 mmol/L (ref 101–111)
Creatinine, Ser: 1.1 mg/dL (ref 0.61–1.24)
GLUCOSE: 126 mg/dL — AB (ref 65–99)
HCT: 27 % — ABNORMAL LOW (ref 39.0–52.0)
Hemoglobin: 9.2 g/dL — ABNORMAL LOW (ref 13.0–17.0)
POTASSIUM: 4.1 mmol/L (ref 3.5–5.1)
SODIUM: 139 mmol/L (ref 135–145)
TCO2: 25 mmol/L (ref 0–100)

## 2016-10-22 LAB — CBC AND DIFFERENTIAL
HCT: 32 % — AB (ref 41–53)
HEMATOCRIT: 32 % — AB (ref 41–53)
HEMOGLOBIN: 10.7 g/dL — AB (ref 13.5–17.5)
HEMOGLOBIN: 10.7 g/dL — AB (ref 13.5–17.5)
Platelets: 173 10*3/uL (ref 150–399)
WBC: 11.5 10*3/mL
WBC: 11.5 10^3/mL

## 2016-10-22 MED ORDER — MIDODRINE HCL 5 MG PO TABS
5.0000 mg | ORAL_TABLET | Freq: Three times a day (TID) | ORAL | Status: DC
  Administered 2016-10-22: 5 mg via ORAL
  Filled 2016-10-22 (×2): qty 1

## 2016-10-22 NOTE — Discharge Instructions (Signed)
Please administer midodrine as scheduled and only skip if blood pressure is above [id number] systolic  Orthostatic hypotension can occur in up to 58 percent of patients with PD and can be aggravated or caused by antiparkinsonian agents including levodopa, dopamine agonists, and MAO-B inhibitors. --> discuss with neurologist

## 2016-10-22 NOTE — ED Provider Notes (Signed)
Patient had near syncopal event this morning.Lawrence Turner His blood pressure was reportedly 200/100. He became lightheaded with standing. His mitadrine was not given this past weekend as his blood pressure was high. He is asymptomatic presently except feels tired Patient is presently alert and awake GCS score 15. I discussed patient with Dr.Ganjiand very well. He states that  mitodrine should not be withheld from patient unless blood pressure is greater than [id number] systolic.Lawrence Turner He does not feel the patient requires inpatient stay and can be discharged back to skilled nursing facility. He will contact skilled nursing facility with parameters for administering and withholdingmitodrine   Orlie Dakin, MD 10/23/16 [phone]

## 2016-10-22 NOTE — ED Notes (Signed)
Called [facility] for update, stated next on list. Family and pt made aware.

## 2016-10-22 NOTE — ED Triage Notes (Signed)
Per EMS: Pt complaining of orthostatic hypotension. Per EMS, ongoing x 6 months. Pt with recent hospital admission for same. Pt from [place] here for assessment.

## 2016-10-22 NOTE — ED Provider Notes (Signed)
[facility] DEPT Provider Note   CSN: [id number] Arrival date & time: 10/22/16  1517     History   Chief Complaint Chief Complaint  Patient presents with  . Dizziness    HPI Lawrence Turner is a 83 y.o. male.  HPI Arrives via EMS accompanied by son Pt has been struggling with difficulty with labile bp for some time now, approx 6 mo or so per son,   [facility], spoke with a caregiver - Olen Pel? that was very familiar with pt, but they state that this is much worse than usual On Friday, they were checking orthostatic bp: Laying down: 200/x Sitting up: passed out (two men had to hold him down): 80/40 Today, this am - he fell around 7am, his bp was 200/105 - but did not hit anything The son was concerned about bp being low when he stood up  This afternoon, he wanted to go to bathroom, if sbp >140, no midodrine given When standing to commode, he passed out - so they brought to ED No seizure like activity  Reviewed recent admission 10/12/16 - midodrine BID added, thought to may need wheelchair only, palliative involved Secondary adrenal insufficiency, orthostatic hypotension - autonomic dysfunction with labile bp, parkinsons, seizures?, malnutrition, CHF High risk stress test - deferred to cardiology  Past Medical History:  Diagnosis Date  . Abnormality of gait 10/19/2015  . Anemia, unspecified   . Benign neoplasm of colon    pre-cancerous polyps removed at colonoscopy 2008  . Contact dermatitis and other eczema, due to unspecified cause   . Diverticulosis of colon (without mention of hemorrhage)   . Esophageal reflux   . Hypertrophy of prostate without urinary obstruction and other lower urinary tract symptoms (LUTS)   . Mononeuritis of unspecified site   . Orthostatic hypotension 04/18/2016  . Osteoarthrosis, unspecified whether generalized or localized, pelvic region and thigh   . Other B-complex deficiencies   . Other pulmonary embolism and infarction    following cholecystectomy 2003  . Parkinson disease ([place]) 10/19/2015  . Peripheral neuropathy ([place])   . Pure hypercholesterolemia   . Rosacea   . Seizures ([place]) 04/17/2016  . 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 Syncope and collapse 04/18/2016  . Ulcer of foot ([place]) 04/03/2016  . Unspecified constipation   . Unspecified essential hypertension   . Unspecified gastritis and gastroduodenitis without mention of hemorrhage 08/29/2006  . Unspecified hemorrhoids without mention of complication   . Unspecified vitamin D deficiency     Patient Active Problem List   Diagnosis Date Noted  . Dyspnea 10/15/2016  . CHF (congestive heart failure), NYHA class I, acute on chronic, diastolic ([place])   . Goals of care, counseling/discussion   . Palliative care by specialist   . Malnutrition of moderate degree 10/08/2016  . Elevated troponin   . Hypertensive urgency 10/07/2016  . Altered awareness, transient 10/07/2016  . Hypokalemia 10/02/2016  . B12 deficiency 09/24/2016  . Malaise and fatigue 08/21/2016  . CHF (congestive heart failure) ([place]) 05/22/2016  . Pressure ulcer, stage 2 04/21/2016  . Secondary adrenal insufficiency ([place]) 04/21/2016  . Syncope 04/19/2016  . Orthostatic hypotension 04/18/2016  . Seizures ([place]) 04/17/2016  . Ulcer of foot ([place]) 04/03/2016  . Weight loss 01/24/2016  . Hammer toe of left foot 10/25/2015  . Abnormality of gait 10/19/2015  . Left hip pain 08/09/2015  . Edema 03/15/2015  . Constipation 10/05/2014  . Corn 10/05/2014  . Parkinson's disease ([place])  02/02/2014  . Uncontrolled hypertension   . Esophageal reflux   . Peripheral neuropathy ([place])   . BPH (benign prostatic hyperplasia)   . Other B-complex deficiencies   . Normocytic anemia   . Unspecified vitamin D deficiency   . Pure hypercholesterolemia   . Benign neoplasm of colon     Past Surgical History:  Procedure Laterality Date  . CATARACT EXTRACTION W/ INTRAOCULAR LENS  IMPLANT Left 5/20//2013  . CHOLECYSTECTOMY, LAPAROSCOPIC  2003  . colonic polyp removal  1965  . COLONOSCOPY  08/29/2006   hemorrhoids, 3 small polyps (hyperplastic) & diverticulosis  . [place] Medications    Prior to Admission medications   Medication Sig Start Date End Date Taking? Authorizing Provider  acetaminophen (TYLENOL) 325 MG tablet Take 325 mg by mouth every 6 (six) hours as needed for mild pain. Take 2 tablets daily; take 2 every 4 hours as needed for pain 05/01/16   Historical Provider, MD  Amino Acids-Protein Hydrolys (FEEDING SUPPLEMENT, PRO-STAT SUGAR FREE 64,) LIQD Take 30 mLs by mouth 3 (three) times daily with meals.    Historical Provider, MD  carbidopa-levodopa (SINEMET CR) 50-200 MG tablet Take 1 tablet by mouth 3 (three) times daily. 08/07/16   Kathrynn Ducking, MD  Cobalamine Combinations (VITAMIN B12-FOLIC ACID) XX123456 MCG TABS Take by mouth. Take two tablets once a day    Historical Provider, MD  cyclobenzaprine (FLEXERIL) 5 MG tablet Take 1 tablet (5 mg total) by mouth 3 (three) times daily as needed for muscle spasms (muscle pain and stiffness). 10/12/16   Geradine Girt, DO  DULoxetine (CYMBALTA) 30 MG capsule Take 30 mg by mouth daily.     Historical Provider, MD  famotidine (PEPCID) 20 MG tablet Take 20 mg by mouth daily.    Historical Provider, MD  hydrocortisone (CORTEF) 10 MG tablet Take 1 tablet (10 mg total) by mouth at bedtime. 10/12/16   Geradine Girt, DO  ibuprofen (ADVIL,MOTRIN) 400 MG tablet Take 1 tablet (400 mg total) by mouth every 6 (six) hours as needed for moderate pain. 04/30/16   Domenic Polite, MD  lactulose (CHRONULAC) 10 GM/15ML solution Take 40 g by mouth daily as needed for mild constipation. Take 60 ml as needed for constipation     Historical Provider, MD  magnesium oxide (MAG-OX) 400 (241.3 Mg) MG tablet Take 0.5 tablets (200 mg total) by mouth daily. 10/13/16   Geradine Girt, DO  megestrol (MEGACE) 400  MG/10ML suspension Take 200 mg by mouth daily.     Historical Provider, MD  midodrine (PROAMATINE) 5 MG tablet Take 1 tablet (5 mg total) by mouth 2 (two) times daily with a meal. Take when you get up (8AMish) and again at 1 PM 10/12/16   Geradine Girt, DO  Multiple Vitamins-Minerals (THERA-M) TABS TAKE ONE TABLET BY MOUTH EVERY DAY (FORMULARY SUB. FOR DAILY VITE) 05/25/16   Estill Dooms, MD  Nutritional Supplements (BOOST PLUS PO) Take 1 Can by mouth. One can two times a day with meals    Historical Provider, MD  polyethylene glycol (MIRALAX / GLYCOLAX) packet Take 17 g by mouth daily as needed for mild constipation.    Historical Provider, MD  potassium chloride SA (K-DUR,KLOR-CON) 20 MEQ tablet Take 20 mEq by mouth. Take one tablet daily for potassium    Historical Provider, MD  psyllium (HYDROCIL/METAMUCIL) 95 % PACK Take 1 packet by mouth daily.    Historical  Provider, MD  zinc oxide 20 % ointment Apply 1 application topically 2 (two) times daily as needed for irritation.    Historical Provider, MD    Family History Family History  Problem Relation Age of Onset  . Cancer Mother     breast  . Pneumonia Father   . Cancer Brother     Social History Social History  Substance Use Topics  . Smoking status: Former Smoker    Years: 15.00    Types: Cigarettes    Quit date: 01/30/1963  . Smokeless tobacco: Never Used  . Alcohol use 1.2 oz/week    1 Glasses of wine, 1 Cans of beer per week     Comment: 14 per week     Allergies   Lyrica [pregabalin]   Review of Systems Review of Systems  Constitutional: Negative for fever.  Respiratory: Negative for shortness of breath.   Cardiovascular: Negative for chest pain.  Gastrointestinal: Negative for abdominal pain, diarrhea and vomiting.  Allergic/Immunologic: Negative for immunocompromised state.  All other systems reviewed and are negative.    Physical Exam Updated Vital Signs BP 190/98   Pulse 73   Temp 99.1 F (37.3 C)  (Oral)   Resp 14   SpO2 96%   Physical Exam  Constitutional: He appears well-developed. No distress.  Appears chronically ill  HENT:  Head: Normocephalic and atraumatic.  Left Ear: External ear normal.  Eyes: Conjunctivae are normal. Pupils are equal, round, and reactive to light. Right eye exhibits no discharge. Left eye exhibits no discharge.  Neck: Normal range of motion. Neck supple.  Cardiovascular: Normal rate, regular rhythm and intact distal pulses.   No murmur heard. Pulmonary/Chest: Effort normal and breath sounds normal. No respiratory distress.  Abdominal: Soft. Bowel sounds are normal. He exhibits no distension and no mass. There is no tenderness. There is no rebound and no guarding.  Musculoskeletal: He exhibits no edema.  Neurological: He is alert.  Slow speech, parkinson-like  Skin: Skin is warm. He is not diaphoretic.  Psychiatric: He has a normal mood and affect.  Nursing note and vitals reviewed.    ED Treatments / Results  Labs (all labs ordered are listed, but only abnormal results are displayed) Labs Reviewed  CBC WITH DIFFERENTIAL/PLATELET - Abnormal; Notable for the following:       Result Value   WBC 11.9 (*)    RBC 3.22 (*)    Hemoglobin 10.4 (*)    HCT 32.4 (*)    MCV 100.6 (*)    RDW 16.3 (*)    Platelets 146 (*)    Neutro Abs 10.2 (*)    All other components within normal limits  BRAIN NATRIURETIC PEPTIDE - Abnormal; Notable for the following:    B Natriuretic Peptide 1,305.6 (*)    All other components within normal limits  I-STAT CHEM 8, ED - Abnormal; Notable for the following:    BUN 29 (*)    Glucose, Bld 126 (*)    Hemoglobin 9.2 (*)    HCT 27.0 (*)    All other components within normal limits  I-STAT TROPOININ, ED    EKG  EKG Interpretation  Date/Time:  Monday October 22 2016 15:26:52 EST Ventricular Rate:  86 PR Interval:    QRS Duration: 96 QT Interval:  371 QTC Calculation: 444 R Axis:   -19 Text Interpretation:  Sinus  rhythm LVH with secondary repolarization abnormality No significant change since last tracing Confirmed by Winfred Leeds  MD, SAM [phone]) on  10/22/2016 3:33:09 PM Also confirmed by Winfred Leeds  MD, [place] [phone]), editor [address], Leda Gauze [phone])  on 10/22/2016 3:45:39 PM       Radiology Dg Chest 2 View  Result Date: 10/22/2016 CLINICAL DATA:  Shortness of breath. EXAM: CHEST  2 VIEW COMPARISON:  October 07, 2016 FINDINGS: Stable cardiomegaly. The hila, mediastinum, lungs, and pleura are otherwise unremarkable. IMPRESSION: No active cardiopulmonary disease. Electronically Signed   By: Dorise Bullion III M.D   On: 10/22/2016 16:48    Procedures Procedures (including critical care time)  Medications Ordered in ED Medications - No data to display   Initial Impression / Assessment and Plan / ED Course  I have reviewed the triage vital signs and the nursing notes.  Pertinent labs & imaging results that were available during my care of the patient were reviewed by me and considered in my medical decision making (see chart for details).     Patient has had the symptoms for several months, consistent with autonomic dysfunction likely secondary to Parkinson's disease but also currently being evaluated by endocrine for possible adrenal insufficiency Likely culprit today was secondary to discontinuation of Midodrine Patient currently does not have any chest pain or shortness of breath but did have an episode of shortness of breath during the presyncope event Due to this, EKG and i-STAT troponin obtained which were not suggestive of significant heart strain; less likely a arrhythmia However, patient has had progression of his congestive heart failure and his BNP is elevated Discussed this with the son who is aware Patient has had his Lasix recently discontinued due to hypotension Due to patient's difficulty with orthostatic, and lack of patient's symptoms of hypoxia, tachypnea, will hold from treating  pulmonary edema Spoke with Dr. Einar Gip - he recommends midodrine not be stopped unless bp >220 Discussed with son who feels comfortable with plan and first toe submitted drain given here Patient needs coordination of care between cardiologist, neurologist, and endocrinologists  Son feels comfortable following up closely and with plan Strict return precautions,d c in good condition   Final Clinical Impressions(s) / ED Diagnoses   Final diagnoses:  Postural dizziness with presyncope    New Prescriptions Discharge Medication List as of 10/22/2016  6:03 PM       Karma Greaser, MD 10/23/16 [id number]    Orlie Dakin, MD 10/25/16 1011

## 2016-10-23 ENCOUNTER — Encounter: Payer: Self-pay | Admitting: Internal Medicine

## 2016-10-23 ENCOUNTER — Encounter: Payer: Self-pay | Admitting: Endocrinology

## 2016-10-23 ENCOUNTER — Encounter: Payer: Self-pay | Admitting: Nurse Practitioner

## 2016-10-23 ENCOUNTER — Encounter: Payer: Self-pay | Admitting: Neurology

## 2016-10-23 NOTE — Progress Notes (Signed)
Location:  [facility] Room Number: 26 Place of Service:  SNF (31) Provider:  Mast, Manxie   NP  Jeanmarie Hubert, MD  Patient Care Team: Estill Dooms, MD as PCP - General (Internal Medicine) Man Otho Darner, NP as Nurse Practitioner (Nurse Practitioner) Kathrynn Ducking, MD as Consulting Physician (Neurology)  Extended Emergency Contact Information Primary Emergency Contact: Lucita Ferrara Address: [address]           [place], [place] [address] [name] of [place] Phone: [phone] Relation: Spouse Secondary Emergency Contact: Berroa,Clifford  [place] Phone: [phone] Relation: Son  Code Status:  DNR Goals of care: Advanced Directive information Advanced Directives 10/23/2016  Does Patient Have a Medical Advance Directive? Yes  Type of Paramedic of [place];Living will;Out of facility DNR (pink MOST or yellow form)  Does patient want to make changes to medical advance directive? No - Patient declined  Copy of [place] in Chart? Yes  Pre-existing out of facility DNR order (yellow form or pink MOST form) Yellow form placed in chart (order not valid for inpatient use)     Chief Complaint  Patient presents with  . Acute Visit    Elevated B/P    HPI:  Pt is a 83 y.o. male seen today for an acute visit for    Past Medical History:  Diagnosis Date  . Abnormality of gait 10/19/2015  . Anemia, unspecified   . Benign neoplasm of colon    pre-cancerous polyps removed at colonoscopy 2008  . Contact dermatitis and other eczema, due to unspecified cause   . Diverticulosis of colon (without mention of hemorrhage)   . Esophageal reflux   . Hypertrophy of prostate without urinary obstruction and other lower urinary tract symptoms (LUTS)   . Mononeuritis of unspecified site   . Orthostatic hypotension 04/18/2016  . Osteoarthrosis, unspecified whether generalized or localized,  pelvic region and thigh   . Other B-complex deficiencies   . Other pulmonary embolism and infarction    following cholecystectomy 2003  . Parkinson disease ([place]) 10/19/2015  . Peripheral neuropathy ([place])   . Pure hypercholesterolemia   . Rosacea   . Seizures ([place]) 04/17/2016  . 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 Syncope and collapse 04/18/2016  . Ulcer of foot ([place]) 04/03/2016  . Unspecified constipation   . Unspecified essential hypertension   . Unspecified gastritis and gastroduodenitis without mention of hemorrhage 08/29/2006  . Unspecified hemorrhoids without mention of complication   . Unspecified vitamin D deficiency    Past Surgical History:  Procedure Laterality Date  . CATARACT EXTRACTION W/ INTRAOCULAR LENS IMPLANT Left 5/20//2013  . CHOLECYSTECTOMY, LAPAROSCOPIC  2003  . colonic polyp removal  1965  . COLONOSCOPY  08/29/2006   hemorrhoids, 3 small polyps (hyperplastic) & diverticulosis  . HEMORRHOID SURGERY  1953    Allergies  Allergen Reactions  . Lyrica [Pregabalin] Other (See Comments)    *Anticonvulsants*, fatique    Allergies as of 10/23/2016      Reactions   Lyrica [pregabalin] Other (See Comments)   *Anticonvulsants*, fatique      Medication List       Accurate as of 10/23/16 11:03 AM. Always use your most recent med list.          acetaminophen 325 MG tablet Commonly known as:  TYLENOL Take 325 mg by mouth every 6 (six) hours as needed for mild pain.  Take 2 tablets daily; take 2 every 4 hours as needed for pain   BOOST PLUS PO Take 1 Can by mouth. One can two times a day with meals   carbidopa-levodopa 50-200 MG tablet Commonly known as:  SINEMET CR Take 1 tablet by mouth 3 (three) times daily.   cyclobenzaprine 5 MG tablet Commonly known as:  FLEXERIL Take 1 tablet (5 mg total) by mouth 3 (three) times daily as needed for muscle spasms (muscle pain and stiffness).   DULoxetine 30 MG capsule Commonly known as:   CYMBALTA Take 30 mg by mouth daily.   famotidine 20 MG tablet Commonly known as:  PEPCID Take 20 mg by mouth daily.   feeding supplement (PRO-STAT SUGAR FREE 64) Liqd Take 30 mLs by mouth 3 (three) times daily with meals.   hydrocortisone 10 MG tablet Commonly known as:  CORTEF Take 1 tablet (10 mg total) by mouth at bedtime.   ibuprofen 400 MG tablet Commonly known as:  ADVIL,MOTRIN Take 1 tablet (400 mg total) by mouth every 6 (six) hours as needed for moderate pain.   lactulose 10 GM/15ML solution Commonly known as:  CHRONULAC Take 40 g by mouth daily as needed for mild constipation. Take 60 ml as needed for constipation   magnesium oxide 400 (241.3 Mg) MG tablet Commonly known as:  MAG-OX Take 0.5 tablets (200 mg total) by mouth daily.   megestrol 400 MG/10ML suspension Commonly known as:  MEGACE Take 200 mg by mouth daily.   midodrine 5 MG tablet Commonly known as:  PROAMATINE Take 1 tablet (5 mg total) by mouth 2 (two) times daily with a meal. Take when you get up (8AMish) and again at 1 PM   polyethylene glycol packet Commonly known as:  MIRALAX / GLYCOLAX Take 17 g by mouth daily as needed for mild constipation.   potassium chloride SA 20 MEQ tablet Commonly known as:  K-DUR,KLOR-CON Take 20 mEq by mouth. Take one tablet daily for potassium   psyllium 95 % Pack Commonly known as:  HYDROCIL/METAMUCIL Take 1 packet by mouth daily.   THERA-M Tabs TAKE ONE TABLET BY MOUTH EVERY DAY (FORMULARY SUB. FOR DAILY VITE)   Vitamin B12-Folic Acid XX123456 MCG Tabs Take by mouth. Take two tablets once a day   zinc oxide 20 % ointment Apply 1 application topically 2 (two) times daily as needed for irritation.       Review of Systems  Immunization History  Administered Date(s) Administered  . DTaP 09/01/2007  . Influenza-Unspecified 07/04/2014, 05/19/2015, 06/01/2016  . Pneumococcal Polysaccharide-23 06/11/2002   Pertinent  Health Maintenance Due  Topic Date  Due  . PNA vac Low Risk Adult (2 of 2 - PCV13) 06/12/2003  . INFLUENZA VACCINE  Completed   Fall Risk  04/03/2016 02/14/2016 01/24/2016 01/24/2016 10/25/2015  Falls in the past year? No No Yes No No  Number falls in past yr: - - 2 or more - -  Injury with Fall? - - No - -  Risk Factor Category  - - - - -  Risk for fall due to : - - - - -  Risk for fall due to (comments): - - - - -  Follow up - - - - -   Functional Status Survey:    Vitals:   10/23/16 1044  BP: (!) 194/102  Pulse: 86  Resp: 20  Temp: 97.6 F (36.4 C)  Weight: 144 lb (65.3 kg)  Height: 5\' 10"  (1.778 m)  Body mass index is 20.66 kg/m. Physical Exam  Labs reviewed:  Recent Labs  04/19/16 0434  10/08/16 1200 10/09/16 0536 10/10/16 0519 10/12/16 0441 10/22/16 10/22/16 1628  NA 141  < >  --  142 141 134* 138 139  K 4.1  < >  --  3.4* 3.8 3.3* 3.9 4.1  CL 111  < >  --  110 109 103  --  105  CO2 22  < >  --  24 25 25   --   --   GLUCOSE 96  < >  --  120* 111* 105*  --  126*  BUN 26*  < >  --  31* 29* 22* 26* 29*  CREATININE 1.16  < >  --  1.04 1.07 0.96 0.9 1.10  CALCIUM 9.0  < >  --  8.5* 8.3* 8.1*  --   --   MG 2.1  --  1.9  --   --   --   --   --   < > = values in this interval not displayed.  Recent Labs  05/08/16 1653  05/24/16 1500 08/23/16 10/07/16 1454  AST 27  < > 29 14 30   ALT 30  < > 10 6* 23  ALKPHOS 39  < > 39 26 37*  BILITOT 0.3  --  0.5  --  1.2  PROT 5.2*  --  5.6*  --  5.1*  ALBUMIN 3.2*  --  3.0*  --  2.9*  < > = values in this interval not displayed.  Recent Labs  04/23/16 0357 04/24/16 0654  10/10/16 0519 10/12/16 0441 10/22/16 10/22/16 1616 10/22/16 1628  WBC 9.3 10.0  < > 11.3* 14.1* 11.5 11.9*  --   NEUTROABS 7.2 8.0*  --   --   --   --  10.2*  --   HGB 10.4* 10.7*  < > 10.0* 11.3* 10.7* 10.4* 9.2*  HCT 31.0* 32.6*  < > 30.9* 34.4* 32* 32.4* 27.0*  MCV 94.2 94.8  < > 98.7 97.5  --  100.6*  --   PLT 164 149*  < > 156 170 173 146*  --   < > = values in this interval  not displayed. Lab Results  Component Value Date   TSH 0.918 10/07/2016   No results found for: HGBA1C Lab Results  Component Value Date   CHOL 166 05/24/2014   HDL 45 05/24/2014   LDLCALC 110 05/24/2014   TRIG 54 05/24/2014    Significant Diagnostic Results in last 30 days:  Dg Chest 2 View  Result Date: 10/22/2016 CLINICAL DATA:  Shortness of breath. EXAM: CHEST  2 VIEW COMPARISON:  October 07, 2016 FINDINGS: Stable cardiomegaly. The hila, mediastinum, lungs, and pleura are otherwise unremarkable. IMPRESSION: No active cardiopulmonary disease. Electronically Signed   By: Dorise Bullion III M.D   On: 10/22/2016 16:48   Ct Head Wo Contrast  Result Date: 10/07/2016 CLINICAL DATA:  Aphasia EXAM: CT HEAD WITHOUT CONTRAST TECHNIQUE: Contiguous axial images were obtained from the base of the skull through the vertex without intravenous contrast. COMPARISON:  None. FINDINGS: Brain: There is no evidence for acute hemorrhage, hydrocephalus, mass lesion, or abnormal extra-axial fluid collection. No definite CT evidence for acute infarction. Diffuse loss of parenchymal volume is consistent with atrophy. Patchy low attenuation in the deep hemispheric and periventricular white matter is nonspecific, but likely reflects chronic microvascular ischemic demyelination. Vascular: No hyperdense vessel or unexpected calcification. Skull: No evidence for fracture.  No worrisome lytic or sclerotic lesion. Sinuses/Orbits: The visualized paranasal sinuses and mastoid air cells are clear. Visualized portions of the globes and intraorbital fat are unremarkable. Other: None. IMPRESSION: 1. No acute intracranial abnormality. 2. Atrophy with chronic small vessel white matter ischemic disease. Electronically Signed   By: Misty Stanley M.D.   On: 10/07/2016 16:57   Ct Chest Wo Contrast  Result Date: 10/08/2016 CLINICAL DATA:  Shortness of breath. Lung nodule. Abnormal chest x-ray. EXAM: CT CHEST WITHOUT CONTRAST  TECHNIQUE: Multidetector CT imaging of the chest was performed following the standard protocol without IV contrast. COMPARISON:  None. FINDINGS: Cardiovascular: The heart size is normal. Blood pool is hypodense, compatible with known anemia. Coronary artery calcifications are present. The aorta and pulmonary arteries are within normal limits. Mediastinum/Nodes: No significant mediastinal or axillary adenopathy is present. The thoracic inlet is normal. The esophagus is dilated throughout its course in the chest. Lungs/Pleura: Centrilobular emphysema is present. There is no focal lung nodule to correspond to the finding on the chest x-ray. Finding corresponds with overlap of a posterior right rib and the scapula. Linear scarring or atelectasis is present at the left base. No significant pleural effusion or pneumothorax is present. Upper Abdomen: Cholecystectomy is noted. Limited imaging the upper abdomen demonstrates moderate distention of the stomach. No other focal lesions are present. Musculoskeletal: Fusion of anterior osteophytes throughout the thoracic spine is compatible with DISH. Vertebral body heights are maintained. No focal lytic or blastic lesions are evident. IMPRESSION: 1. No pulmonary nodule.  The finding was likely artifactual. 2. Mild emphysema. 3. Mild atherosclerotic cysts including coronary artery disease. 4. Dilation of the esophagus throughout the mediastinum suggesting esophageal dysmotility. No mass lesion is present. 5. Fusion of anterior osteophytes compatible with DISH. Electronically Signed   By: San Morelle M.D.   On: 10/08/2016 20:35   Nm Myocar Multi W/spect W/wall Motion / Ef  Result Date: 10/11/2016 CLINICAL DATA:  Congestive heart failure.  Hypertension. EXAM: MYOCARDIAL IMAGING WITH SPECT (REST AND EXERCISE) GATED LEFT VENTRICULAR WALL MOTION STUDY LEFT VENTRICULAR EJECTION FRACTION TECHNIQUE: Standard myocardial SPECT imaging was performed after resting intravenous  injection of 10 mCi Tc-55m tetrofosmin. Subsequently, exercise tolerance test was performed by the patient under the supervision of the Cardiology staff. At peak-stress, 30 mCi Tc-82m tetrofosmin was injected intravenously and standard myocardial SPECT imaging was performed. Quantitative gated imaging was also performed to evaluate left ventricular wall motion, and estimate left ventricular ejection fraction. COMPARISON:  CT the chest 10/08/2016. FINDINGS: Perfusion: There is a fixed defect at the base PA reversible ischemia is noted in the anterior wall and septum. Wall Motion: Global hypokinesia is present. The left ventricle is not enlarged. Left Ventricular Ejection Fraction: 23 % End diastolic volume 123456 ml End systolic volume 81 ml IMPRESSION: 1. Reversible ischemia within the anterior wall and septum. The fixed infarct is present along the base. 2. Global hypokinesia is present. 3. Left ventricular ejection fraction 23% 4. Non invasive risk stratification*: High risk *2012 Appropriate Use Criteria for Coronary Revascularization Focused Update: J Am Coll Cardiol. [id number]. http://content.airportbarriers.com.aspx?articleid=[email or web address] Electronically Signed   By: San Morelle M.D.   On: 10/11/2016 12:10   Dg Chest Port 1 View  Result Date: 10/07/2016 CLINICAL DATA:  Acute onset of aphasia. Loss of consciousness. Chronic lethargy. Initial encounter. EXAM: PORTABLE CHEST 1 VIEW COMPARISON:  Chest radiograph performed 04/18/2016 FINDINGS: The lungs are well-aerated. There is an 8 mm nodular density at the right midlung zone. This may be within  the overlying scapula or rib, though no definite similar density was identified on the prior study. There is no evidence of pleural effusion or pneumothorax. The cardiomediastinal silhouette is within normal limits. No acute osseous abnormalities are seen. IMPRESSION: 1. No acute cardiopulmonary process seen. 2. Question of 8 mm nodule at the right  midlung zone. CT of the chest would be helpful for further evaluation, when and as deemed clinically appropriate. Electronically Signed   By: Garald Balding M.D.   On: 10/07/2016 17:14    Assessment/Plan There are no diagnoses linked to this encounter.   Family/ staff Communication:   Labs/tests ordered:    This encounter was created in error - please disregard.

## 2016-10-24 DIAGNOSIS — R531 Weakness: Secondary | ICD-10-CM | POA: Diagnosis not present

## 2016-10-25 ENCOUNTER — Encounter: Payer: Self-pay | Admitting: Internal Medicine

## 2016-10-26 ENCOUNTER — Telehealth: Payer: Self-pay

## 2016-10-26 NOTE — Telephone Encounter (Signed)
Per patient's son Dr.Green will see his father on Monday and further address concerns (review all medications). Dr.Green responded via mychart.

## 2016-10-26 NOTE — Telephone Encounter (Signed)
Patient with orthostatic hypotension x 2 weeks. Patient is deconditioning and unable to stand with out passing out. Patient's son is requesting a through review of patient's medications to see if any are contributing to this condition.  Please review and advise

## 2016-10-29 ENCOUNTER — Encounter: Payer: Self-pay | Admitting: Internal Medicine

## 2016-10-29 ENCOUNTER — Non-Acute Institutional Stay (SKILLED_NURSING_FACILITY): Payer: Medicare Other | Admitting: Internal Medicine

## 2016-10-29 DIAGNOSIS — R634 Abnormal weight loss: Secondary | ICD-10-CM

## 2016-10-29 DIAGNOSIS — R609 Edema, unspecified: Secondary | ICD-10-CM

## 2016-10-29 DIAGNOSIS — I951 Orthostatic hypotension: Secondary | ICD-10-CM

## 2016-10-29 DIAGNOSIS — I509 Heart failure, unspecified: Secondary | ICD-10-CM | POA: Diagnosis not present

## 2016-10-29 DIAGNOSIS — G20A1 Parkinson's disease without dyskinesia, without mention of fluctuations: Secondary | ICD-10-CM

## 2016-10-29 DIAGNOSIS — G20 Parkinson's disease: Secondary | ICD-10-CM | POA: Diagnosis not present

## 2016-10-29 DIAGNOSIS — D649 Anemia, unspecified: Secondary | ICD-10-CM | POA: Diagnosis not present

## 2016-10-29 DIAGNOSIS — E2749 Other adrenocortical insufficiency: Secondary | ICD-10-CM

## 2016-10-29 NOTE — Progress Notes (Signed)
Progress Note    Location:  [facility] Room Number: N36 Place of Service:  SNF [phone]) Provider:  Jeanmarie Hubert, MD  Patient Care Team: Estill Dooms, MD as PCP - General (Internal Medicine) Man Otho Darner, NP as Nurse Practitioner (Nurse Practitioner) Kathrynn Ducking, MD as Consulting Physician (Neurology)  Extended Emergency Contact Information Primary Emergency Contact: Lucita Ferrara Address: [address]           [place], [place] [address] [name] of [place] Phone: [phone] Relation: Spouse Secondary Emergency Contact: Pottenger,Clifford  [place] Mobile Phone: [phone] Relation: Son  Code Status:  DNR Goals of care: Advanced Directive information Advanced Directives 10/29/2016  Does Patient Have a Medical Advance Directive? Yes  Type of Paramedic of [place];Living will;Out of facility DNR (pink MOST or yellow form)  Does patient want to make changes to medical advance directive? -  Copy of [place] in Chart? Yes  Pre-existing out of facility DNR order (yellow form or pink MOST form) Yellow form placed in chart (order not valid for inpatient use)     Chief Complaint  Patient presents with  . Acute Visit    orthostatic hypotension    HPI:  Pt is a 83 y.o. male seen today for medical management of chronic diseases.  Patient continues to have wide variations in his BP. It is so low sometimes that it seems associated with changes in alertness and absence spells or loss of consciousness. It is impairing his ability to participate fully in PT. BP is so high at times that staff worries he may have a stroke. His son has spoken with his consulting physicians at the hospital and he believes that his father is to be upright much of the day because most of the high BP are sassociated with recumbent positions.   Patient is currently on midodrine ordered by Dr. Einar Gip.  Parkinsonism  seems stable.  Anemia is stable.  BNP haas risen to >1100.   Past Medical History:  Diagnosis Date  . Abnormality of gait 10/19/2015  . Anemia, unspecified   . Benign neoplasm of colon    pre-cancerous polyps removed at colonoscopy 2008  . Contact dermatitis and other eczema, due to unspecified cause   . Diverticulosis of colon (without mention of hemorrhage)   . Esophageal reflux   . Hypertrophy of prostate without urinary obstruction and other lower urinary tract symptoms (LUTS)   . Mononeuritis of unspecified site   . Orthostatic hypotension 04/18/2016  . Osteoarthrosis, unspecified whether generalized or localized, pelvic region and thigh   . Other B-complex deficiencies   . Other pulmonary embolism and infarction    following cholecystectomy 2003  . Parkinson disease ([place]) 10/19/2015  . Peripheral neuropathy ([place])   . Pure hypercholesterolemia   . Rosacea   . Seizures ([place]) 04/17/2016  . 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 Syncope and collapse 04/18/2016  . Ulcer of foot ([place]) 04/03/2016  . Unspecified constipation   . Unspecified essential hypertension   . Unspecified gastritis and gastroduodenitis without mention of hemorrhage 08/29/2006  . Unspecified hemorrhoids without mention of complication   . Unspecified vitamin D deficiency    Past Surgical History:  Procedure Laterality Date  . CATARACT EXTRACTION W/ INTRAOCULAR LENS IMPLANT Left 5/20//2013  . CHOLECYSTECTOMY, LAPAROSCOPIC  2003  . colonic polyp removal  1965  . COLONOSCOPY  08/29/2006   hemorrhoids, 3  small polyps (hyperplastic) & diverticulosis  . HEMORRHOID SURGERY  1953    Allergies  Allergen Reactions  . Lyrica [Pregabalin] Other (See Comments)    *Anticonvulsants*, fatique    Allergies as of 10/29/2016      Reactions   Lyrica [pregabalin] Other (See Comments)   *Anticonvulsants*, fatique      Medication List       Accurate as of 10/29/16 11:33 AM. Always use your  most recent med list.          acetaminophen 325 MG tablet Commonly known as:  TYLENOL Take 325 mg by mouth every 4 (four) hours as needed for mild pain. Take 2 tablets daily; take 2 every 4 hours as needed for pain   BOOST PLUS PO Take 1 Can by mouth. One can two times a day with meals   carbidopa-levodopa 50-200 MG tablet Commonly known as:  SINEMET CR Take 1 tablet by mouth 3 (three) times daily.   cyclobenzaprine 5 MG tablet Commonly known as:  FLEXERIL Take 1 tablet (5 mg total) by mouth 3 (three) times daily as needed for muscle spasms (muscle pain and stiffness).   DULoxetine 30 MG capsule Commonly known as:  CYMBALTA Take 30 mg by mouth daily.   famotidine 20 MG tablet Commonly known as:  PEPCID Take 20 mg by mouth daily.   feeding supplement (PRO-STAT SUGAR FREE 64) Liqd Take 30 mLs by mouth 3 (three) times daily with meals.   hydrocortisone 10 MG tablet Commonly known as:  CORTEF Take 1 tablet (10 mg total) by mouth at bedtime.   ibuprofen 400 MG tablet Commonly known as:  ADVIL,MOTRIN Take 1 tablet (400 mg total) by mouth every 6 (six) hours as needed for moderate pain.   lactulose 10 GM/15ML solution Commonly known as:  CHRONULAC Take 40 g by mouth daily as needed for mild constipation. Take 60 ml as needed for constipation   magnesium oxide 400 (241.3 Mg) MG tablet Commonly known as:  MAG-OX Take 0.5 tablets (200 mg total) by mouth daily.   megestrol 400 MG/10ML suspension Commonly known as:  MEGACE Take 200 mg by mouth daily.   midodrine 5 MG tablet Commonly known as:  PROAMATINE Take 1 tablet (5 mg total) by mouth 2 (two) times daily with a meal. Take when you get up (8AMish) and again at 1 PM   polyethylene glycol packet Commonly known as:  MIRALAX / GLYCOLAX Take 17 g by mouth daily as needed for mild constipation.   potassium chloride SA 20 MEQ tablet Commonly known as:  K-DUR,KLOR-CON Take 20 mEq by mouth. Take one tablet daily for  potassium   psyllium 95 % Pack Commonly known as:  HYDROCIL/METAMUCIL Take 1 packet by mouth daily.   THERA-M Tabs TAKE ONE TABLET BY MOUTH EVERY DAY (FORMULARY SUB. FOR DAILY VITE)   Vitamin B12-Folic Acid 100-712 MCG Tabs Take by mouth. Take two tablets once a day   zinc oxide 20 % ointment Apply 1 application topically 2 (two) times daily as needed for irritation.       Review of Systems  Constitutional: Positive for activity change and fatigue. Negative for chills, diaphoresis and fever.  HENT: Negative for congestion, ear pain and hearing loss.   Eyes: Negative for photophobia, pain, discharge and redness.  Respiratory: Positive for shortness of breath. Negative for cough and wheezing.   Cardiovascular: Positive for leg swelling (both lower legs ). Negative for chest pain and palpitations.  Postural drops in BP. Very high BP at times. Hx CHF  Gastrointestinal: Positive for constipation. Negative for abdominal pain, blood in stool, diarrhea and nausea.  Endocrine:       B12 deficiency on oral supplements  Genitourinary: Negative.   Musculoskeletal: Positive for arthralgias. Negative for back pain, myalgias and neck pain.  Skin:       Multiple seborrheic keratoses.   Neurological: Positive for tremors and weakness. Negative for seizures and headaches.       Complaint of loss of motor skills and changes in. He is unstable with walking. He has a known peripheral neuropathy of uncertain origin. Micrographia. Pressured, soft speech that is nearly inaudible. History of Parkinsonism currently treated with Sinemet.  Hematological:       History of anemia  Psychiatric/Behavioral: Positive for confusion and decreased concentration. The patient is nervous/anxious.     Immunization History  Administered Date(s) Administered  . DTaP 09/01/2007  . Influenza-Unspecified 07/04/2014, 05/19/2015, 06/01/2016  . Pneumococcal Polysaccharide-23 06/11/2002   Pertinent  Health  Maintenance Due  Topic Date Due  . PNA vac Low Risk Adult (2 of 2 - PCV13) 06/12/2003  . INFLUENZA VACCINE  Completed   Fall Risk  04/03/2016 02/14/2016 01/24/2016 01/24/2016 10/25/2015  Falls in the past year? No No Yes No No  Number falls in past yr: - - 2 or more - -  Injury with Fall? - - No - -  Risk Factor Category  - - - - -  Risk for fall due to : - - - - -  Risk for fall due to (comments): - - - - -  Follow up - - - - -   Functional Status Survey:    Vitals:   10/29/16 1034  BP: (!) 149/78  Pulse: 86  Resp: 18  Temp: 97.8 F (36.6 C)  SpO2: 96%  Weight: 141 lb 4.8 oz (64.1 kg)  Height: 5\' 10"  (1.778 m)   Body mass index is 20.27 kg/m.  Wt Readings from Last 3 Encounters:  10/29/16 141 lb 4.8 oz (64.1 kg)  10/23/16 144 lb (65.3 kg)  10/15/16 144 lb (65.3 kg)    Physical Exam  Constitutional: He is oriented to person, place, and time.  Thin. Elderly. Chronically weaka nd tired.  HENT:  Right Ear: External ear normal.  Left Ear: External ear normal.  Nose: Nose normal.  Mouth/Throat: Oropharynx is clear and moist. No oropharyngeal exudate.  Eyes: Conjunctivae are normal. Pupils are equal, round, and reactive to light.  Neck: No JVD present. No tracheal deviation present. No thyromegaly present.  Cardiovascular: Normal rate, regular rhythm, normal heart sounds and intact distal pulses.  Exam reveals no gallop and no friction rub.   No murmur heard. Pulmonary/Chest: No respiratory distress. He has no wheezes. He has no rales. He exhibits no tenderness.  Abdominal: He exhibits no distension and no mass. There is no tenderness.  Musculoskeletal: Normal range of motion. He exhibits edema (1+ ipedal) and tenderness.  unstable gait. Trace edema BLE. Pain with movement of the left hip and thigh  Lymphadenopathy:    He has no cervical adenopathy.  Neurological: He is alert and oriented to person, place, and time. He displays abnormal reflex (Absent at both knees). No  cranial nerve deficit. Coordination normal.  Cogwheeling most evident in the right wrist. Gait is mildly abnormal with a suggestion of a festinating gait. Speech is soft and mildly pressured. There is a mild tremor at rest. There is no  focal weakness. 1/14//15 MMSE 30/30. Passed clock drawing test.  Skin: No rash noted. No erythema. No pallor.  Healed corn at the plantar aspect of the the left 5th MTJ  Psychiatric:  Patient appears mildly anxious.     Labs reviewed:  Recent Labs  04/19/16 0434  10/08/16 1200 10/09/16 0536 10/10/16 0519 10/12/16 0441 10/22/16 10/22/16 1628  NA 141  < >  --  142 141 134* 138 139  K 4.1  < >  --  3.4* 3.8 3.3* 3.9 4.1  CL 111  < >  --  110 109 103  --  105  CO2 22  < >  --  24 25 25   --   --   GLUCOSE 96  < >  --  120* 111* 105*  --  126*  BUN 26*  < >  --  31* 29* 22* 26* 29*  CREATININE 1.16  < >  --  1.04 1.07 0.96 0.9 1.10  CALCIUM 9.0  < >  --  8.5* 8.3* 8.1*  --   --   MG 2.1  --  1.9  --   --   --   --   --   < > = values in this interval not displayed.  Recent Labs  05/08/16 1653  05/24/16 1500 08/23/16 10/07/16 1454  AST 27  < > 29 14 30   ALT 30  < > 10 6* 23  ALKPHOS 39  < > 39 26 37*  BILITOT 0.3  --  0.5  --  1.2  PROT 5.2*  --  5.6*  --  5.1*  ALBUMIN 3.2*  --  3.0*  --  2.9*  < > = values in this interval not displayed.  Recent Labs  04/23/16 0357 04/24/16 0654  10/10/16 0519 10/12/16 0441 10/22/16 10/22/16 1616 10/22/16 1628  WBC 9.3 10.0  < > 11.3* 14.1* 11.5 11.9*  --   NEUTROABS 7.2 8.0*  --   --   --   --  10.2*  --   HGB 10.4* 10.7*  < > 10.0* 11.3* 10.7* 10.4* 9.2*  HCT 31.0* 32.6*  < > 30.9* 34.4* 32* 32.4* 27.0*  MCV 94.2 94.8  < > 98.7 97.5  --  100.6*  --   PLT 164 149*  < > 156 170 173 146*  --   < > = values in this interval not displayed. Lab Results  Component Value Date   TSH 0.918 10/07/2016   No results found for: HGBA1C Lab Results  Component Value Date   CHOL 166 05/24/2014   HDL 45  05/24/2014   LDLCALC 110 05/24/2014   TRIG 54 05/24/2014    Significant Diagnostic Results in last 30 days:  Dg Chest 2 View  Result Date: 10/22/2016 CLINICAL DATA:  Shortness of breath. EXAM: CHEST  2 VIEW COMPARISON:  October 07, 2016 FINDINGS: Stable cardiomegaly. The hila, mediastinum, lungs, and pleura are otherwise unremarkable. IMPRESSION: No active cardiopulmonary disease. Electronically Signed   By: Dorise Bullion III M.D   On: 10/22/2016 16:48   Ct Head Wo Contrast  Result Date: 10/07/2016 CLINICAL DATA:  Aphasia EXAM: CT HEAD WITHOUT CONTRAST TECHNIQUE: Contiguous axial images were obtained from the base of the skull through the vertex without intravenous contrast. COMPARISON:  None. FINDINGS: Brain: There is no evidence for acute hemorrhage, hydrocephalus, mass lesion, or abnormal extra-axial fluid collection. No definite CT evidence for acute infarction. Diffuse loss of parenchymal volume is consistent with  atrophy. Patchy low attenuation in the deep hemispheric and periventricular white matter is nonspecific, but likely reflects chronic microvascular ischemic demyelination. Vascular: No hyperdense vessel or unexpected calcification. Skull: No evidence for fracture. No worrisome lytic or sclerotic lesion. Sinuses/Orbits: The visualized paranasal sinuses and mastoid air cells are clear. Visualized portions of the globes and intraorbital fat are unremarkable. Other: None. IMPRESSION: 1. No acute intracranial abnormality. 2. Atrophy with chronic small vessel white matter ischemic disease. Electronically Signed   By: Misty Stanley M.D.   On: 10/07/2016 16:57   Ct Chest Wo Contrast  Result Date: 10/08/2016 CLINICAL DATA:  Shortness of breath. Lung nodule. Abnormal chest x-ray. EXAM: CT CHEST WITHOUT CONTRAST TECHNIQUE: Multidetector CT imaging of the chest was performed following the standard protocol without IV contrast. COMPARISON:  None. FINDINGS: Cardiovascular: The heart size is normal.  Blood pool is hypodense, compatible with known anemia. Coronary artery calcifications are present. The aorta and pulmonary arteries are within normal limits. Mediastinum/Nodes: No significant mediastinal or axillary adenopathy is present. The thoracic inlet is normal. The esophagus is dilated throughout its course in the chest. Lungs/Pleura: Centrilobular emphysema is present. There is no focal lung nodule to correspond to the finding on the chest x-ray. Finding corresponds with overlap of a posterior right rib and the scapula. Linear scarring or atelectasis is present at the left base. No significant pleural effusion or pneumothorax is present. Upper Abdomen: Cholecystectomy is noted. Limited imaging the upper abdomen demonstrates moderate distention of the stomach. No other focal lesions are present. Musculoskeletal: Fusion of anterior osteophytes throughout the thoracic spine is compatible with DISH. Vertebral body heights are maintained. No focal lytic or blastic lesions are evident. IMPRESSION: 1. No pulmonary nodule.  The finding was likely artifactual. 2. Mild emphysema. 3. Mild atherosclerotic cysts including coronary artery disease. 4. Dilation of the esophagus throughout the mediastinum suggesting esophageal dysmotility. No mass lesion is present. 5. Fusion of anterior osteophytes compatible with DISH. Electronically Signed   By: San Morelle M.D.   On: 10/08/2016 20:35   Nm Myocar Multi W/spect W/wall Motion / Ef  Result Date: 10/11/2016 CLINICAL DATA:  Congestive heart failure.  Hypertension. EXAM: MYOCARDIAL IMAGING WITH SPECT (REST AND EXERCISE) GATED LEFT VENTRICULAR WALL MOTION STUDY LEFT VENTRICULAR EJECTION FRACTION TECHNIQUE: Standard myocardial SPECT imaging was performed after resting intravenous injection of 10 mCi Tc-34m tetrofosmin. Subsequently, exercise tolerance test was performed by the patient under the supervision of the Cardiology staff. At peak-stress, 30 mCi Tc-49m  tetrofosmin was injected intravenously and standard myocardial SPECT imaging was performed. Quantitative gated imaging was also performed to evaluate left ventricular wall motion, and estimate left ventricular ejection fraction. COMPARISON:  CT the chest 10/08/2016. FINDINGS: Perfusion: There is a fixed defect at the base PA reversible ischemia is noted in the anterior wall and septum. Wall Motion: Global hypokinesia is present. The left ventricle is not enlarged. Left Ventricular Ejection Fraction: 23 % End diastolic volume 726 ml End systolic volume 81 ml IMPRESSION: 1. Reversible ischemia within the anterior wall and septum. The fixed infarct is present along the base. 2. Global hypokinesia is present. 3. Left ventricular ejection fraction 23% 4. Non invasive risk stratification*: High risk *2012 Appropriate Use Criteria for Coronary Revascularization Focused Update: J Am Coll Cardiol. 2035;59(7):416-384. http://content.airportbarriers.com.aspx?articleid=[email or web address] Electronically Signed   By: San Morelle M.D.   On: 10/11/2016 12:10   Dg Chest Port 1 View  Result Date: 10/07/2016 CLINICAL DATA:  Acute onset of aphasia. Loss of consciousness. Chronic lethargy. Initial  encounter. EXAM: PORTABLE CHEST 1 VIEW COMPARISON:  Chest radiograph performed 04/18/2016 FINDINGS: The lungs are well-aerated. There is an 8 mm nodular density at the right midlung zone. This may be within the overlying scapula or rib, though no definite similar density was identified on the prior study. There is no evidence of pleural effusion or pneumothorax. The cardiomediastinal silhouette is within normal limits. No acute osseous abnormalities are seen. IMPRESSION: 1. No acute cardiopulmonary process seen. 2. Question of 8 mm nodule at the right midlung zone. CT of the chest would be helpful for further evaluation, when and as deemed clinically appropriate. Electronically Signed   By: Garald Balding M.D.   On: 10/07/2016 17:14     Assessment/Plan 1. Orthostatic hypotension Although I am not sure the Cymbalta is associated with his Bp problems, it does not seem to be helping anything else. I discussed with pt's son as will stop the Cymbalta.  2. Parkinson's disease ([place]) The current medical regimen is effective;  continue present plan and medications.  3. Normocytic anemia stable  4. Edema, unspecified type Stable to improved.  5. Congestive heart failure, unspecified congestive heart failure chronicity, unspecified congestive heart failure type ([place]) No signs of increasing heart failure, but the BNP is rising a little  6. Secondary adrenal insufficiency ([place]) Has appt with endocrinologist soon. Son is asking if Florinef should be started again.  7. Weight loss Has lost aq couple more pounds.

## 2016-10-31 ENCOUNTER — Encounter: Payer: Self-pay | Admitting: Endocrinology

## 2016-10-31 ENCOUNTER — Ambulatory Visit (INDEPENDENT_AMBULATORY_CARE_PROVIDER_SITE_OTHER): Payer: Medicare Other | Admitting: Endocrinology

## 2016-10-31 VITALS — BP 108/62 | HR 83 | Ht 70.0 in

## 2016-10-31 DIAGNOSIS — I10 Essential (primary) hypertension: Secondary | ICD-10-CM | POA: Diagnosis not present

## 2016-10-31 DIAGNOSIS — E2749 Other adrenocortical insufficiency: Secondary | ICD-10-CM

## 2016-10-31 DIAGNOSIS — I5042 Chronic combined systolic (congestive) and diastolic (congestive) heart failure: Secondary | ICD-10-CM | POA: Diagnosis not present

## 2016-10-31 DIAGNOSIS — G903 Multi-system degeneration of the autonomic nervous system: Secondary | ICD-10-CM | POA: Diagnosis not present

## 2016-10-31 NOTE — Progress Notes (Signed)
Patient ID: Lawrence Turner, male   DOB: May 04, 1926, 83 y.o.   MRN: [id number]              Chief complaint: Low blood pressure and Adrenal insufficiency   History of Present Illness:   Background information: The patient was diagnosed to have adrenal insufficiency when he was admitted for syncopal episodes on 04/18/16 Because of his orthostatic low blood pressures he was evaluated in the hospital Apparently also for about a year he had been losing weight He also had decreased appetite and generalized weakness Patient was evaluated with initial cortisol level of 1.2 Subsequently Cortrosyn stimulation showed baseline of 1.0 and peak level of 5.8 ACTH  level was 1.3 Because of his continued orthostatic hypotension he was started on Florinef in addition to his hydrocortisone on the initial admission  RECENT history:  Apparently at a recent hospitalization his blood pressure was relatively low and he had an episode of syncope His Florinef was stopped in the hospital because of a high BNP  level and mildly depressed ejection fraction However appears that he did not have any overt heart failure on his chest x-ray  He was started on midodrine which he is taking 5 mg in the morning after breakfast and about 2 PM  Apparently the nursing home is finding that his blood pressure sitting has been running fairly consistently high but fluctuating between 671 and 245 systolic and 80-998 diastolic Because of his syncope he is not being allowed to stand up He is also supposed to be wearing TED hose  Otherwise feels fairly good and his appetite is improved partly from taking Megace.  HYPOKALEMIA: He has had normal levels Continues to take potassium supplements   Lab Results  Component Value Date   CREATININE 1.10 10/22/2016   BUN 29 (H) 10/22/2016   NA 139 10/22/2016   K 4.1 10/22/2016   CL 105 10/22/2016   CO2 25 10/12/2016   His hospital records were reviewed in detail today.  Also during his  hospitalization, medication including telephone interaction was done with the hospital physicians and the cardiologist   Past Medical History:  Diagnosis Date  . Abnormality of gait 10/19/2015  . Anemia, unspecified   . Benign neoplasm of colon    pre-cancerous polyps removed at colonoscopy 2008  . Contact dermatitis and other eczema, due to unspecified cause   . Diverticulosis of colon (without mention of hemorrhage)   . Esophageal reflux   . Hypertrophy of prostate without urinary obstruction and other lower urinary tract symptoms (LUTS)   . Mononeuritis of unspecified site   . Orthostatic hypotension 04/18/2016  . Osteoarthrosis, unspecified whether generalized or localized, pelvic region and thigh   . Other B-complex deficiencies   . Other pulmonary embolism and infarction    following cholecystectomy 2003  . Parkinson disease ([place]) 10/19/2015  . Peripheral neuropathy ([place])   . Pure hypercholesterolemia   . Rosacea   . Seizures ([place]) 04/17/2016  . 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 Syncope and collapse 04/18/2016  . Ulcer of foot ([place]) 04/03/2016  . Unspecified constipation   . Unspecified essential hypertension   . Unspecified gastritis and gastroduodenitis without mention of hemorrhage 08/29/2006  . Unspecified hemorrhoids without mention of complication   . Unspecified vitamin D deficiency     Past Surgical History:  Procedure Laterality Date  . CATARACT EXTRACTION W/ INTRAOCULAR LENS IMPLANT Left 5/20//2013  . CHOLECYSTECTOMY, LAPAROSCOPIC  2003  . colonic polyp  removal  1965  . COLONOSCOPY  08/29/2006   hemorrhoids, 3 small polyps (hyperplastic) & diverticulosis  . HEMORRHOID SURGERY  1953    Family History  Problem Relation Age of Onset  . Cancer Mother     breast  . Pneumonia Father   . Cancer Brother     Social History:  reports that he quit smoking about 53 years ago. His smoking use included Cigarettes. He quit after 15.00 years  of use. He has never used smokeless tobacco. He reports that he drinks about 1.2 oz of alcohol per week . He reports that he does not use drugs.  Allergies:  Allergies  Allergen Reactions  . Lyrica [Pregabalin] Other (See Comments)    *Anticonvulsants*, fatique    Allergies as of 10/31/2016      Reactions   Lyrica [pregabalin] Other (See Comments)   *Anticonvulsants*, fatique      Medication List       Accurate as of 10/31/16 10:59 AM. Always use your most recent med list.          acetaminophen 325 MG tablet Commonly known as:  TYLENOL Take 325 mg by mouth every 4 (four) hours as needed for mild pain. Take 2 tablets daily; take 2 every 4 hours as needed for pain   BOOST PLUS PO Take 1 Can by mouth. One can two times a day with meals   carbidopa-levodopa 50-200 MG tablet Commonly known as:  SINEMET CR Take 1 tablet by mouth 3 (three) times daily.   cyclobenzaprine 5 MG tablet Commonly known as:  FLEXERIL Take 1 tablet (5 mg total) by mouth 3 (three) times daily as needed for muscle spasms (muscle pain and stiffness).   DULoxetine 30 MG capsule Commonly known as:  CYMBALTA Take 30 mg by mouth daily.   famotidine 20 MG tablet Commonly known as:  PEPCID Take 20 mg by mouth daily.   feeding supplement (PRO-STAT SUGAR FREE 64) Liqd Take 30 mLs by mouth 3 (three) times daily with meals.   hydrocortisone 10 MG tablet Commonly known as:  CORTEF Take 1 tablet (10 mg total) by mouth at bedtime.   ibuprofen 400 MG tablet Commonly known as:  ADVIL,MOTRIN Take 1 tablet (400 mg total) by mouth every 6 (six) hours as needed for moderate pain.   lactulose 10 GM/15ML solution Commonly known as:  CHRONULAC Take 40 g by mouth daily as needed for mild constipation. Take 60 ml as needed for constipation   magnesium oxide 400 (241.3 Mg) MG tablet Commonly known as:  MAG-OX Take 0.5 tablets (200 mg total) by mouth daily.   megestrol 400 MG/10ML suspension Commonly known as:   MEGACE Take 200 mg by mouth daily.   midodrine 5 MG tablet Commonly known as:  PROAMATINE Take 1 tablet (5 mg total) by mouth 2 (two) times daily with a meal. Take when you get up (8AMish) and again at 1 PM   polyethylene glycol packet Commonly known as:  MIRALAX / GLYCOLAX Take 17 g by mouth daily as needed for mild constipation.   potassium chloride SA 20 MEQ tablet Commonly known as:  K-DUR,KLOR-CON Take 20 mEq by mouth. Take one tablet daily for potassium   psyllium 95 % Pack Commonly known as:  HYDROCIL/METAMUCIL Take 1 packet by mouth daily.   THERA-M Tabs TAKE ONE TABLET BY MOUTH EVERY DAY (FORMULARY SUB. FOR DAILY VITE)   Vitamin B12-Folic Acid 387-564 MCG Tabs Take by mouth. Take two tablets once a day  zinc oxide 20 % ointment Apply 1 application topically 2 (two) times daily as needed for irritation.       LABS:  No visits with results within 1 Week(s) from this visit.  Latest known visit with results is:  Erroneous Encounter on 10/23/2016  Component Date Value Ref Range Status  . Hemoglobin 10/22/2016 10.7* 13.5 - 17.5 g/dL Final  . HCT 10/22/2016 32* 41 - 53 % Final  . Platelets 10/22/2016 173  150 - 399 K/L Final  . WBC 10/22/2016 11.5  10^3/mL Final  . Glucose 10/22/2016 115  mg/dL Final  . BUN 10/22/2016 26* 4 - 21 mg/dL Final  . Creatinine 10/22/2016 0.9  0.6 - 1.3 mg/dL Final  . Potassium 10/22/2016 3.9  3.4 - 5.3 mmol/L Final  . Sodium 10/22/2016 138  137 - 147 mmol/L Final          Review of Systems  Decreased appetite, currently taking Megace with improvement He is apparently eating 4 meals a day  He feels that he has been feeling more sleepy recently, no history of thyroid disease  Lab Results  Component Value Date   TSH 0.918 10/07/2016   TSH 2.297 04/24/2016   TSH 2.286 04/19/2016   FREET4 1.33 (H) 04/24/2016     PHYSICAL EXAM:  BP 108/62   Pulse 83   Ht 5\' 10"  (1.778 m)   SpO2 98%   REPEAT blood pressure sitting was  130/60 Standing blood pressure was 80/50 left arm  He is alert and communicative No pedal edema      ASSESSMENT:    Secondary adrenal insufficiency.  This appears to be adequately replaced with 20 mg a.m. and 10 mg in p.m.  He is clinically doing well with fairly good appetite, no nausea or weakness.  His nursing home is giving him the second dose of hydrocortisone at 8 PM  Orthostatic hypotension has been pronounced recently and is obvious today also on exam.  This is probably likely to be  mostly from autonomic neuropathy associated with Parkinson's disease.   Blood pressure is fluctuating markedly at times and even though his blood pressure this morning was recorded as 174/92 at the nursing home it is much lower in the office today Readings may be unreliable at the nursing home     PLAN:   He is going to be seen by his cardiologist this afternoon and will wait for his recommendations also.  If there is no contraindication he can take Florinef 3 times a week as this was helping before and his orthostasis is worse with stopping this.  Also may consider increasing the dose of his midodrine to 10 mg, would also benefit from taking the first tablet early at 6 AM instead of 9 AM.   However not sure if the blood pressure can be followed reliably at the nursing home Consider using an abdominal binder to improve orthostatic blood pressure  Will also recheck his free T4 level on the next visit, not clear if he has any evidence of secondary hypothyroidism, this was previously normal  Follow-up in 4 weeks   Total visit time including evaluation management, review of records and come indication = 25 minutes  Marcos Ruelas 10/31/2016, 10:59 AM

## 2016-11-05 ENCOUNTER — Telehealth: Payer: Self-pay | Admitting: Neurology

## 2016-11-05 ENCOUNTER — Telehealth: Payer: Self-pay | Admitting: *Deleted

## 2016-11-05 ENCOUNTER — Encounter: Payer: Self-pay | Admitting: Neurology

## 2016-11-05 ENCOUNTER — Non-Acute Institutional Stay (SKILLED_NURSING_FACILITY): Payer: Medicare Other | Admitting: Internal Medicine

## 2016-11-05 ENCOUNTER — Encounter: Payer: Self-pay | Admitting: Internal Medicine

## 2016-11-05 DIAGNOSIS — I509 Heart failure, unspecified: Secondary | ICD-10-CM

## 2016-11-05 DIAGNOSIS — R410 Disorientation, unspecified: Secondary | ICD-10-CM | POA: Diagnosis not present

## 2016-11-05 DIAGNOSIS — G20 Parkinson's disease: Secondary | ICD-10-CM

## 2016-11-05 DIAGNOSIS — R609 Edema, unspecified: Secondary | ICD-10-CM | POA: Diagnosis not present

## 2016-11-05 DIAGNOSIS — I951 Orthostatic hypotension: Secondary | ICD-10-CM

## 2016-11-05 MED ORDER — FLUDROCORTISONE ACETATE 0.1 MG PO TABS: ORAL_TABLET | ORAL | 5 refills | Status: DC

## 2016-11-05 NOTE — Progress Notes (Signed)
Progress Note    Location:  [facility] Room Number: N36 Place of Service:  SNF [phone]) Provider:  Jeanmarie Hubert, MD  Patient Care Team: Estill Dooms, MD as PCP - General (Internal Medicine) Man Otho Darner, NP as Nurse Practitioner (Nurse Practitioner) Kathrynn Ducking, MD as Consulting Physician (Neurology)  Extended Emergency Contact Information Primary Emergency Contact: Lucita Ferrara Address: [address]           [place], [place] [address] [name] of [place] Phone: [phone] Relation: Spouse Secondary Emergency Contact: Yohannes,Clifford  [place] Mobile Phone: [phone] Relation: Son  Code Status:  DNR Goals of care: Advanced Directive information Advanced Directives 11/05/2016  Does Patient Have a Medical Advance Directive? Yes  Type of Paramedic of [place];Living will;Out of facility DNR (pink MOST or yellow form)  Does patient want to make changes to medical advance directive? -  Copy of [place] in Chart? Yes  Pre-existing out of facility DNR order (yellow form or pink MOST form) Yellow form placed in chart (order not valid for inpatient use)     Chief Complaint  Patient presents with  . Acute Visit    confustion, fell 11/04/16    HPI:  Pt is a 83 y.o. male seen today for an acute visit for evaluation of confusion. He likely has fluctuating delirium. He is confused and to time and is making up stories. Not particularly agitated. Seen today while eating lunch. Appetite fairly good. No confusion at this time.  Continues to have issues with low BP, despite Midodrine.  He was taken off Cymbalta last week, but I do not think this is the cause of his confusion.   Florinef was stopped during his last hospitalization. The issue of whether to start it again is complicated by his CHF and a recent rise in the BNP. Peripheral fluid retention is improved and his lungs sound  clear.    Past Medical History:  Diagnosis Date  . Abnormality of gait 10/19/2015  . Anemia, unspecified   . Benign neoplasm of colon    pre-cancerous polyps removed at colonoscopy 2008  . Contact dermatitis and other eczema, due to unspecified cause   . Diverticulosis of colon (without mention of hemorrhage)   . Esophageal reflux   . Hypertrophy of prostate without urinary obstruction and other lower urinary tract symptoms (LUTS)   . Mononeuritis of unspecified site   . Orthostatic hypotension 04/18/2016  . Osteoarthrosis, unspecified whether generalized or localized, pelvic region and thigh   . Other B-complex deficiencies   . Other pulmonary embolism and infarction    following cholecystectomy 2003  . Parkinson disease ([place]) 10/19/2015  . Peripheral neuropathy ([place])   . Pure hypercholesterolemia   . Rosacea   . Seizures ([place]) 04/17/2016  . 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 Syncope and collapse 04/18/2016  . Ulcer of foot ([place]) 04/03/2016  . Unspecified constipation   . Unspecified essential hypertension   . Unspecified gastritis and gastroduodenitis without mention of hemorrhage 08/29/2006  . Unspecified hemorrhoids without mention of complication   . Unspecified vitamin D deficiency    Past Surgical History:  Procedure Laterality Date  . CATARACT EXTRACTION W/ INTRAOCULAR LENS IMPLANT Left 5/20//2013  . CHOLECYSTECTOMY, LAPAROSCOPIC  2003  . colonic polyp removal  1965  . COLONOSCOPY  08/29/2006   hemorrhoids, 3 small polyps (hyperplastic) & diverticulosis  . HEMORRHOID SURGERY  1953    Allergies  Allergen Reactions  . Lyrica [Pregabalin] Other (See Comments)    *Anticonvulsants*, fatique    Allergies as of 11/05/2016      Reactions   Lyrica [pregabalin] Other (See Comments)   *Anticonvulsants*, fatique      Medication List       Accurate as of 11/05/16  1:21 PM. Always use your most recent med list.          acetaminophen 325 MG  tablet Commonly known as:  TYLENOL Take 325 mg by mouth every 4 (four) hours as needed for mild pain. Take 2 tablets daily; take 2 every 4 hours as needed for pain   BOOST PLUS PO Take 1 Can by mouth. One can two times a day with meals   carbidopa-levodopa 50-200 MG tablet Commonly known as:  SINEMET CR Take 1 tablet by mouth 3 (three) times daily.   cyclobenzaprine 5 MG tablet Commonly known as:  FLEXERIL Take 1 tablet (5 mg total) by mouth 3 (three) times daily as needed for muscle spasms (muscle pain and stiffness).   famotidine 20 MG tablet Commonly known as:  PEPCID Take 20 mg by mouth daily.   feeding supplement (PRO-STAT SUGAR FREE 64) Liqd Take 30 mLs by mouth 3 (three) times daily with meals.   hydrocortisone 20 MG tablet Commonly known as:  CORTEF Take 20 mg by mouth daily with breakfast.   hydrocortisone 10 MG tablet Commonly known as:  CORTEF Take 1 tablet (10 mg total) by mouth at bedtime.   ibuprofen 400 MG tablet Commonly known as:  ADVIL,MOTRIN Take 1 tablet (400 mg total) by mouth every 6 (six) hours as needed for moderate pain.   magnesium oxide 400 (241.3 Mg) MG tablet Commonly known as:  MAG-OX Take 0.5 tablets (200 mg total) by mouth daily.   megestrol 400 MG/10ML suspension Commonly known as:  MEGACE Take 200 mg by mouth daily.   midodrine 5 MG tablet Commonly known as:  PROAMATINE Take 1 tablet (5 mg total) by mouth 2 (two) times daily with a meal. Take when you get up (8AMish) and again at 1 PM   polyethylene glycol packet Commonly known as:  MIRALAX / GLYCOLAX Take 17 g by mouth daily as needed for mild constipation.   potassium chloride SA 20 MEQ tablet Commonly known as:  K-DUR,KLOR-CON Take 20 mEq by mouth. Take one tablet daily for potassium   psyllium 95 % Pack Commonly known as:  HYDROCIL/METAMUCIL Take 1 packet by mouth daily.   THERA-M Tabs TAKE ONE TABLET BY MOUTH EVERY DAY (FORMULARY SUB. FOR DAILY VITE)   Vitamin  B12-Folic Acid 961-164 MCG Tabs Take by mouth. Take two tablets once a day   zinc oxide 20 % ointment Apply 1 application topically 2 (two) times daily as needed for irritation.       Review of Systems  Constitutional: Positive for activity change and fatigue. Negative for chills, diaphoresis and fever.  HENT: Negative for congestion, ear pain and hearing loss.   Eyes: Negative for photophobia, pain, discharge and redness.  Respiratory: Positive for shortness of breath. Negative for cough and wheezing.   Cardiovascular: Positive for leg swelling (both lower legs ). Negative for chest pain and palpitations.       Postural drops in BP. Very high BP at times. Hx CHF  Gastrointestinal: Positive for constipation. Negative for abdominal pain, blood in stool, diarrhea and nausea.  Endocrine:       B12  deficiency on oral supplements  Genitourinary: Negative.   Musculoskeletal: Positive for arthralgias. Negative for back pain, myalgias and neck pain.  Skin:       Multiple seborrheic keratoses.   Neurological: Positive for tremors and weakness. Negative for seizures and headaches.       Complaint of loss of motor skills and changes in. He is unstable with walking. He has a known peripheral neuropathy of uncertain origin. Micrographia. Pressured, soft speech that is nearly inaudible. History of Parkinsonism currently treated with Sinemet.  Hematological:       History of anemia  Psychiatric/Behavioral: Positive for confusion and decreased concentration. The patient is nervous/anxious.        Staff and son report confabulation and confusion that fluctuates.    Immunization History  Administered Date(s) Administered  . DTaP 09/01/2007  . Influenza-Unspecified 07/04/2014, 05/19/2015, 06/01/2016  . Pneumococcal Polysaccharide-23 06/11/2002   Pertinent  Health Maintenance Due  Topic Date Due  . PNA vac Low Risk Adult (2 of 2 - PCV13) 06/12/2003  . INFLUENZA VACCINE  Completed   Fall  Risk  04/03/2016 02/14/2016 01/24/2016 01/24/2016 10/25/2015  Falls in the past year? No No Yes No No  Number falls in past yr: - - 2 or more - -  Injury with Fall? - - No - -  Risk Factor Category  - - - - -  Risk for fall due to : - - - - -  Risk for fall due to (comments): - - - - -  Follow up - - - - -   Functional Status Survey:    Vitals:   11/05/16 1311  BP: (!) 168/86  Pulse: 90  Resp: 18  Temp: 98.9 F (37.2 C)  SpO2: 98%  Weight: 142 lb (64.4 kg)  Height: 5\' 10"  (1.778 m)   Body mass index is 20.37 kg/m. Physical Exam  Constitutional: He is oriented to person, place, and time.  Thin. Elderly. Chronically weaka nd tired.  HENT:  Right Ear: External ear normal.  Left Ear: External ear normal.  Nose: Nose normal.  Mouth/Throat: Oropharynx is clear and moist. No oropharyngeal exudate.  Eyes: Conjunctivae are normal. Pupils are equal, round, and reactive to light.  Neck: No JVD present. No tracheal deviation present. No thyromegaly present.  Cardiovascular: Normal rate, regular rhythm, normal heart sounds and intact distal pulses.  Exam reveals no gallop and no friction rub.   No murmur heard. Pulmonary/Chest: No respiratory distress. He has no wheezes. He has no rales. He exhibits no tenderness.  Abdominal: He exhibits no distension and no mass. There is no tenderness.  Musculoskeletal: Normal range of motion. He exhibits edema (1+ ipedal) and tenderness.  unstable gait. Trace edema BLE. Pain with movement of the left hip and thigh  Lymphadenopathy:    He has no cervical adenopathy.  Neurological: He is alert and oriented to person, place, and time. He displays abnormal reflex (Absent at both knees). No cranial nerve deficit. Coordination normal.  Cogwheeling most evident in the right wrist. Gait is mildly abnormal with a suggestion of a festinating gait. Speech is soft and mildly pressured. There is a mild tremor at rest. There is no focal weakness. 1/14//15 MMSE 30/30.  Passed clock drawing test.  Skin: No rash noted. No erythema. No pallor.  Healed corn at the plantar aspect of the the left 5th MTJ  Psychiatric:  Patient appears mildly anxious.     Labs reviewed:  Recent Labs  04/19/16 0434  10/08/16  1200 10/09/16 0536 10/10/16 0519 10/12/16 0441 10/22/16 10/22/16 1628  NA 141  < >  --  142 141 134* 138 139  K 4.1  < >  --  3.4* 3.8 3.3* 3.9 4.1  CL 111  < >  --  110 109 103  --  105  CO2 22  < >  --  24 25 25   --   --   GLUCOSE 96  < >  --  120* 111* 105*  --  126*  BUN 26*  < >  --  31* 29* 22* 26* 29*  CREATININE 1.16  < >  --  1.04 1.07 0.96 0.9 1.10  CALCIUM 9.0  < >  --  8.5* 8.3* 8.1*  --   --   MG 2.1  --  1.9  --   --   --   --   --   < > = values in this interval not displayed.  Recent Labs  05/08/16 1653  05/24/16 1500 08/23/16 10/07/16 1454  AST 27  < > 29 14 30   ALT 30  < > 10 6* 23  ALKPHOS 39  < > 39 26 37*  BILITOT 0.3  --  0.5  --  1.2  PROT 5.2*  --  5.6*  --  5.1*  ALBUMIN 3.2*  --  3.0*  --  2.9*  < > = values in this interval not displayed.  Recent Labs  04/23/16 0357 04/24/16 0654  10/10/16 0519 10/12/16 0441 10/22/16 10/22/16 1616 10/22/16 1628  WBC 9.3 10.0  < > 11.3* 14.1* 11.5 11.9*  --   NEUTROABS 7.2 8.0*  --   --   --   --  10.2*  --   HGB 10.4* 10.7*  < > 10.0* 11.3* 10.7* 10.4* 9.2*  HCT 31.0* 32.6*  < > 30.9* 34.4* 32* 32.4* 27.0*  MCV 94.2 94.8  < > 98.7 97.5  --  100.6*  --   PLT 164 149*  < > 156 170 173 146*  --   < > = values in this interval not displayed. Lab Results  Component Value Date   TSH 0.918 10/07/2016   No results found for: HGBA1C Lab Results  Component Value Date   CHOL 166 05/24/2014   HDL 45 05/24/2014   LDLCALC 110 05/24/2014   TRIG 54 05/24/2014    Significant Diagnostic Results in last 30 days:  Dg Chest 2 View  Result Date: 10/22/2016 CLINICAL DATA:  Shortness of breath. EXAM: CHEST  2 VIEW COMPARISON:  October 07, 2016 FINDINGS: Stable cardiomegaly. The  hila, mediastinum, lungs, and pleura are otherwise unremarkable. IMPRESSION: No active cardiopulmonary disease. Electronically Signed   By: Dorise Bullion III M.D   On: 10/22/2016 16:48   Ct Head Wo Contrast  Result Date: 10/07/2016 CLINICAL DATA:  Aphasia EXAM: CT HEAD WITHOUT CONTRAST TECHNIQUE: Contiguous axial images were obtained from the base of the skull through the vertex without intravenous contrast. COMPARISON:  None. FINDINGS: Brain: There is no evidence for acute hemorrhage, hydrocephalus, mass lesion, or abnormal extra-axial fluid collection. No definite CT evidence for acute infarction. Diffuse loss of parenchymal volume is consistent with atrophy. Patchy low attenuation in the deep hemispheric and periventricular white matter is nonspecific, but likely reflects chronic microvascular ischemic demyelination. Vascular: No hyperdense vessel or unexpected calcification. Skull: No evidence for fracture. No worrisome lytic or sclerotic lesion. Sinuses/Orbits: The visualized paranasal sinuses and mastoid air cells are clear. Visualized portions  of the globes and intraorbital fat are unremarkable. Other: None. IMPRESSION: 1. No acute intracranial abnormality. 2. Atrophy with chronic small vessel white matter ischemic disease. Electronically Signed   By: Misty Stanley M.D.   On: 10/07/2016 16:57   Ct Chest Wo Contrast  Result Date: 10/08/2016 CLINICAL DATA:  Shortness of breath. Lung nodule. Abnormal chest x-ray. EXAM: CT CHEST WITHOUT CONTRAST TECHNIQUE: Multidetector CT imaging of the chest was performed following the standard protocol without IV contrast. COMPARISON:  None. FINDINGS: Cardiovascular: The heart size is normal. Blood pool is hypodense, compatible with known anemia. Coronary artery calcifications are present. The aorta and pulmonary arteries are within normal limits. Mediastinum/Nodes: No significant mediastinal or axillary adenopathy is present. The thoracic inlet is normal. The  esophagus is dilated throughout its course in the chest. Lungs/Pleura: Centrilobular emphysema is present. There is no focal lung nodule to correspond to the finding on the chest x-ray. Finding corresponds with overlap of a posterior right rib and the scapula. Linear scarring or atelectasis is present at the left base. No significant pleural effusion or pneumothorax is present. Upper Abdomen: Cholecystectomy is noted. Limited imaging the upper abdomen demonstrates moderate distention of the stomach. No other focal lesions are present. Musculoskeletal: Fusion of anterior osteophytes throughout the thoracic spine is compatible with DISH. Vertebral body heights are maintained. No focal lytic or blastic lesions are evident. IMPRESSION: 1. No pulmonary nodule.  The finding was likely artifactual. 2. Mild emphysema. 3. Mild atherosclerotic cysts including coronary artery disease. 4. Dilation of the esophagus throughout the mediastinum suggesting esophageal dysmotility. No mass lesion is present. 5. Fusion of anterior osteophytes compatible with DISH. Electronically Signed   By: San Morelle M.D.   On: 10/08/2016 20:35   Nm Myocar Multi W/spect W/wall Motion / Ef  Result Date: 10/11/2016 CLINICAL DATA:  Congestive heart failure.  Hypertension. EXAM: MYOCARDIAL IMAGING WITH SPECT (REST AND EXERCISE) GATED LEFT VENTRICULAR WALL MOTION STUDY LEFT VENTRICULAR EJECTION FRACTION TECHNIQUE: Standard myocardial SPECT imaging was performed after resting intravenous injection of 10 mCi Tc-7m tetrofosmin. Subsequently, exercise tolerance test was performed by the patient under the supervision of the Cardiology staff. At peak-stress, 30 mCi Tc-43m tetrofosmin was injected intravenously and standard myocardial SPECT imaging was performed. Quantitative gated imaging was also performed to evaluate left ventricular wall motion, and estimate left ventricular ejection fraction. COMPARISON:  CT the chest 10/08/2016. FINDINGS:  Perfusion: There is a fixed defect at the base PA reversible ischemia is noted in the anterior wall and septum. Wall Motion: Global hypokinesia is present. The left ventricle is not enlarged. Left Ventricular Ejection Fraction: 23 % End diastolic volume 277 ml End systolic volume 81 ml IMPRESSION: 1. Reversible ischemia within the anterior wall and septum. The fixed infarct is present along the base. 2. Global hypokinesia is present. 3. Left ventricular ejection fraction 23% 4. Non invasive risk stratification*: High risk *2012 Appropriate Use Criteria for Coronary Revascularization Focused Update: J Am Coll Cardiol. 8242;35(3):614-431. http://content.airportbarriers.com.aspx?articleid=[email or web address] Electronically Signed   By: San Morelle M.D.   On: 10/11/2016 12:10   Dg Chest Port 1 View  Result Date: 10/07/2016 CLINICAL DATA:  Acute onset of aphasia. Loss of consciousness. Chronic lethargy. Initial encounter. EXAM: PORTABLE CHEST 1 VIEW COMPARISON:  Chest radiograph performed 04/18/2016 FINDINGS: The lungs are well-aerated. There is an 8 mm nodular density at the right midlung zone. This may be within the overlying scapula or rib, though no definite similar density was identified on the prior study. There is no  evidence of pleural effusion or pneumothorax. The cardiomediastinal silhouette is within normal limits. No acute osseous abnormalities are seen. IMPRESSION: 1. No acute cardiopulmonary process seen. 2. Question of 8 mm nodule at the right midlung zone. CT of the chest would be helpful for further evaluation, when and as deemed clinically appropriate. Electronically Signed   By: Garald Balding M.D.   On: 10/07/2016 17:14    Assessment/Plan 1. Delirium No medication added for this  2. Congestive heart failure, unspecified congestive heart failure chronicity, unspecified congestive heart failure type ([place]) stable  3. Edema, unspecified type improved  4. Parkinson's disease  ([place]) unchanged  5. Orthostatic hypotension -continue midodrine. - fludrocortisone (FLORINEF) 0.1 MG tablet; One each morning to help support BP  Dispense: 30 tablet; Refill: 5

## 2016-11-05 NOTE — Telephone Encounter (Signed)
The patient has had some increased confusion recently, waking up in the night and calling about trying to go to a meeting. The patient sleeps a lot during the day, the will fall out of bed at night frequently. He may have a REM sleep behavior disorder.  The patient is on Flexeril, this is a when necessary medication, he is getting it regularly, this should be stopped. We may consider the use of clonazepam in low dose in the future.

## 2016-11-05 NOTE — Telephone Encounter (Signed)
I saw Lawrence Turner today. He was OK while eating lunch. I do not think the discontinuation of Cymbalta is likely to have had anything to do with his confusion.

## 2016-11-05 NOTE — Telephone Encounter (Signed)
Patient son, [name], called and stated that patient is at [facility]. Just gotten out of hospital and states that patient is getting "Senior Delirium" due to being in strange places. He is confused about time, saying things like he is not getting his meals and called him late one night stating he had to get to a meeting and no one was coming to get him. Son states everything else he said made perfect sense except that he had to get to a meeting. Knows Delirium is serious. Patient was just recently taken off of Cymbalta and has Parkinsonism and wonders if either would contribute.  Son wants to know if you will go by and see him at [facility]  and make sure he is ok.

## 2016-11-06 ENCOUNTER — Encounter: Payer: Self-pay | Admitting: Internal Medicine

## 2016-11-06 NOTE — Telephone Encounter (Signed)
[name] stopped by clinic, would like Dr. Nyoka Cowden to talk with Dr. Einar Gip about the Florinef stopping it. Also, concern that his dad is getting more depressed and needs to be on something. The therapist recommended something like Prozac or Paxil.

## 2016-11-06 NOTE — Telephone Encounter (Signed)
Son, [name] would like to speak with you regarding the visit yesterday and patient's medications. Would like for you to call him at [phone]

## 2016-11-06 NOTE — Telephone Encounter (Signed)
Discussed issues by phone with son. Will stop cyclobenzaprine, add escitalopram, and discuss Florinef with Dr. Einar Gip

## 2016-11-12 ENCOUNTER — Encounter: Payer: Self-pay | Admitting: Internal Medicine

## 2016-11-12 ENCOUNTER — Encounter: Payer: Self-pay | Admitting: Nurse Practitioner

## 2016-11-12 LAB — HEPATIC FUNCTION PANEL
ALT: 18 U/L (ref 10–40)
AST: 18 U/L (ref 14–40)
Alkaline Phosphatase: 35 U/L (ref 25–125)
Bilirubin, Total: 0.5 mg/dL

## 2016-11-12 LAB — BASIC METABOLIC PANEL
BUN: 39 mg/dL — AB (ref 4–21)
Creatinine: 1.1 mg/dL (ref <=1.3)
GLUCOSE: 89 mg/dL
POTASSIUM: 3.9 mmol/L (ref 3.4–5.3)
Sodium: 141 mmol/L (ref 137–147)

## 2016-11-13 ENCOUNTER — Other Ambulatory Visit: Payer: Self-pay | Admitting: *Deleted

## 2016-11-15 ENCOUNTER — Telehealth: Payer: Self-pay

## 2016-11-15 ENCOUNTER — Encounter: Payer: Self-pay | Admitting: Internal Medicine

## 2016-11-15 NOTE — Telephone Encounter (Signed)
Lawrence Turner is calling to request a status update of the conversation between [place] and Dr.Ganji. [name] would like to know if patient was to continue Florinef

## 2016-11-20 ENCOUNTER — Encounter: Payer: Self-pay | Admitting: Neurology

## 2016-11-20 ENCOUNTER — Encounter: Payer: Self-pay | Admitting: Internal Medicine

## 2016-11-20 ENCOUNTER — Non-Acute Institutional Stay (SKILLED_NURSING_FACILITY): Payer: Medicare Other | Admitting: Internal Medicine

## 2016-11-20 DIAGNOSIS — I509 Heart failure, unspecified: Secondary | ICD-10-CM | POA: Diagnosis not present

## 2016-11-20 DIAGNOSIS — G20 Parkinson's disease: Secondary | ICD-10-CM

## 2016-11-20 DIAGNOSIS — R609 Edema, unspecified: Secondary | ICD-10-CM | POA: Diagnosis not present

## 2016-11-20 DIAGNOSIS — E2749 Other adrenocortical insufficiency: Secondary | ICD-10-CM | POA: Diagnosis not present

## 2016-11-20 DIAGNOSIS — I951 Orthostatic hypotension: Secondary | ICD-10-CM | POA: Diagnosis not present

## 2016-11-20 NOTE — Progress Notes (Signed)
Progress Note    Location:  [facility] Room Number: N36 Place of Service:  SNF [phone]) Provider: Dr. Jeanmarie Hubert  Patient Care Team: Estill Dooms, MD as PCP - General (Internal Medicine) Man Otho Darner, NP as Nurse Practitioner (Nurse Practitioner) Kathrynn Ducking, MD as Consulting Physician (Neurology)  Extended Emergency Contact Information Primary Emergency Contact: Lucita Ferrara Address: [address]           [place], [place] [address] [name] of [place] Phone: [phone] Relation: Spouse Secondary Emergency Contact: Rumble,Clifford  [place] Phone: [phone] Relation: Son  Code Status: DNR Goals of Care: Advanced Directive information Advanced Directives 11/20/2016  Does Patient Have a Medical Advance Directive? Yes  Type of Paramedic of [place];Living will  Does patient want to make changes to medical advance directive? -  Copy of [place] in Chart? Yes  Pre-existing out of facility DNR order (yellow form or pink MOST form) Yellow form placed in chart (order not valid for inpatient use)     Chief Complaint  Patient presents with  . Acute Visit    elevated BNP    HPI: Patient is a 83 y.o. male seen for evaluation of elevated BNP. Son asked [place] to reevaluate for CHF. He is tired and frequently dyspneic. BP continues to fluctuate. Peripheral edema present, but improved compared to prior visits.  Depression screen [facility] 2/9 04/03/2016 08/11/2015 11/16/2014 10/05/2014 02/02/2014  Decreased Interest 0 0 0 0 0  Down, Depressed, Hopeless 0 0 0 0 0  PHQ - 2 Score 0 0 0 0 0    Fall Risk  04/03/2016 02/14/2016 01/24/2016 01/24/2016 10/25/2015  Falls in the past year? No No Yes No No  Number falls in past yr: - - 2 or more - -  Injury with Fall? - - No - -  Risk Factor Category  - - - - -  Risk for fall due to : - - - - -  Risk for fall due to (comments): - - - - -  Follow  up - - - - -   No flowsheet data found.   Health Maintenance  Topic Date Due  . TETANUS/TDAP  09/26/1944  . PNA vac Low Risk Adult (2 of 2 - PCV13) 06/12/2003  . INFLUENZA VACCINE  03/20/2017    Urinary incontinence? yes Functional Status Survey:   Exercise? PT attempting to stand and exercise, but postural hypotension interferes.  Diet? Eating reasonablyt well No exam data present Hearing:  diminished Dentition: fair Pain:: none  Past Medical History:  Diagnosis Date  . Abnormality of gait 10/19/2015  . Anemia, unspecified   . Benign neoplasm of colon    pre-cancerous polyps removed at colonoscopy 2008  . Contact dermatitis and other eczema, due to unspecified cause   . Diverticulosis of colon (without mention of hemorrhage)   . Esophageal reflux   . Hypertrophy of prostate without urinary obstruction and other lower urinary tract symptoms (LUTS)   . Mononeuritis of unspecified site   . Orthostatic hypotension 04/18/2016  . Osteoarthrosis, unspecified whether generalized or localized, pelvic region and thigh   . Other B-complex deficiencies   . Other pulmonary embolism and infarction    following cholecystectomy 2003  . Parkinson disease ([place]) 10/19/2015  . Peripheral neuropathy ([place])   . Pure hypercholesterolemia   . Rosacea   . Seizures ([place]) 04/17/2016  . Spondylosis of unspecified site  without mention of myelopathy   . Syncope and collapse 04/18/2016  . Ulcer of foot ([place]) 04/03/2016  . Unspecified constipation   . Unspecified essential hypertension   . Unspecified gastritis and gastroduodenitis without mention of hemorrhage 08/29/2006  . Unspecified hemorrhoids without mention of complication   . Unspecified vitamin D deficiency     Past Surgical History:  Procedure Laterality Date  . CATARACT EXTRACTION W/ INTRAOCULAR LENS IMPLANT Left 5/20//2013  . CHOLECYSTECTOMY, LAPAROSCOPIC  2003  . colonic polyp removal  1965  . COLONOSCOPY  08/29/2006   hemorrhoids, 3  small polyps (hyperplastic) & diverticulosis  . HEMORRHOID SURGERY  1953    Family History  Problem Relation Age of Onset  . Cancer Mother     breast  . Pneumonia Father   . Cancer Brother     Social History   Social History  . Marital status: Married    Spouse name: [name]  . Number of children: 1  . Years of education: 68   Occupational History  . retired Advertising account executive    Social History Main Topics  . Smoking status: Former Smoker    Years: 15.00    Types: Cigarettes    Quit date: 01/30/1963  . Smokeless tobacco: Never Used  . Alcohol use 1.2 oz/week    1 Glasses of wine, 1 Cans of beer per week     Comment: 14 per week  . Drug use: No  . Sexual activity: Not Asked   Other Topics Concern  . None   Social History Narrative   L[facility] at [facility] since [facility]/20/2014, 10/19/15 currently in assisted living area   Married Edson   Former smoker stopped 1964   Alcohlol 14 drinks per week   Exercise walks daily   Living Will, POA             reports that he quit smoking about 53 years ago. His smoking use included Cigarettes. He quit after 15.00 years of use. He has never used smokeless tobacco. He reports that he drinks about 1.2 oz of alcohol per week . He reports that he does not use drugs.   Allergies  Allergen Reactions  . Lyrica [Pregabalin] Other (See Comments)    *Anticonvulsants*, fatique    Allergies as of 11/20/2016      Reactions   Lyrica [pregabalin] Other (See Comments)   *Anticonvulsants*, fatique      Medication List       Accurate as of 11/20/16  4:56 PM. Always use your most recent med list.          acetaminophen 325 MG tablet Commonly known as:  TYLENOL Take 325 mg by mouth every 4 (four) hours as needed for mild pain. Take 2 tablets daily; take 2 every 4 hours as needed for pain   BOOST PLUS PO Take 1 Can by mouth. One can two times a day with meals   carbidopa-levodopa 50-200 MG tablet Commonly known as:  SINEMET CR Take 1 tablet by  mouth 3 (three) times daily.   cyclobenzaprine 5 MG tablet Commonly known as:  FLEXERIL Take 1 tablet (5 mg total) by mouth 3 (three) times daily as needed for muscle spasms (muscle pain and stiffness).   famotidine 20 MG tablet Commonly known as:  PEPCID Take 20 mg by mouth daily.   feeding supplement (PRO-STAT SUGAR FREE 64) Liqd Take 30 mLs by mouth 3 (three) times daily with meals.   hydrocortisone 20 MG tablet Commonly known as:  CORTEF Take 20 mg by mouth daily with breakfast.   hydrocortisone 10 MG tablet Commonly known as:  CORTEF Take 1 tablet (10 mg total) by mouth at bedtime.   ibuprofen 400 MG tablet Commonly known as:  ADVIL,MOTRIN Take 1 tablet (400 mg total) by mouth every 6 (six) hours as needed for moderate pain.   lactulose 10 GM/15ML solution Commonly known as:  CHRONULAC Take by mouth. Give 40 grams/60 mls daily for constipation once a day as needed   magnesium oxide 400 (241.3 Mg) MG tablet Commonly known as:  MAG-OX Take 0.5 tablets (200 mg total) by mouth daily.   megestrol 400 MG/10ML suspension Commonly known as:  MEGACE Take 200 mg by mouth daily.   midodrine 10 MG tablet Commonly known as:  PROAMATINE Take 10 mg by mouth. Take one tablet twice a day   polyethylene glycol packet Commonly known as:  MIRALAX / GLYCOLAX Take 17 g by mouth daily as needed for mild constipation.   potassium chloride SA 20 MEQ tablet Commonly known as:  K-DUR,KLOR-CON Take 20 mEq by mouth. Take one tablet daily for potassium   psyllium 95 % Pack Commonly known as:  HYDROCIL/METAMUCIL Take 1 packet by mouth daily.   THERA-M Tabs TAKE ONE TABLET BY MOUTH EVERY DAY (FORMULARY SUB. FOR DAILY VITE)   Vitamin B12-Folic Acid 408-144 MCG Tabs Take by mouth. Take two tablets once a day   zinc oxide 20 % ointment Apply 1 application topically 2 (two) times daily as needed for irritation.        Review of Systems:  Review of Systems  Constitutional:  Positive for activity change and fatigue. Negative for chills, diaphoresis and fever.  HENT: Negative for congestion, ear pain and hearing loss.   Eyes: Negative for photophobia, pain, discharge and redness.  Respiratory: Positive for shortness of breath. Negative for cough and wheezing.   Cardiovascular: Positive for leg swelling (both lower legs ). Negative for chest pain and palpitations.       Postural drops in BP. Very high BP at times. Hx CHF  Gastrointestinal: Positive for constipation. Negative for abdominal pain, blood in stool, diarrhea and nausea.  Endocrine:       B12 deficiency on oral supplements  Genitourinary: Negative.   Musculoskeletal: Positive for arthralgias. Negative for back pain, myalgias and neck pain.  Skin:       Multiple seborrheic keratoses.   Neurological: Positive for tremors and weakness. Negative for seizures and headaches.       Complaint of loss of motor skills and changes in. He is unstable with walking. He has a known peripheral neuropathy of uncertain origin. Micrographia. Pressured, soft speech that is nearly inaudible. History of Parkinsonism currently treated with Sinemet.  Hematological:       History of anemia  Psychiatric/Behavioral: Positive for confusion and decreased concentration. The patient is nervous/anxious.        Staff and son report confabulation and confusion that fluctuates.    Physical Exam: Vitals:   11/20/16 1634  BP: (!) 144/75  Pulse: 77  Resp: 20  Temp: 98.5 F (36.9 C)  SpO2: 95%  Weight: 142 lb (64.4 kg)  Height: 5\' 10"  (1.778 m)   Body mass index is 20.37 kg/m. Physical Exam  Constitutional: He is oriented to person, place, and time.  Thin. Elderly. Chronically weaka nd tired.  HENT:  Right Ear: External ear normal.  Left Ear: External ear normal.  Nose: Nose normal.  Mouth/Throat: Oropharynx is clear  and moist. No oropharyngeal exudate.  Eyes: Conjunctivae are normal. Pupils are equal, round, and  reactive to light.  Neck: No JVD present. No tracheal deviation present. No thyromegaly present.  Cardiovascular: Normal rate, regular rhythm, normal heart sounds and intact distal pulses.  Exam reveals no gallop and no friction rub.   No murmur heard. Pulmonary/Chest: No respiratory distress. He has no wheezes. He has no rales. He exhibits no tenderness.  Abdominal: He exhibits no distension and no mass. There is no tenderness.  Musculoskeletal: Normal range of motion. He exhibits edema (1+ ipedal) and tenderness.  unstable gait. Trace edema BLE. Pain with movement of the left hip and thigh  Lymphadenopathy:    He has no cervical adenopathy.  Neurological: He is alert and oriented to person, place, and time. He displays abnormal reflex (Absent at both knees). No cranial nerve deficit. Coordination normal.  Cogwheeling most evident in the right wrist. Gait is mildly abnormal with a suggestion of a festinating gait. Speech is soft and mildly pressured. There is a mild tremor at rest. There is no focal weakness. 1/14//15 MMSE 30/30. Passed clock drawing test.  Skin: No rash noted. No erythema. No pallor.  Healed corn at the plantar aspect of the the left 5th MTJ  Psychiatric:  Patient appears mildly anxious.     Labs reviewed: Basic Metabolic Panel:  Recent Labs  04/19/16 0434  04/24/16 1517  10/07/16 2000  10/08/16 1200 10/09/16 0536 10/10/16 0519 10/12/16 0441 10/22/16 10/22/16 1628 11/12/16  NA 141  < >  --   < >  --   < >  --  142 141 134* 138 139 141  K 4.1  < >  --   < >  --   < >  --  3.4* 3.8 3.3* 3.9 4.1 3.9  CL 111  < >  --   < >  --   < >  --  110 109 103  --  105  --   CO2 22  < >  --   < >  --   < >  --  24 25 25   --   --   --   GLUCOSE 96  < >  --   < >  --   < >  --  120* 111* 105*  --  126*  --   BUN 26*  < >  --   < >  --   < >  --  31* 29* 22* 26* 29* 39*  CREATININE 1.16  < >  --   < >  --   < >  --  1.04 1.07 0.96 0.9 1.10 1.1  CALCIUM 9.0  < >  --   < >  --    < >  --  8.5* 8.3* 8.1*  --   --   --   MG 2.1  --   --   --   --   --  1.9  --   --   --   --   --   --   TSH 2.286  --  2.297  --  0.918  --   --   --   --   --   --   --   --   < > = values in this interval not displayed. Liver Function Tests:  Recent Labs  05/08/16 1653  05/24/16 1500 08/23/16 10/07/16 1454 11/12/16  AST 27  < > 29  14 30 18   ALT 30  < > 10 6* 23 18  ALKPHOS 39  < > 39 26 37* 35  BILITOT 0.3  --  0.5  --  1.2  --   PROT 5.2*  --  5.6*  --  5.1*  --   ALBUMIN 3.2*  --  3.0*  --  2.9*  --   < > = values in this interval not displayed. No results for input(s): LIPASE, AMYLASE in the last 8760 hours. No results for input(s): AMMONIA in the last 8760 hours. CBC:  Recent Labs  04/23/16 0357 04/24/16 0654  10/10/16 0519 10/12/16 0441 10/22/16 10/22/16 1616 10/22/16 1628  WBC 9.3 10.0  < > 11.3* 14.1* 11.5 11.9*  --   NEUTROABS 7.2 8.0*  --   --   --   --  10.2*  --   HGB 10.4* 10.7*  < > 10.0* 11.3* 10.7* 10.4* 9.2*  HCT 31.0* 32.6*  < > 30.9* 34.4* 32* 32.4* 27.0*  MCV 94.2 94.8  < > 98.7 97.5  --  100.6*  --   PLT 164 149*  < > 156 170 173 146*  --   < > = values in this interval not displayed. Lipid Panel: No results for input(s): CHOL, HDL, LDLCALC, TRIG, CHOLHDL, LDLDIRECT in the last 8760 hours. No results found for: HGBA1C  Procedures: Dg Chest 2 View  Result Date: 10/22/2016 CLINICAL DATA:  Shortness of breath. EXAM: CHEST  2 VIEW COMPARISON:  October 07, 2016 FINDINGS: Stable cardiomegaly. The hila, mediastinum, lungs, and pleura are otherwise unremarkable. IMPRESSION: No active cardiopulmonary disease. Electronically Signed   By: Dorise Bullion III M.D   On: 10/22/2016 16:48    Assessment/Plan  1. Chronic congestive heart failure, unspecified heart failure type ([place]) Despite the elevatd BNP, I do npot see that there is strong lcinical evidence of any worsening of CHF.  2. Edema, unspecified type improved  3. Orthostatic hypotension will  continue to adjust meds, but this is proving to be be a very difficult issue with wide swings in BP from significant hypertension to BP low enough to cause altered awareness and consciousness.  4. Parkinson's disease ([place]) Pressured speech, poor balance, autonomic dysfunction are all significant issues.  5. Secondary adrenal insufficiency ([facility]) Treated with mineralocorticoid, fludrocortisone, in the past, but it may have made the CHF worse, so they were stopped during the last hospital stay. I wlil resume them today to see if this will help the hypotensive episodes.

## 2016-11-20 NOTE — Telephone Encounter (Signed)
Refer to [place] message(patient Email) dated 11/20/16

## 2016-11-20 NOTE — Telephone Encounter (Signed)
I telephoned [place]. Florinef was stopped today and Lexapro started.

## 2016-11-24 DIAGNOSIS — R410 Disorientation, unspecified: Secondary | ICD-10-CM | POA: Diagnosis not present

## 2016-11-24 DIAGNOSIS — I951 Orthostatic hypotension: Secondary | ICD-10-CM | POA: Diagnosis not present

## 2016-11-24 DIAGNOSIS — J90 Pleural effusion, not elsewhere classified: Secondary | ICD-10-CM | POA: Diagnosis not present

## 2016-11-24 DIAGNOSIS — F458 Other somatoform disorders: Secondary | ICD-10-CM | POA: Diagnosis not present

## 2016-11-24 DIAGNOSIS — Z515 Encounter for palliative care: Secondary | ICD-10-CM | POA: Diagnosis not present

## 2016-11-24 DIAGNOSIS — E2749 Other adrenocortical insufficiency: Secondary | ICD-10-CM | POA: Diagnosis not present

## 2016-11-24 DIAGNOSIS — E2740 Unspecified adrenocortical insufficiency: Secondary | ICD-10-CM | POA: Diagnosis not present

## 2016-11-24 DIAGNOSIS — I5023 Acute on chronic systolic (congestive) heart failure: Secondary | ICD-10-CM | POA: Diagnosis not present

## 2016-11-24 DIAGNOSIS — Z7409 Other reduced mobility: Secondary | ICD-10-CM | POA: Diagnosis not present

## 2016-11-24 DIAGNOSIS — I351 Nonrheumatic aortic (valve) insufficiency: Secondary | ICD-10-CM | POA: Diagnosis not present

## 2016-11-24 DIAGNOSIS — Z9981 Dependence on supplemental oxygen: Secondary | ICD-10-CM | POA: Diagnosis not present

## 2016-11-24 DIAGNOSIS — Z8711 Personal history of peptic ulcer disease: Secondary | ICD-10-CM | POA: Diagnosis not present

## 2016-11-24 DIAGNOSIS — I5042 Chronic combined systolic (congestive) and diastolic (congestive) heart failure: Secondary | ICD-10-CM | POA: Diagnosis not present

## 2016-11-24 DIAGNOSIS — Z79899 Other long term (current) drug therapy: Secondary | ICD-10-CM | POA: Diagnosis not present

## 2016-11-24 DIAGNOSIS — E271 Primary adrenocortical insufficiency: Secondary | ICD-10-CM | POA: Diagnosis not present

## 2016-11-24 DIAGNOSIS — K219 Gastro-esophageal reflux disease without esophagitis: Secondary | ICD-10-CM | POA: Diagnosis present

## 2016-11-24 DIAGNOSIS — I110 Hypertensive heart disease with heart failure: Secondary | ICD-10-CM | POA: Diagnosis not present

## 2016-11-24 DIAGNOSIS — G908 Other disorders of autonomic nervous system: Secondary | ICD-10-CM | POA: Diagnosis not present

## 2016-11-24 DIAGNOSIS — I340 Nonrheumatic mitral (valve) insufficiency: Secondary | ICD-10-CM | POA: Diagnosis not present

## 2016-11-24 DIAGNOSIS — D539 Nutritional anemia, unspecified: Secondary | ICD-10-CM | POA: Diagnosis not present

## 2016-11-24 DIAGNOSIS — L89152 Pressure ulcer of sacral region, stage 2: Secondary | ICD-10-CM | POA: Diagnosis not present

## 2016-11-24 DIAGNOSIS — I674 Hypertensive encephalopathy: Secondary | ICD-10-CM | POA: Diagnosis present

## 2016-11-24 DIAGNOSIS — I5020 Unspecified systolic (congestive) heart failure: Secondary | ICD-10-CM | POA: Diagnosis not present

## 2016-11-24 DIAGNOSIS — I5022 Chronic systolic (congestive) heart failure: Secondary | ICD-10-CM | POA: Diagnosis not present

## 2016-11-24 DIAGNOSIS — I255 Ischemic cardiomyopathy: Secondary | ICD-10-CM | POA: Diagnosis present

## 2016-11-24 DIAGNOSIS — G20 Parkinson's disease: Secondary | ICD-10-CM | POA: Diagnosis present

## 2016-11-24 DIAGNOSIS — I361 Nonrheumatic tricuspid (valve) insufficiency: Secondary | ICD-10-CM | POA: Diagnosis not present

## 2016-11-24 DIAGNOSIS — G629 Polyneuropathy, unspecified: Secondary | ICD-10-CM | POA: Diagnosis present

## 2016-11-24 DIAGNOSIS — K5909 Other constipation: Secondary | ICD-10-CM | POA: Diagnosis present

## 2016-11-24 DIAGNOSIS — E559 Vitamin D deficiency, unspecified: Secondary | ICD-10-CM | POA: Diagnosis present

## 2016-11-24 DIAGNOSIS — I501 Left ventricular failure: Secondary | ICD-10-CM | POA: Diagnosis not present

## 2016-11-24 DIAGNOSIS — J811 Chronic pulmonary edema: Secondary | ICD-10-CM | POA: Diagnosis not present

## 2016-11-24 DIAGNOSIS — L89322 Pressure ulcer of left buttock, stage 2: Secondary | ICD-10-CM | POA: Diagnosis present

## 2016-11-24 DIAGNOSIS — Z7189 Other specified counseling: Secondary | ICD-10-CM | POA: Diagnosis not present

## 2016-11-24 DIAGNOSIS — I160 Hypertensive urgency: Secondary | ICD-10-CM | POA: Diagnosis not present

## 2016-11-24 DIAGNOSIS — J9601 Acute respiratory failure with hypoxia: Secondary | ICD-10-CM | POA: Diagnosis not present

## 2016-11-24 DIAGNOSIS — I5030 Unspecified diastolic (congestive) heart failure: Secondary | ICD-10-CM | POA: Diagnosis not present

## 2016-11-24 DIAGNOSIS — I161 Hypertensive emergency: Secondary | ICD-10-CM | POA: Diagnosis not present

## 2016-11-24 DIAGNOSIS — R918 Other nonspecific abnormal finding of lung field: Secondary | ICD-10-CM | POA: Diagnosis not present

## 2016-11-24 DIAGNOSIS — Z888 Allergy status to other drugs, medicaments and biological substances status: Secondary | ICD-10-CM | POA: Diagnosis not present

## 2016-11-24 DIAGNOSIS — I348 Other nonrheumatic mitral valve disorders: Secondary | ICD-10-CM | POA: Diagnosis not present

## 2016-11-24 DIAGNOSIS — R4182 Altered mental status, unspecified: Secondary | ICD-10-CM | POA: Diagnosis not present

## 2016-11-24 DIAGNOSIS — F0280 Dementia in other diseases classified elsewhere without behavioral disturbance: Secondary | ICD-10-CM | POA: Diagnosis not present

## 2016-11-24 DIAGNOSIS — G9340 Encephalopathy, unspecified: Secondary | ICD-10-CM | POA: Diagnosis not present

## 2016-11-24 DIAGNOSIS — R40241 Glasgow coma scale score 13-15, unspecified time: Secondary | ICD-10-CM | POA: Diagnosis present

## 2016-11-24 DIAGNOSIS — N400 Enlarged prostate without lower urinary tract symptoms: Secondary | ICD-10-CM | POA: Diagnosis present

## 2016-11-24 DIAGNOSIS — I509 Heart failure, unspecified: Secondary | ICD-10-CM | POA: Diagnosis not present

## 2016-11-24 DIAGNOSIS — R9431 Abnormal electrocardiogram [ECG] [EKG]: Secondary | ICD-10-CM | POA: Diagnosis not present

## 2016-11-24 DIAGNOSIS — T444X5A Adverse effect of predominantly alpha-adrenoreceptor agonists, initial encounter: Secondary | ICD-10-CM | POA: Diagnosis present

## 2016-11-24 DIAGNOSIS — E873 Alkalosis: Secondary | ICD-10-CM | POA: Diagnosis present

## 2016-11-24 DIAGNOSIS — L89312 Pressure ulcer of right buttock, stage 2: Secondary | ICD-10-CM | POA: Diagnosis not present

## 2016-11-24 DIAGNOSIS — Z66 Do not resuscitate: Secondary | ICD-10-CM | POA: Diagnosis present

## 2016-11-24 DIAGNOSIS — I517 Cardiomegaly: Secondary | ICD-10-CM | POA: Diagnosis not present

## 2016-11-26 ENCOUNTER — Encounter: Payer: Self-pay | Admitting: Neurology

## 2016-11-28 ENCOUNTER — Ambulatory Visit: Payer: Medicare Other | Admitting: Neurology

## 2016-11-30 ENCOUNTER — Ambulatory Visit: Payer: Medicare Other | Admitting: Endocrinology

## 2016-11-30 LAB — CBC AND DIFFERENTIAL
HCT: 27 % — AB (ref 41–53)
Hemoglobin: 9.2 g/dL — AB (ref 13.5–17.5)
Platelets: 190 10*3/uL (ref 150–399)
WBC: 9.8 10^3/mL

## 2016-11-30 LAB — BASIC METABOLIC PANEL
BUN: 28 mg/dL — AB (ref 4–21)
Creatinine: 1.2 mg/dL (ref 0.6–1.3)
Glucose: 95 mg/dL
POTASSIUM: 3.7 mmol/L (ref 3.4–5.3)
SODIUM: 143 mmol/L (ref 137–147)

## 2016-12-05 ENCOUNTER — Telehealth: Payer: Self-pay | Admitting: *Deleted

## 2016-12-05 NOTE — Telephone Encounter (Signed)
Son called and stated that patient was discharged from the hospital and was in for an impacted bowel. Son noticed at facility that patient is not on his usual bowel regimen of Metamucil with Water after every meal. Son wants this continued so patient does not get constipated again. Please Advise.

## 2016-12-06 ENCOUNTER — Non-Acute Institutional Stay (SKILLED_NURSING_FACILITY): Payer: Medicare Other | Admitting: Internal Medicine

## 2016-12-06 ENCOUNTER — Encounter: Payer: Self-pay | Admitting: Internal Medicine

## 2016-12-06 DIAGNOSIS — E440 Moderate protein-calorie malnutrition: Secondary | ICD-10-CM

## 2016-12-06 DIAGNOSIS — E271 Primary adrenocortical insufficiency: Secondary | ICD-10-CM

## 2016-12-06 DIAGNOSIS — L89152 Pressure ulcer of sacral region, stage 2: Secondary | ICD-10-CM | POA: Diagnosis not present

## 2016-12-06 DIAGNOSIS — E2749 Other adrenocortical insufficiency: Secondary | ICD-10-CM | POA: Diagnosis not present

## 2016-12-06 DIAGNOSIS — G20 Parkinson's disease: Secondary | ICD-10-CM | POA: Diagnosis not present

## 2016-12-06 DIAGNOSIS — I951 Orthostatic hypotension: Secondary | ICD-10-CM | POA: Diagnosis not present

## 2016-12-06 DIAGNOSIS — D649 Anemia, unspecified: Secondary | ICD-10-CM

## 2016-12-06 DIAGNOSIS — R609 Edema, unspecified: Secondary | ICD-10-CM

## 2016-12-06 DIAGNOSIS — G20A1 Parkinson's disease without dyskinesia, without mention of fluctuations: Secondary | ICD-10-CM

## 2016-12-06 DIAGNOSIS — I509 Heart failure, unspecified: Secondary | ICD-10-CM | POA: Diagnosis not present

## 2016-12-06 DIAGNOSIS — K5900 Constipation, unspecified: Secondary | ICD-10-CM | POA: Diagnosis not present

## 2016-12-06 NOTE — Telephone Encounter (Signed)
I added Miralax. Patient stated today when I examined him that he had a large BM.

## 2016-12-06 NOTE — Progress Notes (Signed)
History and Physical     Location:  [facility] Room Number: N36 Place of Service:  SNF (31)  PCP: Jeanmarie Hubert, MD Patient Care Team: Estill Dooms, MD as PCP - General (Internal Medicine) Man Otho Darner, NP as Nurse Practitioner (Nurse Practitioner) Kathrynn Ducking, MD as Consulting Physician (Neurology)  Extended Emergency Contact Information Primary Emergency Contact: Lucita Ferrara Address: [address]           [place], [place] [address] [name] of [place] Phone: [phone] Relation: Spouse Secondary Emergency Contact: Bondy,Clifford  [place] Phone: [phone] Relation: Son  Code Status: DNR Goals of Care: Advanced Directive information Advanced Directives 12/06/2016  Does Patient Have a Medical Advance Directive? Yes  Type of Paramedic of [place];Living will;Out of facility DNR (pink MOST or yellow form)  Does patient want to make changes to medical advance directive? -  Copy of [place] in Chart? Yes  Pre-existing out of facility DNR order (yellow form or pink MOST form) Yellow form placed in chart (order not valid for inpatient use)      Chief Complaint  Patient presents with  . Readmit To SNF    following hospitalization 11/24/16 to 11/30/16 [facility]. Altered mental status     HPI: Patient is a 83 y.o. male seen today for readmission to [facility] following admission to [facility] in [place] (son's wife works there). His son took him there following increased confusion and a rapid decline in Mental Status over the last few months. He was recognized as having Parkinson's Disease with autonomic insufficiency and chronic HFrEF. He was found to be in flash pulmonary edema and hypertensive emergency. He was known to have hypotension when attempting to get him in an upright posture. He was on midodrine to try to handle  this problem.  2D Echo showed LVEF 30-35%.  Known adrenal insufficiency with prior lab showing low cortisol and positive ACTH stim test was treated with Cortef 20 mg AM and 10 mg in afternoon.  Decubitus of the buttocks was treated with Mepilex.  Sinemet was increased to tid.  Chronic anemia continued with Hgb 9.2 at DC.  He is improved from his Pulmonary Edema, but is otherwise much the same as when I last saw him.   He was constipated on return to [facility], but had a large BM this morning. His son would like his usual laxative regimen resumed.  Past Medical History:  Diagnosis Date  . Abnormality of gait 10/19/2015  . Anemia, unspecified   . Benign neoplasm of colon    pre-cancerous polyps removed at colonoscopy 2008  . Contact dermatitis and other eczema, due to unspecified cause   . Diverticulosis of colon (without mention of hemorrhage)   . Esophageal reflux   . Hypertrophy of prostate without urinary obstruction and other lower urinary tract symptoms (LUTS)   . Mononeuritis of unspecified site   . Orthostatic hypotension 04/18/2016  . Osteoarthrosis, unspecified whether generalized or localized, pelvic region and thigh   . Other B-complex deficiencies   . Other pulmonary embolism and infarction    following cholecystectomy 2003  . Parkinson disease ([place]) 10/19/2015  . Peripheral neuropathy   . Pure hypercholesterolemia   . Rosacea   . Seizures ([place]) 04/17/2016  . 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 Syncope and collapse 04/18/2016  . Ulcer of foot ([place])  04/03/2016  . Unspecified constipation   . Unspecified essential hypertension   . Unspecified gastritis and gastroduodenitis without mention of hemorrhage 08/29/2006  . Unspecified hemorrhoids without mention of complication   . Unspecified vitamin D deficiency    Past Surgical History:  Procedure Laterality Date  . CATARACT EXTRACTION W/ INTRAOCULAR LENS IMPLANT Left 5/20//2013  . CHOLECYSTECTOMY,  LAPAROSCOPIC  2003  . colonic polyp removal  1965  . COLONOSCOPY  08/29/2006   hemorrhoids, 3 small polyps (hyperplastic) & diverticulosis  . Lillie    reports that he quit smoking about 53 years ago. His smoking use included Cigarettes. He quit after 15.00 years of use. He has never used smokeless tobacco. He reports that he drinks about 1.2 oz of alcohol per week . He reports that he does not use drugs. Social History   Social History  . Marital status: Married    Spouse name: [name]  . Number of children: 1  . Years of education: 79   Occupational History  . retired Advertising account executive    Social History Main Topics  . Smoking status: Former Smoker    Years: 15.00    Types: Cigarettes    Quit date: 01/30/1963  . Smokeless tobacco: Never Used  . Alcohol use 1.2 oz/week    1 Glasses of wine, 1 Cans of beer per week     Comment: 14 per week  . Drug use: No  . Sexual activity: No   Other Topics Concern  . Not on file   Social History Narrative   Lives at [facility] since 01/20/2014, 10/19/15 currently in assisted living area   Married [place]   Former smoker stopped 1964   Alcohlol 14 drinks per week   Exercise walks daily   Living Will, [place]             Functional Status Survey:    Family History  Problem Relation Age of Onset  . Cancer Mother     breast  . Pneumonia Father   . Cancer Brother     Health Maintenance  Topic Date Due  . Samul Dada  09/26/1944  . PNA vac Low Risk Adult (2 of 2 - PCV13) 06/12/2003  . INFLUENZA VACCINE  03/20/2017    Allergies  Allergen Reactions  . Lyrica [Pregabalin] Other (See Comments)    *Anticonvulsants*, fatique    Outpatient Encounter Prescriptions as of 12/06/2016  Medication Sig  . acetaminophen (TYLENOL) 325 MG tablet Take 325 mg by mouth. Take 2 tablets once daily; take 2 every 4 hours as needed for minor pain  . Amino Acids-Protein Hydrolys (FEEDING SUPPLEMENT, PRO-STAT SUGAR FREE 64,) LIQD Take 30 mLs  by mouth 3 (three) times daily with meals.  . carbidopa-levodopa (SINEMET IR) 25-100 MG tablet Take by mouth. Take one tablet three times a day  . Cobalamine Combinations (VITAMIN B12-FOLIC ACID) 128-786 MCG TABS Take by mouth. Take two tablets once a day  . escitalopram (LEXAPRO) 10 MG tablet Take 10 mg by mouth. Take one tablet daily  . famotidine (PEPCID) 20 MG tablet Take 20 mg by mouth daily.  . folic acid (FOLVITE) 767 MCG tablet Take 800 mcg by mouth. Take one tablet once a day  . furosemide (LASIX) 40 MG tablet Take 40 mg by mouth. Take one tablet once a day when the weight increases by 3 pounts  . hydrocortisone (CORTEF) 10 MG tablet Take 10 mg by mouth. Take one tablet twice a day  . ibuprofen (  ADVIL,MOTRIN) 400 MG tablet Take 1 tablet (400 mg total) by mouth every 6 (six) hours as needed for moderate pain.  Marland Kitchen loperamide (IMODIUM A-D) 2 MG tablet Take 2 mg by mouth. If 3 loose stools in 24 hours, hold all laxatives and stool softeners. Give Imodium AD 4 mg initial, then 2 mg after each loose stool for 48 hours  . magnesium oxide (MAG-OX) 400 (241.3 Mg) MG tablet Take 0.5 tablets (200 mg total) by mouth daily.  . megestrol (MEGACE) 400 MG/10ML suspension Take 200 mg by mouth daily.   . polyethylene glycol (MIRALAX / GLYCOLAX) packet Take 17 g by mouth daily as needed for mild constipation.  . potassium chloride SA (K-DUR,KLOR-CON) 20 MEQ tablet Take 20 mEq by mouth. Take one tablet daily for potassium  . therapeutic multivitamin-minerals (THERAGRAN-M) tablet Take 1 tablet by mouth daily.  Marland Kitchen zinc oxide 20 % ointment Apply 1 application topically 2 (two) times daily as needed for irritation.  . [DISCONTINUED] Multiple Vitamins-Minerals (THERA-M) TABS TAKE ONE TABLET BY MOUTH EVERY DAY (FORMULARY SUB. FOR DAILY VITE)  . [DISCONTINUED] carbidopa-levodopa (SINEMET CR) 50-200 MG tablet Take 1 tablet by mouth 3 (three) times daily.  . [DISCONTINUED] cyclobenzaprine (FLEXERIL) 5 MG tablet Take 1  tablet (5 mg total) by mouth 3 (three) times daily as needed for muscle spasms (muscle pain and stiffness).  . [DISCONTINUED] hydrocortisone (CORTEF) 10 MG tablet Take 1 tablet (10 mg total) by mouth at bedtime.  . [DISCONTINUED] hydrocortisone (CORTEF) 20 MG tablet Take 20 mg by mouth daily with breakfast.  . [DISCONTINUED] lactulose (CHRONULAC) 10 GM/15ML solution Take by mouth. Give 40 grams/60 mls daily for constipation once a day as needed  . [DISCONTINUED] midodrine (PROAMATINE) 10 MG tablet Take 10 mg by mouth. Take one tablet twice a day  . [DISCONTINUED] Nutritional Supplements (BOOST PLUS PO) Take 1 Can by mouth. One can two times a day with meals  . [DISCONTINUED] psyllium (HYDROCIL/METAMUCIL) 95 % PACK Take 1 packet by mouth daily.   No facility-administered encounter medications on file as of 12/06/2016.     Review of Systems  Constitutional: Positive for activity change and fatigue. Negative for chills, diaphoresis and fever.  HENT: Positive for voice change. Negative for congestion, ear pain and hearing loss.   Eyes: Negative for photophobia, pain, discharge and redness.  Respiratory: Positive for shortness of breath. Negative for cough and wheezing.   Cardiovascular: Positive for leg swelling (both lower legs ). Negative for chest pain and palpitations.       Postural drops in BP. Very high BP at times. Hx CHF  Gastrointestinal: Positive for constipation. Negative for abdominal pain, blood in stool, diarrhea and nausea.  Endocrine:       B12 deficiency on oral supplements Adrenal insufficiency  Genitourinary: Negative.   Musculoskeletal: Positive for arthralgias and gait problem. Negative for back pain, myalgias and neck pain.  Skin:       Multiple seborrheic keratoses.  Bilateral decubiti of the medial buttocks, Stage 2.  Neurological: Positive for tremors and weakness. Negative for seizures and headaches.       Complaint of loss of motor skills and changes in. He is  unstable with walking. He has a known peripheral neuropathy of uncertain origin. Micrographia. Pressured, soft speech that is nearly inaudible. History of Parkinsonism currently treated with Sinemet. Memory failing.  Hematological:       Anemia  Psychiatric/Behavioral: Positive for confusion and decreased concentration. The patient is nervous/anxious.  Staff and son report confabulation and confusion that fluctuates.    Vitals:   12/06/16 0918  BP: (!) 156/84  Pulse: 76  Resp: (!) 24  Temp: 99 F (37.2 C)  SpO2: 95%  Weight: 140 lb (63.5 kg)  Height: 5\' 10"  (1.778 m)   Body mass index is 20.09 kg/m. Physical Exam  Constitutional: He is oriented to person, place, and time.  Thin. Elderly. Chronically weak and tired.  HENT:  Right Ear: External ear normal.  Left Ear: External ear normal.  Nose: Nose normal.  Mouth/Throat: Oropharynx is clear and moist. No oropharyngeal exudate.  Eyes: Conjunctivae are normal. Pupils are equal, round, and reactive to light.  Neck: No JVD present. No tracheal deviation present. No thyromegaly present.  Cardiovascular: Normal rate, regular rhythm, normal heart sounds and intact distal pulses.  Exam reveals no gallop and no friction rub.   No murmur heard. Pulmonary/Chest: No respiratory distress. He has no wheezes. He has no rales. He exhibits no tenderness.  Abdominal: He exhibits distension (mild). He exhibits no mass. There is no tenderness.  Musculoskeletal: Normal range of motion. He exhibits edema (1+ ipedal) and tenderness.  unstable gait. Trace edema BLE. Pain with movement of the left hip and thigh  Lymphadenopathy:    He has no cervical adenopathy.  Neurological: He is alert and oriented to person, place, and time. He displays abnormal reflex (Absent at both knees). No cranial nerve deficit. Coordination abnormal.  Cogwheeling most evident in the right wrist. Speech is soft and mildly pressured. There is a mild tremor at rest.  There is no focal weakness. 1/14//15 MMSE 30/30. Passed clock drawing test.  Skin: No rash noted. No erythema. No pallor.  Healed corn at the plantar aspect of the the left 5th MTJ  Psychiatric:  Patient appears mildly anxious.     Labs reviewed: Basic Metabolic Panel:  Recent Labs  04/19/16 0434  10/08/16 1200 10/09/16 0536 10/10/16 0519 10/12/16 0441  10/22/16 1628 11/12/16 11/30/16  NA 141  < >  --  142 141 134*  < > 139 141 143  K 4.1  < >  --  3.4* 3.8 3.3*  < > 4.1 3.9 3.7  CL 111  < >  --  110 109 103  --  105  --   --   CO2 22  < >  --  24 25 25   --   --   --   --   GLUCOSE 96  < >  --  120* 111* 105*  --  126*  --   --   BUN 26*  < >  --  31* 29* 22*  < > 29* 39* 28*  CREATININE 1.16  < >  --  1.04 1.07 0.96  < > 1.10 1.1 1.2  CALCIUM 9.0  < >  --  8.5* 8.3* 8.1*  --   --   --   --   MG 2.1  --  1.9  --   --   --   --   --   --   --   < > = values in this interval not displayed. Liver Function Tests:  Recent Labs  05/08/16 1653  05/24/16 1500 08/23/16 10/07/16 1454 11/12/16  AST 27  < > 29 14 30 18   ALT 30  < > 10 6* 23 18  ALKPHOS 39  < > 39 26 37* 35  BILITOT 0.3  --  0.5  --  1.2  --   PROT 5.2*  --  5.6*  --  5.1*  --   ALBUMIN 3.2*  --  3.0*  --  2.9*  --   < > = values in this interval not displayed. No results for input(s): LIPASE, AMYLASE in the last 8760 hours. No results for input(s): AMMONIA in the last 8760 hours. CBC:  Recent Labs  04/23/16 0357 04/24/16 0654  10/10/16 0519 10/12/16 0441 10/22/16 10/22/16 1616 10/22/16 1628 11/30/16  WBC 9.3 10.0  < > 11.3* 14.1* 11.5  11.5 11.9*  --  9.8  NEUTROABS 7.2 8.0*  --   --   --   --  10.2*  --   --   HGB 10.4* 10.7*  < > 10.0* 11.3* 10.7*  10.7* 10.4* 9.2* 9.2*  HCT 31.0* 32.6*  < > 30.9* 34.4* 32*  32* 32.4* 27.0* 27*  MCV 94.2 94.8  < > 98.7 97.5  --  100.6*  --   --   PLT 164 149*  < > 156 170 173 146*  --  190  < > = values in this interval not displayed. Cardiac Enzymes:  Recent  Labs  10/07/16 2100 10/08/16 0312 10/08/16 0952  TROPONINI 0.08* 0.07* 0.07*   BNP: Invalid input(s): POCBNP No results found for: HGBA1C Lab Results  Component Value Date   TSH 0.918 10/07/2016   Lab Results  Component Value Date   [id number] 914 10/19/2015   No results found for: FOLATE No results found for: IRON, TIBC, FERRITIN  Imaging and Procedures obtained prior to SNF admission: Dg Chest 2 View  Result Date: 10/22/2016 CLINICAL DATA:  Shortness of breath. EXAM: CHEST  2 VIEW COMPARISON:  October 07, 2016 FINDINGS: Stable cardiomegaly. The hila, mediastinum, lungs, and pleura are otherwise unremarkable. IMPRESSION: No active cardiopulmonary disease. Electronically Signed   By: Dorise Bullion III M.D   On: 10/22/2016 16:48    Assessment/Plan 1. Chronic congestive heart failure, unspecified heart failure type [facility]) Cardiology consultant did not think additional diuretic was likely to help much.   2. Edema, unspecified type improved  3. Parkinson's disease ([place]) Continue Sinemet tid  4. Decubitus ulcer of sacral region, stage 2 Continue Hydrogel  5. Normocytic anemia -CBC  6. Orthostatic hypotension This may not improve much. He is off midodrine at this time.  7. Constipation, unspecified constipation type Metamucil prior to meals ans Miralax prn.  8. Secondary adrenal insufficiency ([place]) Treated with Cortef.  9. Malnutrition of moderate degree Continue to encourage intake. Calorically deficient at this time.

## 2016-12-07 ENCOUNTER — Telehealth: Payer: Self-pay

## 2016-12-07 ENCOUNTER — Encounter: Payer: Self-pay | Admitting: Internal Medicine

## 2016-12-07 DIAGNOSIS — I10 Essential (primary) hypertension: Secondary | ICD-10-CM | POA: Diagnosis not present

## 2016-12-07 DIAGNOSIS — I5042 Chronic combined systolic (congestive) and diastolic (congestive) heart failure: Secondary | ICD-10-CM | POA: Diagnosis not present

## 2016-12-07 DIAGNOSIS — G903 Multi-system degeneration of the autonomic nervous system: Secondary | ICD-10-CM | POA: Diagnosis not present

## 2016-12-07 NOTE — Telephone Encounter (Signed)
Lawrence Turner called on his father's behalf to inform Dr.Green that an order was given for patient to have miralax daily and [name] this [name] is too much/strong.   [name] prefers that his father take Miralax as needed and metamucil daily with meals.  Based on our medication list patient is on miralax daily as needed.  Please advise if this was changed at the facility to daily scheduled

## 2016-12-08 DIAGNOSIS — E271 Primary adrenocortical insufficiency: Secondary | ICD-10-CM | POA: Insufficient documentation

## 2016-12-08 DIAGNOSIS — Z7409 Other reduced mobility: Secondary | ICD-10-CM | POA: Insufficient documentation

## 2016-12-08 NOTE — Telephone Encounter (Signed)
Miralax was changed to daly when he was seen 12/06/16. I will contact the facility to Have our orders align with what Cliffrd desires.

## 2016-12-10 LAB — HEPATIC FUNCTION PANEL
ALK PHOS: 39 U/L (ref 25–125)
ALT: 15 U/L (ref 10–40)
AST: 17 U/L (ref 14–40)
Bilirubin, Total: 0.5 mg/dL

## 2016-12-10 LAB — BASIC METABOLIC PANEL
BUN: 45 mg/dL — AB (ref 4–21)
CREATININE: 1.5 mg/dL — AB (ref <=1.3)
Glucose: 92 mg/dL
POTASSIUM: 3.9 mmol/L (ref 3.4–5.3)
SODIUM: 145 mmol/L (ref 137–147)

## 2016-12-10 LAB — CBC AND DIFFERENTIAL
HCT: 25 % — AB (ref 41–53)
HEMOGLOBIN: 8.4 g/dL — AB (ref 13.5–17.5)
PLATELETS: 218 10*3/uL (ref 150–399)
WBC: 10.5 10*3/mL

## 2016-12-12 ENCOUNTER — Encounter: Payer: Self-pay | Admitting: Internal Medicine

## 2016-12-12 DIAGNOSIS — R531 Weakness: Secondary | ICD-10-CM | POA: Diagnosis not present

## 2016-12-14 ENCOUNTER — Encounter: Payer: Self-pay | Admitting: Neurology

## 2016-12-14 ENCOUNTER — Other Ambulatory Visit: Payer: Self-pay | Admitting: *Deleted

## 2016-12-18 ENCOUNTER — Encounter: Payer: Self-pay | Admitting: Internal Medicine

## 2016-12-20 ENCOUNTER — Encounter: Payer: Self-pay | Admitting: Internal Medicine

## 2016-12-20 ENCOUNTER — Non-Acute Institutional Stay (SKILLED_NURSING_FACILITY): Payer: Medicare Other | Admitting: Internal Medicine

## 2016-12-20 DIAGNOSIS — L89152 Pressure ulcer of sacral region, stage 2: Secondary | ICD-10-CM | POA: Diagnosis not present

## 2016-12-20 LAB — BASIC METABOLIC PANEL
BUN: 40 mg/dL — AB (ref 4–21)
Creatinine: 1.6 mg/dL — AB (ref <=1.3)
GLUCOSE: 78 mg/dL
Potassium: 4.5 mmol/L (ref 3.4–5.3)
Sodium: 149 mmol/L — AB (ref 137–147)

## 2016-12-20 LAB — CBC AND DIFFERENTIAL
HCT: 23 % — AB (ref 41–53)
HEMOGLOBIN: 7.6 g/dL — AB (ref 13.5–17.5)
Platelets: 200 10*3/uL (ref 150–399)
WBC: 10.2 10^3/mL

## 2016-12-20 NOTE — Progress Notes (Signed)
Progress Note    Location:  [facility] Room Number: N36 Place of Service:  SNF [id number]) Provider:  Jeanmarie Hubert, MD  Patient Care Team: Estill Dooms, MD as PCP - General (Internal Medicine) Man Otho Darner, NP as Nurse Practitioner (Nurse Practitioner) Kathrynn Ducking, MD as Consulting Physician (Neurology)  Extended Emergency Contact Information Primary Emergency Contact: Lucita Ferrara Address: [address]           [place], [place] [address] [name] of [place] Phone: [phone] Relation: Spouse Secondary Emergency Contact: Williard,Clifford  [place] Mobile Phone: [phone] Relation: Son  Code Status:  DNR Goals of care: Advanced Directive information Advanced Directives 12/20/2016  Does Patient Have a Medical Advance Directive? Yes  Type of Paramedic of [place];Living will;Out of facility DNR (pink MOST or yellow form)  Does patient want to make changes to medical advance directive? -  Copy of [place] in Chart? Yes  Pre-existing out of facility DNR order (yellow form or pink MOST form) Yellow form placed in chart (order not valid for inpatient use);Pink MOST form placed in chart (order not valid for inpatient use)     Chief Complaint  Patient presents with  . Acute Visit    sacral decubitus    HPI:  Pt is a 83 y.o. male seen today for an acute visit for evaluation of his sacral decubitus. He has been complaining of more pain. No significant bleeding. He is sliding aroud his recliner chair.   Past Medical History:  Diagnosis Date  . Abnormality of gait 10/19/2015  . Anemia, unspecified   . Benign neoplasm of colon    pre-cancerous polyps removed at colonoscopy 2008  . Contact dermatitis and other eczema, due to unspecified cause   . Diverticulosis of colon (without mention of hemorrhage)   . Esophageal reflux   . Hypertrophy of prostate without urinary obstruction  and other lower urinary tract symptoms (LUTS)   . Mononeuritis of unspecified site   . Orthostatic hypotension 04/18/2016  . Osteoarthrosis, unspecified whether generalized or localized, pelvic region and thigh   . Other B-complex deficiencies   . Other pulmonary embolism and infarction    following cholecystectomy 2003  . Parkinson disease ([place]) 10/19/2015  . Peripheral neuropathy   . Pure hypercholesterolemia   . Rosacea   . Seizures ([place]) 04/17/2016  . 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 Syncope and collapse 04/18/2016  . Ulcer of foot ([place]) 04/03/2016  . Unspecified constipation   . Unspecified essential hypertension   . Unspecified gastritis and gastroduodenitis without mention of hemorrhage 08/29/2006  . Unspecified hemorrhoids without mention of complication   . Unspecified vitamin D deficiency    Past Surgical History:  Procedure Laterality Date  . CATARACT EXTRACTION W/ INTRAOCULAR LENS IMPLANT Left 5/20//2013  . CHOLECYSTECTOMY, LAPAROSCOPIC  2003  . colonic polyp removal  1965  . COLONOSCOPY  08/29/2006   hemorrhoids, 3 small polyps (hyperplastic) & diverticulosis  . HEMORRHOID SURGERY  1953    Allergies  Allergen Reactions  . Lyrica [Pregabalin] Other (See Comments)    *Anticonvulsants*, fatique  . Gabapentin Swelling    Outpatient Encounter Prescriptions as of 12/20/2016  Medication Sig  . acetaminophen (TYLENOL) 325 MG tablet Take 325 mg by mouth. Take 2 tablets once daily; take 2 every 4 hours as needed for minor pain  . Amino Acids-Protein Hydrolys (FEEDING SUPPLEMENT, PRO-STAT SUGAR FREE 64,)  LIQD Take 30 mLs by mouth 3 (three) times daily with meals.  . carbidopa-levodopa (SINEMET IR) 25-100 MG tablet Take by mouth. Take one tablet three times a day  . Cobalamine Combinations (VITAMIN B12-FOLIC ACID) 628-315 MCG TABS Take by mouth. Take two tablets once a day  . escitalopram (LEXAPRO) 10 MG tablet Take 10 mg by mouth. Take one tablet  daily  . famotidine (PEPCID) 20 MG tablet Take 20 mg by mouth daily.  . folic acid (FOLVITE) 176 MCG tablet Take 800 mcg by mouth. Take one tablet once a day  . furosemide (LASIX) 40 MG tablet Take 40 mg by mouth. Take one tablet once a day when the weight increases by 3 pounts  . hydrocortisone (CORTEF) 10 MG tablet Take 10 mg by mouth. Take one tablet twice a day  . ibuprofen (ADVIL,MOTRIN) 400 MG tablet Take 1 tablet (400 mg total) by mouth every 6 (six) hours as needed for moderate pain.  . magnesium oxide (MAG-OX) 400 (241.3 Mg) MG tablet Take 0.5 tablets (200 mg total) by mouth daily.  . megestrol (MEGACE) 400 MG/10ML suspension Take 200 mg by mouth daily.   . midodrine (PROAMATINE) 5 MG tablet Take 5 mg by mouth. Take one tablet every morning while awake  . polyethylene glycol (MIRALAX / GLYCOLAX) packet Take 17 g by mouth daily as needed for mild constipation.  . potassium chloride SA (K-DUR,KLOR-CON) 20 MEQ tablet Take 20 mEq by mouth. Take one tablet daily for potassium  . Skin Protectants, Misc. (NO-STING SKIN-PREP EX) Apply topically. Apply to each heel at bedtime  . therapeutic multivitamin-minerals (THERAGRAN-M) tablet Take 1 tablet by mouth daily.  Marland Kitchen zinc oxide 20 % ointment Apply 1 application topically 2 (two) times daily as needed for irritation.  . [DISCONTINUED] loperamide (IMODIUM A-D) 2 MG tablet Take 2 mg by mouth. If 3 loose stools in 24 hours, hold all laxatives and stool softeners. Give Imodium AD 4 mg initial, then 2 mg after each loose stool for 48 hours   No facility-administered encounter medications on file as of 12/20/2016.     Review of Systems  Constitutional: Positive for activity change and fatigue. Negative for chills, diaphoresis and fever.  HENT: Positive for voice change. Negative for congestion, ear pain and hearing loss.   Eyes: Negative for photophobia, pain, discharge and redness.  Respiratory: Positive for shortness of breath. Negative for cough and  wheezing.   Cardiovascular: Positive for leg swelling (both lower legs ). Negative for chest pain and palpitations.       Postural drops in BP. Very high BP at times. Hx CHF  Gastrointestinal: Positive for constipation. Negative for abdominal pain, blood in stool, diarrhea and nausea.  Endocrine:       B12 deficiency on oral supplements Adrenal insufficiency  Genitourinary: Negative.   Musculoskeletal: Positive for arthralgias and gait problem. Negative for back pain, myalgias and neck pain.  Skin:       Multiple seborrheic keratoses.  Bilateral decubiti of the medial buttocks, Stage 2. Painful.  Neurological: Positive for tremors and weakness. Negative for seizures and headaches.       Complaint of loss of motor skills and changes in. He is unstable with walking. He has a known peripheral neuropathy of uncertain origin. Micrographia. Pressured, soft speech that is nearly inaudible. History of Parkinsonism currently treated with Sinemet. Memory failing.  Hematological:       Anemia  Psychiatric/Behavioral: Positive for confusion and decreased concentration. The patient is nervous/anxious.  Staff and son report confabulation and confusion that fluctuates.    Immunization History  Administered Date(s) Administered  . DTaP 09/01/2007  . Influenza-Unspecified 07/04/2014, 05/19/2015, 06/01/2016  . Pneumococcal Polysaccharide-23 06/11/2002   Pertinent  Health Maintenance Due  Topic Date Due  . PNA vac Low Risk Adult (2 of 2 - PCV13) 06/12/2003  . INFLUENZA VACCINE  03/20/2017   Fall Risk  04/03/2016 02/14/2016 01/24/2016 01/24/2016 10/25/2015  Falls in the past year? No No Yes No No  Number falls in past yr: - - 2 or more - -  Injury with Fall? - - No - -  Risk Factor Category  - - - - -  Risk for fall due to : - - - - -  Risk for fall due to (comments): - - - - -  Follow up - - - - -   Functional Status Survey:    Vitals:   12/20/16 1645  BP: 118/67  Pulse: 92  Resp: 12    Temp: 97.9 F (36.6 C)  SpO2: 98%  Weight: 147 lb (66.7 kg)  Height: 5\' 10"  (1.778 m)   Body mass index is 21.09 kg/m. Physical Exam  Constitutional: He is oriented to person, place, and time.  Thin. Elderly. Chronically weak and tired.  HENT:  Right Ear: External ear normal.  Left Ear: External ear normal.  Nose: Nose normal.  Mouth/Throat: Oropharynx is clear and moist. No oropharyngeal exudate.  Eyes: Conjunctivae are normal. Pupils are equal, round, and reactive to light.  Neck: No JVD present. No tracheal deviation present. No thyromegaly present.  Cardiovascular: Normal rate, regular rhythm, normal heart sounds and intact distal pulses.  Exam reveals no gallop and no friction rub.   No murmur heard. Pulmonary/Chest: No respiratory distress. He has no wheezes. He has no rales. He exhibits no tenderness.  Abdominal: He exhibits distension (mild). He exhibits no mass. There is no tenderness.  Musculoskeletal: Normal range of motion. He exhibits edema (1+ ipedal) and tenderness.  unstable gait. Trace edema BLE. Pain with movement of the left hip and thigh  Lymphadenopathy:    He has no cervical adenopathy.  Neurological: He is alert and oriented to person, place, and time. He displays abnormal reflex (Absent at both knees). No cranial nerve deficit. Coordination abnormal.  Cogwheeling most evident in the right wrist. Speech is soft and mildly pressured. There is a mild tremor at rest. There is no focal weakness. 1/14//15 MMSE 30/30. Passed clock drawing test.  Skin: No rash noted. No erythema. No pallor.  Stage 2 sacral decubiti.  Psychiatric:  Patient appears mildly anxious.     Labs reviewed:  Recent Labs  04/19/16 0434  10/08/16 1200 10/09/16 0536 10/10/16 0519 10/12/16 0441  10/22/16 1628 11/12/16 11/30/16 12/10/16  NA 141  < >  --  142 141 134*  < > 139 141 143 145  K 4.1  < >  --  3.4* 3.8 3.3*  < > 4.1 3.9 3.7 3.9  CL 111  < >  --  110 109 103  --  105  --    --   --   CO2 22  < >  --  24 25 25   --   --   --   --   --   GLUCOSE 96  < >  --  120* 111* 105*  --  126*  --   --   --   BUN 26*  < >  --  31* 29* 22*  < > 29* 39* 28* 45*  CREATININE 1.16  < >  --  1.04 1.07 0.96  < > 1.10 1.1 1.2 1.5*  CALCIUM 9.0  < >  --  8.5* 8.3* 8.1*  --   --   --   --   --   MG 2.1  --  1.9  --   --   --   --   --   --   --   --   < > = values in this interval not displayed.  Recent Labs  05/08/16 1653  05/24/16 1500  10/07/16 1454 11/12/16 12/10/16  AST 27  < > 29  < > 30 18 17   ALT 30  < > 10  < > 23 18 15   ALKPHOS 39  < > 39  < > 37* 35 39  BILITOT 0.3  --  0.5  --  1.2  --   --   PROT 5.2*  --  5.6*  --  5.1*  --   --   ALBUMIN 3.2*  --  3.0*  --  2.9*  --   --   < > = values in this interval not displayed.  Recent Labs  04/23/16 0357 04/24/16 0654  10/10/16 0519 10/12/16 0441  10/22/16 1616 10/22/16 1628 11/30/16 12/10/16  WBC 9.3 10.0  < > 11.3* 14.1*  < > 11.9*  --  9.8 10.5  NEUTROABS 7.2 8.0*  --   --   --   --  10.2*  --   --   --   HGB 10.4* 10.7*  < > 10.0* 11.3*  < > 10.4* 9.2* 9.2* 8.4*  HCT 31.0* 32.6*  < > 30.9* 34.4*  < > 32.4* 27.0* 27* 25*  MCV 94.2 94.8  < > 98.7 97.5  --  100.6*  --   --   --   PLT 164 149*  < > 156 170  < > 146*  --  190 218  < > = values in this interval not displayed. Lab Results  Component Value Date   TSH 0.918 10/07/2016   No results found for: HGBA1C Lab Results  Component Value Date   CHOL 166 05/24/2014   HDL 45 05/24/2014   LDLCALC 110 05/24/2014   TRIG 54 05/24/2014   Assessment/Plan.diagm 1. Decubitus ulcer of sacral region, stage 2 Ordered additional pain medication. Discussed with his son. Patient needs cushion for his recliner and needs to sit leaning forward to take pressure off the ulcers. With his declining condition,  I think it will be difficult to achieve much pressure relief as long as he is spending much time in his recliner.

## 2016-12-21 ENCOUNTER — Other Ambulatory Visit: Payer: Self-pay | Admitting: *Deleted

## 2016-12-27 DIAGNOSIS — R531 Weakness: Secondary | ICD-10-CM | POA: Diagnosis not present

## 2016-12-28 ENCOUNTER — Encounter: Payer: Self-pay | Admitting: Nurse Practitioner

## 2016-12-28 ENCOUNTER — Encounter: Payer: Self-pay | Admitting: *Deleted

## 2016-12-28 DIAGNOSIS — R627 Adult failure to thrive: Secondary | ICD-10-CM | POA: Insufficient documentation

## 2017-01-08 ENCOUNTER — Encounter: Payer: Self-pay | Admitting: Internal Medicine

## 2017-01-08 ENCOUNTER — Telehealth: Payer: Self-pay | Admitting: *Deleted

## 2017-01-08 NOTE — Telephone Encounter (Signed)
Son was called by Nursing supervisor Daiva Eves Mahala), she explained to him that he was notified and it was all documented in his father's chart.

## 2017-01-18 DEATH — deceased

## 2017-06-08 IMAGING — CR DG CHEST 2V
2 series · 2 of 2 positions shown · non-contrast
Comparison: October 07, 2016

CLINICAL DATA: Shortness of breath.

EXAM:
CHEST  2 VIEW

[chest lat]
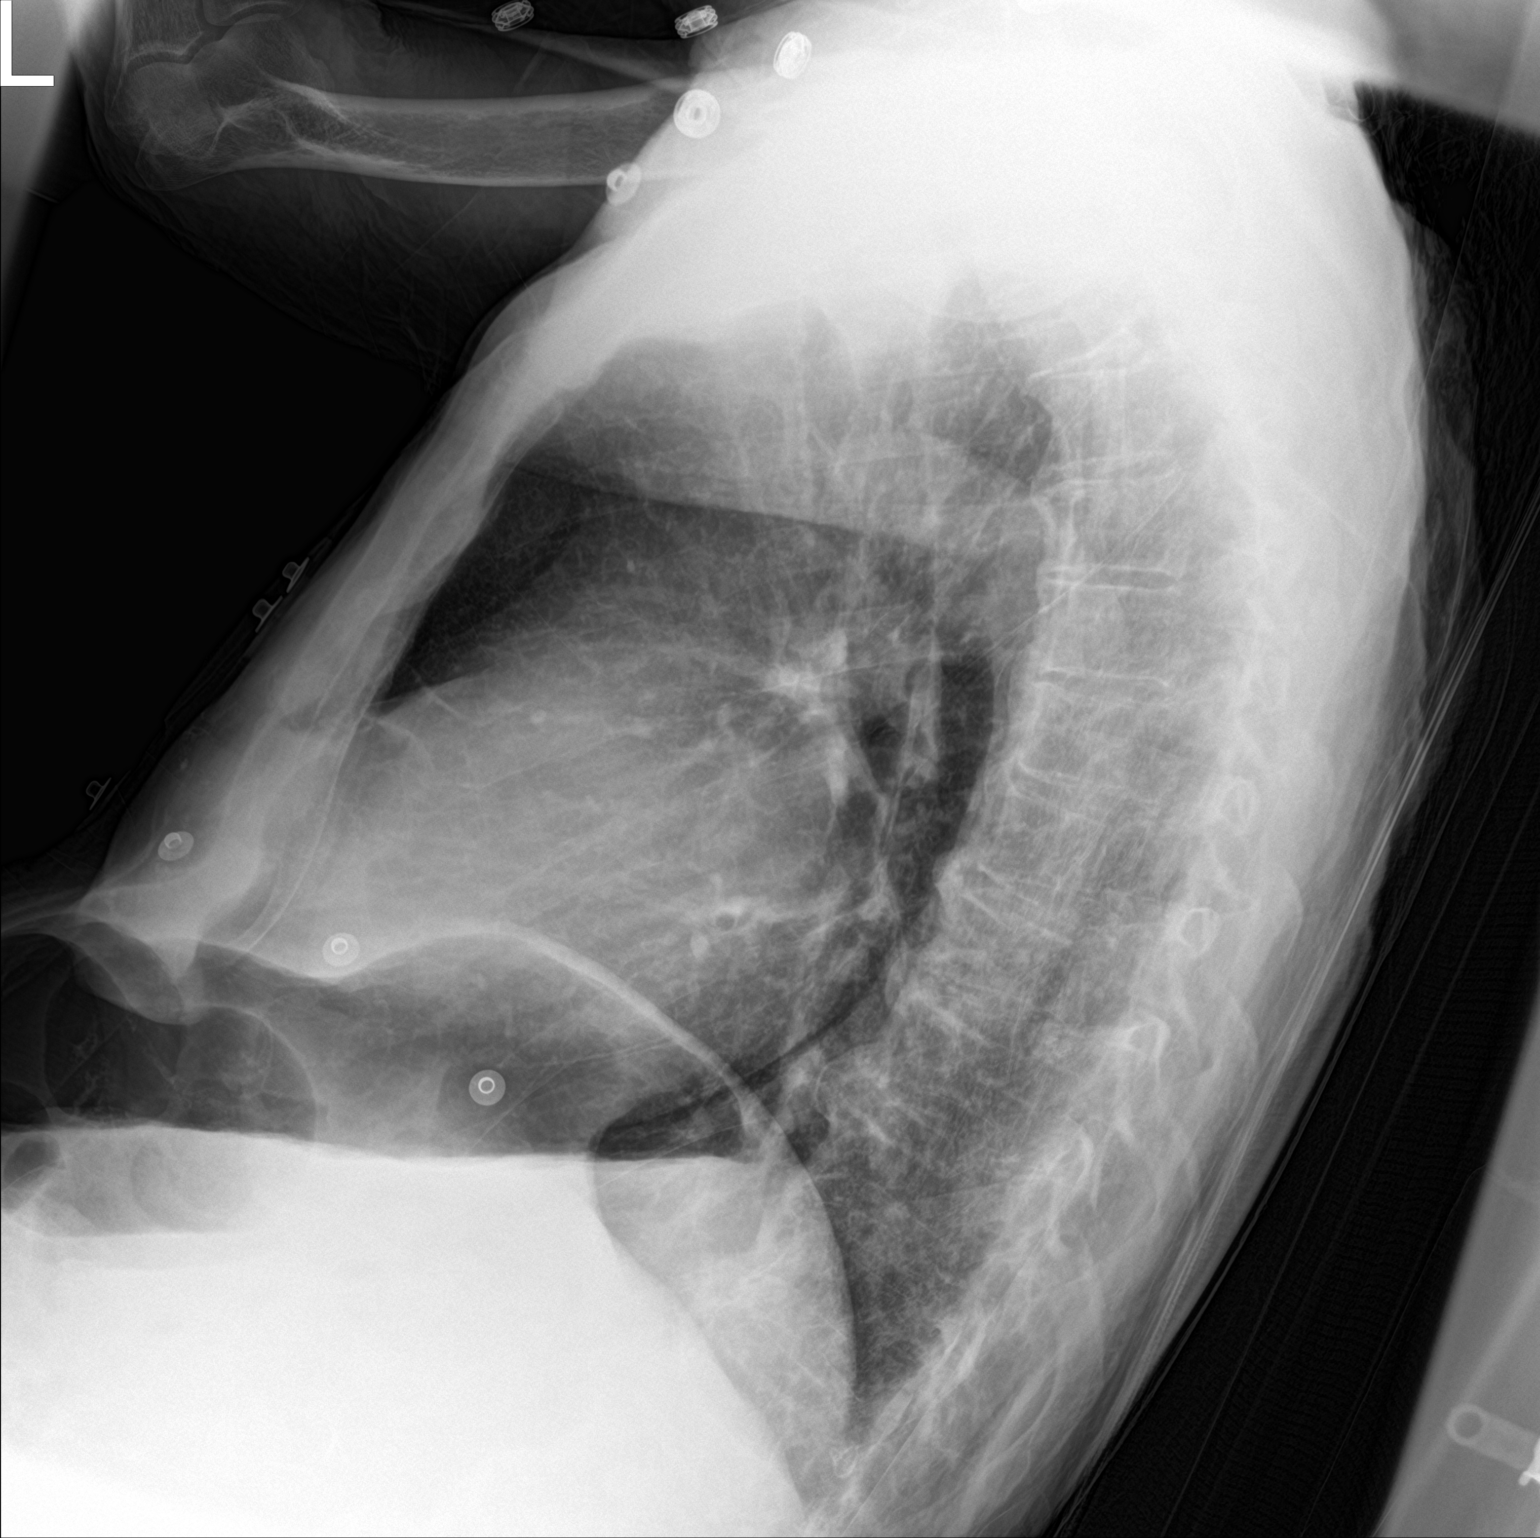

[chest ap]
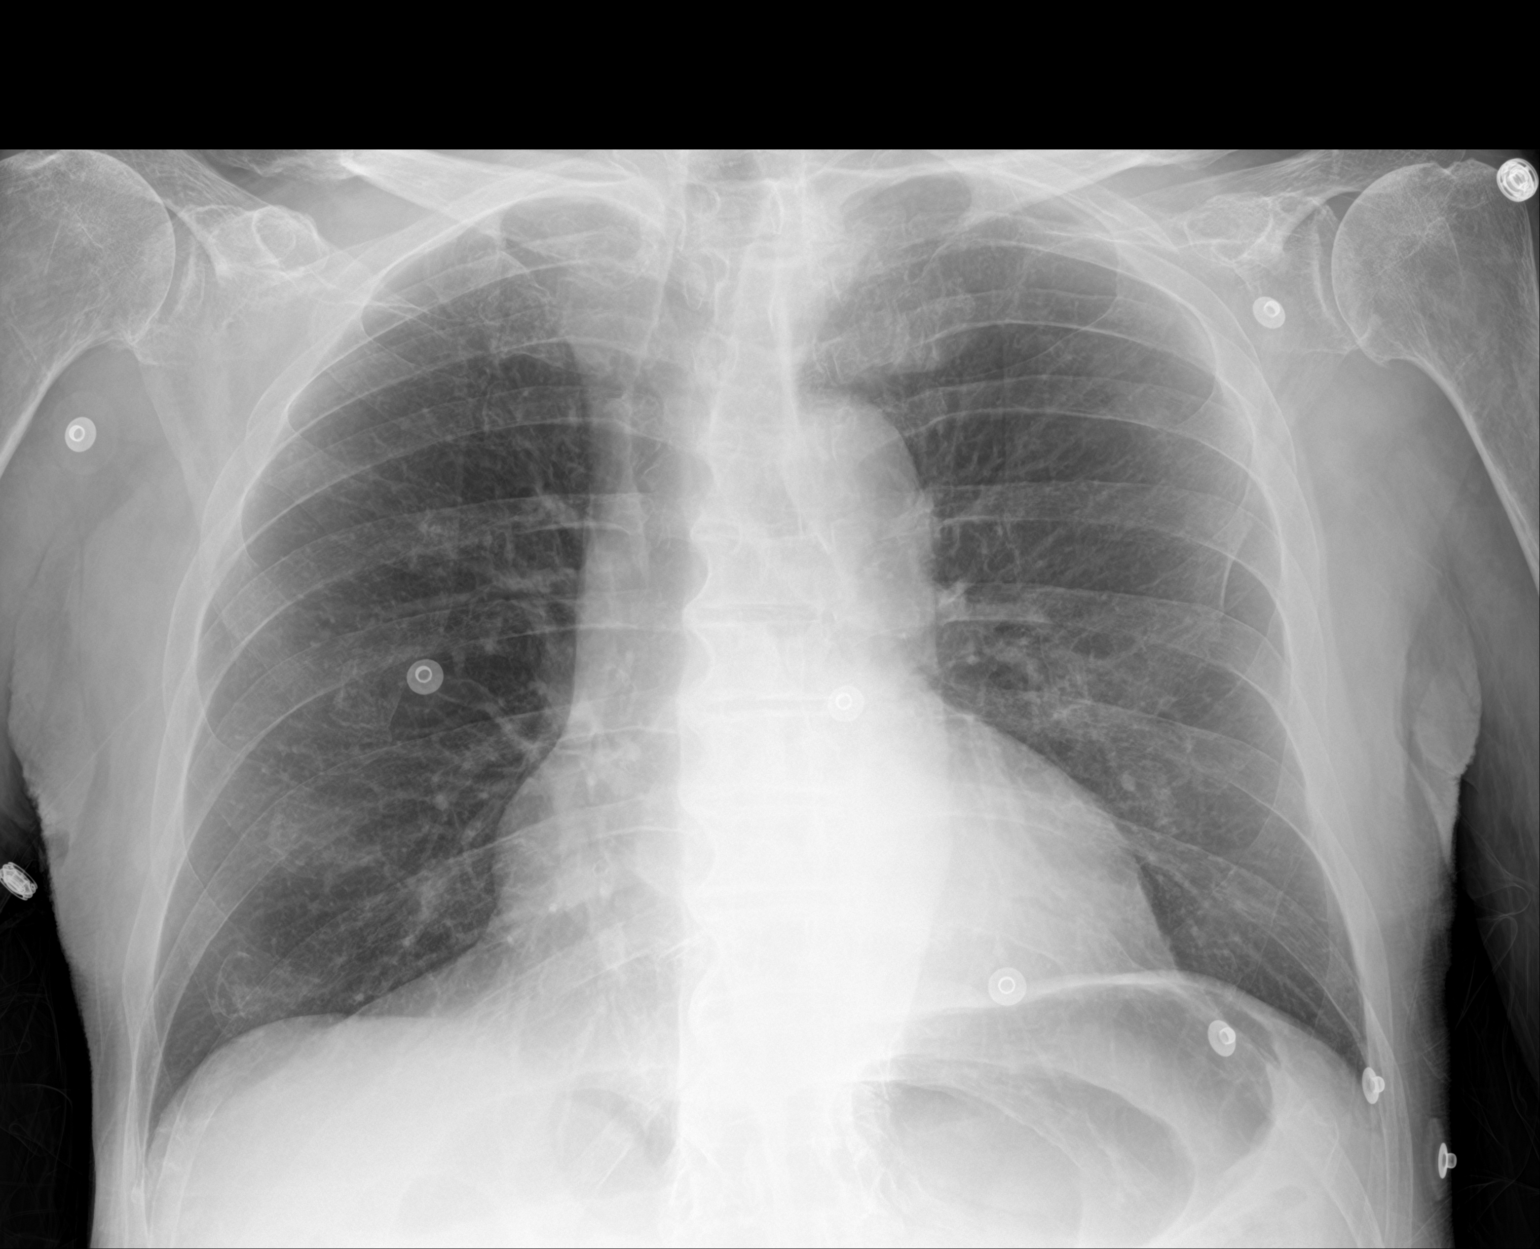

[2 of 2 positions shown; findings below may reference images not displayed]

FINDINGS: Stable cardiomegaly. The hila, mediastinum, lungs, and pleura are
otherwise unremarkable.
IMPRESSION: No active cardiopulmonary disease.

## 2018-04-02 ENCOUNTER — Encounter: Payer: Self-pay | Admitting: Internal Medicine
